# Patient Record
Sex: Male | Born: 1945 | Race: White | Hispanic: No | Marital: Married | Smoking: Former smoker
Health system: Southern US, Community
[De-identification: ages and names within clinical notes are randomized; demographics above are authoritative.]

## PROBLEM LIST (undated history)

## (undated) DIAGNOSIS — J309 Allergic rhinitis, unspecified: Secondary | ICD-10-CM

## (undated) DIAGNOSIS — G20 Parkinson's disease: Secondary | ICD-10-CM

## (undated) DIAGNOSIS — K439 Ventral hernia without obstruction or gangrene: Secondary | ICD-10-CM

## (undated) DIAGNOSIS — F32A Depression, unspecified: Secondary | ICD-10-CM

## (undated) DIAGNOSIS — I639 Cerebral infarction, unspecified: Secondary | ICD-10-CM

## (undated) DIAGNOSIS — I2510 Atherosclerotic heart disease of native coronary artery without angina pectoris: Secondary | ICD-10-CM

## (undated) DIAGNOSIS — E7800 Pure hypercholesterolemia, unspecified: Secondary | ICD-10-CM

## (undated) DIAGNOSIS — G20C Parkinsonism, unspecified: Secondary | ICD-10-CM

## (undated) DIAGNOSIS — R413 Other amnesia: Secondary | ICD-10-CM

## (undated) DIAGNOSIS — K219 Gastro-esophageal reflux disease without esophagitis: Secondary | ICD-10-CM

## (undated) DIAGNOSIS — E291 Testicular hypofunction: Secondary | ICD-10-CM

## (undated) DIAGNOSIS — G5790 Unspecified mononeuropathy of unspecified lower limb: Secondary | ICD-10-CM

## (undated) DIAGNOSIS — F329 Major depressive disorder, single episode, unspecified: Secondary | ICD-10-CM

## (undated) DIAGNOSIS — M1990 Unspecified osteoarthritis, unspecified site: Secondary | ICD-10-CM

## (undated) DIAGNOSIS — H47011 Ischemic optic neuropathy, right eye: Secondary | ICD-10-CM

## (undated) DIAGNOSIS — N400 Enlarged prostate without lower urinary tract symptoms: Secondary | ICD-10-CM

## (undated) DIAGNOSIS — I10 Essential (primary) hypertension: Secondary | ICD-10-CM

## (undated) DIAGNOSIS — G20A1 Parkinson's disease without dyskinesia, without mention of fluctuations: Secondary | ICD-10-CM

## (undated) HISTORY — DX: Parkinson's disease: G20

## (undated) HISTORY — DX: Allergic rhinitis, unspecified: J30.9

## (undated) HISTORY — DX: Ischemic optic neuropathy, right eye: H47.011

## (undated) HISTORY — DX: Pure hypercholesterolemia, unspecified: E78.00

## (undated) HISTORY — DX: Parkinson's disease without dyskinesia, without mention of fluctuations: G20.A1

## (undated) HISTORY — DX: Testicular hypofunction: E29.1

## (undated) HISTORY — DX: Essential (primary) hypertension: I10

## (undated) HISTORY — DX: Atherosclerotic heart disease of native coronary artery without angina pectoris: I25.10

## (undated) HISTORY — DX: Gastro-esophageal reflux disease without esophagitis: K21.9

## (undated) HISTORY — DX: Major depressive disorder, single episode, unspecified: F32.9

## (undated) HISTORY — DX: Benign prostatic hyperplasia without lower urinary tract symptoms: N40.0

## (undated) HISTORY — DX: Unspecified mononeuropathy of unspecified lower limb: G57.90

## (undated) HISTORY — DX: Unspecified osteoarthritis, unspecified site: M19.90

## (undated) HISTORY — DX: Depression, unspecified: F32.A

## (undated) HISTORY — DX: Ventral hernia without obstruction or gangrene: K43.9

## (undated) HISTORY — DX: Parkinsonism, unspecified: G20.C

## (undated) HISTORY — DX: Other amnesia: R41.3

---

## 2001-02-26 HISTORY — PX: CORONARY STENT PLACEMENT: SHX1402

## 2008-12-28 ENCOUNTER — Encounter: Admission: RE | Admit: 2008-12-28 | Discharge: 2009-02-23 | Payer: Self-pay | Admitting: Family Medicine

## 2009-02-26 HISTORY — PX: SHOULDER SURGERY: SHX246

## 2009-03-07 ENCOUNTER — Ambulatory Visit (HOSPITAL_COMMUNITY): Admission: RE | Admit: 2009-03-07 | Discharge: 2009-03-07 | Payer: Self-pay | Admitting: Orthopedic Surgery

## 2009-04-11 ENCOUNTER — Encounter: Payer: Self-pay | Admitting: Cardiovascular Disease

## 2010-01-11 ENCOUNTER — Emergency Department (HOSPITAL_COMMUNITY): Admission: EM | Admit: 2010-01-11 | Discharge: 2010-01-11 | Payer: Self-pay | Admitting: Emergency Medicine

## 2010-03-27 ENCOUNTER — Telehealth (INDEPENDENT_AMBULATORY_CARE_PROVIDER_SITE_OTHER): Payer: Self-pay | Admitting: *Deleted

## 2010-04-03 ENCOUNTER — Encounter: Payer: Self-pay | Admitting: Cardiovascular Disease

## 2010-04-03 DIAGNOSIS — E7800 Pure hypercholesterolemia, unspecified: Secondary | ICD-10-CM | POA: Insufficient documentation

## 2010-04-03 DIAGNOSIS — K449 Diaphragmatic hernia without obstruction or gangrene: Secondary | ICD-10-CM | POA: Insufficient documentation

## 2010-04-03 DIAGNOSIS — I2510 Atherosclerotic heart disease of native coronary artery without angina pectoris: Secondary | ICD-10-CM | POA: Insufficient documentation

## 2010-04-03 DIAGNOSIS — I10 Essential (primary) hypertension: Secondary | ICD-10-CM | POA: Insufficient documentation

## 2010-04-03 DIAGNOSIS — K439 Ventral hernia without obstruction or gangrene: Secondary | ICD-10-CM | POA: Insufficient documentation

## 2010-04-03 DIAGNOSIS — E1149 Type 2 diabetes mellitus with other diabetic neurological complication: Secondary | ICD-10-CM | POA: Insufficient documentation

## 2010-04-03 DIAGNOSIS — K219 Gastro-esophageal reflux disease without esophagitis: Secondary | ICD-10-CM | POA: Insufficient documentation

## 2010-04-03 DIAGNOSIS — J45909 Unspecified asthma, uncomplicated: Secondary | ICD-10-CM | POA: Insufficient documentation

## 2010-04-03 DIAGNOSIS — R413 Other amnesia: Secondary | ICD-10-CM | POA: Insufficient documentation

## 2010-04-03 DIAGNOSIS — N400 Enlarged prostate without lower urinary tract symptoms: Secondary | ICD-10-CM | POA: Insufficient documentation

## 2010-04-03 DIAGNOSIS — M1990 Unspecified osteoarthritis, unspecified site: Secondary | ICD-10-CM | POA: Insufficient documentation

## 2010-04-04 ENCOUNTER — Encounter: Payer: Self-pay | Admitting: Cardiovascular Disease

## 2010-04-04 ENCOUNTER — Ambulatory Visit (INDEPENDENT_AMBULATORY_CARE_PROVIDER_SITE_OTHER): Payer: Medicare Other | Admitting: Cardiovascular Disease

## 2010-04-04 DIAGNOSIS — I10 Essential (primary) hypertension: Secondary | ICD-10-CM

## 2010-04-04 DIAGNOSIS — E785 Hyperlipidemia, unspecified: Secondary | ICD-10-CM

## 2010-04-04 DIAGNOSIS — I2510 Atherosclerotic heart disease of native coronary artery without angina pectoris: Secondary | ICD-10-CM

## 2010-04-05 NOTE — Progress Notes (Signed)
  ROI faxed to [facility] @ [phone] attn Suella Broad Mesiemore  March 27, 2010 10:42 AM     Appended Document:  Records received back form [facility] gave to [place]

## 2010-04-10 ENCOUNTER — Telehealth (INDEPENDENT_AMBULATORY_CARE_PROVIDER_SITE_OTHER): Payer: Self-pay | Admitting: *Deleted

## 2010-04-11 ENCOUNTER — Encounter: Payer: Self-pay | Admitting: Cardiology

## 2010-04-11 ENCOUNTER — Ambulatory Visit (HOSPITAL_COMMUNITY): Payer: Medicare Other | Attending: Cardiovascular Disease

## 2010-04-11 DIAGNOSIS — I2510 Atherosclerotic heart disease of native coronary artery without angina pectoris: Secondary | ICD-10-CM

## 2010-04-13 ENCOUNTER — Telehealth (INDEPENDENT_AMBULATORY_CARE_PROVIDER_SITE_OTHER): Payer: Self-pay | Admitting: *Deleted

## 2010-04-13 NOTE — Assessment & Plan Note (Signed)
Summary: CARDIAC EVAL/ PT WIFE IS A PT OF Joseandres Mazer/ CT   History of Present Illness: Referred from Dr Clelia Croft at [facility].  Reviewed extensive records from there.  Admitted 2005 for angina.  Had cypher DES to LAD.  No ETT since.  CRF;s include borderline DM,HTN.  previous smoker.  Has had some exertional dyspnea and tightness going up hill.  Somewhat worse over last 6 months.  Prediabetic and needs to decrease carbs and loose weight.  No recent A1c.  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  Will ask research nurses to see for Reveal trial as HDL has been as low as 20.  Denies palpitations, edema, syncope.  Compliant with meds.  Has had memory issues the last ferw years and MRI showed atrophy only.  Memory difficulty obvious during exam.  LV function normal in past not recently looked at    He is on simvastatin 80 mg and we will change this to Pravastatin given FDA warning.  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 Take One Tablet By Mouth Daily 2)  Plavix 75 Mg Tabs (Clopidogrel Bisulfate) .... Take One Tablet By Mouth Daily 3)  Metoprolol Succinate 50 Mg Xr24h-Tab (Metoprolol Succinate) .... Take One Tablet By Mouth Daily 4)  Nitrostat 0.4 Mg Subl (Nitroglycerin) .Marland Kitchen.. 1 Tablet Under Tongue At Onset of Chest Pain; You May Repeat Every 5 Minutes For Up To 3 Doses. 5)  Lipitor 40 Mg Tabs (Atorvastatin Calcium) .... Take One Tablet By Mouth Daily. 6)  Ramipril 10 Mg Caps (Ramipril) .... 2 Tabs By Mouth  Once Daily 7)  Acid Reducer 75 Mg Tabs (Ranitidine Hcl) .... As Needed 8)  Fexofenadine Hcl 180 Mg Tabs (Fexofenadine Hcl) .Marland Kitchen.. 1  Tab By Mouth Once Daily 9)  Hydralazine Hcl 50 Mg Tabs (Hydralazine Hcl) .... 2 Tabs By Mouth Every Evening At Bedtime  Allergies (verified): 1)  ! Lipitor 2)  ! Hydrocodone 3)  ! Morphine  Past History:  Past Medical History: Last updated: April 14, 2010 CAD HYPERCHOLESTEROLEMIA  HYPERTENSION  DIABETES MELLITUS, BORDERLINE BENIGN PROSTATIC HYPERTROPHY, WITH OBSTRUCTION  DEGENERATIVE JOINT DISEASE  ASTHMA, CHILDHOOD  MEMORY LOSS  VENTRAL HERNIA  HIATAL HERNIA GERD   Past Surgical History: Last updated: 04/14/10 s/p stenting  Family History: Last updated: 14-Apr-2010 Father: deceased respiratory arrest, valvular heart arrest  Social History: Last updated: 04-14-10 Tobacco Use - Former.  Retired  Married  Regular Exercise - yes  Review of Systems       Denies fever, malais, weight loss, blurry vision, decreased visual acuity, cough, sputum,  hemoptysis, pleuritic pain, palpitaitons, heartburn, abdominal pain, melena, lower extremity edema, claudication, or rash.   Vital Signs:  Patient profile:   65 year old male Height:      69 inches Weight:      219 pounds BMI:     32.46 Pulse rate:   81 / minute Resp:     14 per minute BP sitting:   135 / 86  (left arm)  Vitals Entered By: Kem Parkinson (April 04, 2010 3:20 PM)  Physical Exam  General:  Affect appropriate Healthy:  appears stated age HEENT: normal Neck supple with no adenopathy JVP normal no bruits no thyromegaly Lungs clear with no wheezing and good diaphragmatic motion Heart:  S1/S2 no murmur,rub, gallop or click PMI normal Abdomen: benighn, BS positve, no tenderness, no AAA no bruit.  No HSM or HJR Distal pulses intact with no bruits No edema Neuro non-focal Skin warm and dry Ventral Hernia   Impression & Recommendations:  Problem # 1:  CAD  (ICD-414.00) Exertional dyspnea in diabetic with stenting LAD 7 years ago.  F/U stress myovue His updated medication list for this problem includes:    Aspirin 81 Mg Tbec (Aspirin) .Marland Kitchen... Take one tablet by mouth daily    Plavix 75 Mg Tabs (Clopidogrel bisulfate) .Marland Kitchen... Take one tablet by mouth daily    Metoprolol Succinate 50 Mg Xr24h-tab (Metoprolol succinate) .Marland Kitchen... Take one tablet by mouth daily    Nitrostat 0.4 Mg Subl (Nitroglycerin) .Marland Kitchen... 1 tablet under tongue at onset of chest pain; you may repeat every 5 minutes for up to 3 doses.    Ramipril 10 Mg Caps (Ramipril) .Marland Kitchen... 2 tabs by mouth once daily  Orders: Nuclear Stress Test (Nuc Stress Test)  Problem # 2:  HYPERCHOLESTEROLEMIA (ICD-272.0) Start generic lipitor 40 given FDA warnings on high dose simvastatin. Especially if thinking of Reveal trial and dual Rx His updated medication list for this problem includes:    Lipitor 40 Mg Tabs (Atorvastatin calcium) .Marland Kitchen... Take one tablet by mouth daily.  Problem # 3:  DIABETES MELLITUS, BORDERLINE (ICD-790.29) F/U primary  Discussed low carb [place] type diet  Problem # 4:  HYPERTENSION (ICD-401.9)  Well controlled His updated medication list for this problem includes:    Aspirin 81 Mg Tbec (Aspirin) .Marland Kitchen... Take one tablet by mouth daily    Metoprolol Succinate 50 Mg Xr24h-tab (Metoprolol succinate) .Marland Kitchen... Take one tablet by mouth daily    Ramipril 10 Mg Caps (Ramipril) .Marland Kitchen... 2 tabs by mouth once daily    Hydralazine Hcl 50 Mg Tabs (Hydralazine hcl) .Marland Kitchen... 2 tabs by mouth every evening at bedtime  His updated medication list for this problem includes:    Aspirin 81 Mg Tbec (Aspirin) .Marland Kitchen... Take one tablet by mouth daily    Metoprolol Succinate 50 Mg Xr24h-tab (Metoprolol succinate) .Marland Kitchen... Take one tablet by mouth daily    Ramipril 10 Mg Caps (Ramipril) .Marland Kitchen... 2 tabs by mouth once daily    Hydralazine Hcl 50 Mg Tabs (Hydralazine hcl) .Marland Kitchen... 2 tabs by mouth every evening at bedtime  Patient  Instructions: 1)  Your physician wants you to follow-up in:6 MONTHS  You will receive a reminder letter in the mail two months in advance. If you don't receive a letter, please call our office to schedule the follow-up appointment. 2)  Your physician has requested that you have an exercise stress myoview.  For further information please visit [email or web address]  Please follow instruction sheet, as given. 3)  Your physician has recommended you make the following change in your medication: STOP SIMVASTATIN 4)  START LIPITOR 40MG  ONCE DAILY Prescriptions: LIPITOR 40 MG TABS (ATORVASTATIN CALCIUM) Take one tablet by mouth daily.  #30 x 12   Entered by:   Deliah Goody, RN   Authorized by:   Colon Branch, MD, [facility]   Signed by:   Deliah Goody, RN on 04/04/2010   Method used:   Electronically to  [facility] [address] [phone]* (retail)       [address], [place]  [address]       Ph: [phone]       Fax: [phone]   RxID:   [id number]    EKG Report  Procedure date:  04/04/2010  Findings:      NSR  PAC Otherwise normal

## 2010-04-13 NOTE — Letter (Signed)
Summary: Physicians Documentation Sheet  Physicians Documentation Sheet   Imported By: Erle Crocker 04/03/2010 15:33:05  _____________________________________________________________________  External Attachment:    Type:   Image     Comment:   External Document

## 2010-04-13 NOTE — Letter (Signed)
Summary: [facility] [phone]  [facility] 2005-2011   Imported By: Marylou Mccoy 04/04/2010 09:04:15  _____________________________________________________________________  External Attachment:    Type:   Image     Comment:   External Document

## 2010-04-19 NOTE — Progress Notes (Signed)
Summary: nuc pre procedure  Phone Note Outgoing Call Call back at Home Phone [phone]   Call placed by: Cathlyn Parsons RN,  April 10, 2010 11:00 AM Call placed to: Patient Reason for Call: Confirm/change Appt Summary of Call: Left message with information on Myoview Information Sheet (see scanned document for details).      Nuclear Med Background Indications for Stress Test: Evaluation for Ischemia, Stent Patency   History: Angioplasty, Asthma, Heart Catheterization, Stents  History Comments: 05 Cath with PTCA/stent of LAD([facility])  Symptoms: Chest Tightness with Exertion, DOE    Nuclear Pre-Procedure Cardiac Risk Factors: History of Smoking, Hypertension, Lipids Height (in): 69

## 2010-04-19 NOTE — Assessment & Plan Note (Signed)
Summary: Cardiology Nuclear Testing  Nuclear Med Background Indications for Stress Test: Evaluation for Ischemia, Stent Patency   History: Angioplasty, Asthma, GXT, Heart Catheterization, Stents  History Comments: '05 PTCA/Stent-LAD   Symptoms Comments: No cardiac complaints   Nuclear Pre-Procedure Cardiac Risk Factors: History of Smoking, Hypertension, Lipids, Obesity Caffeine/Decaff Intake: None NPO After: 10:00 PM Lungs: Clear IV 0.9% NS with Angio Cath: 18g     IV Site: R Antecubital IV Started by: Stanton Kidney, EMT-P Chest Size (in) 42     Height (in): 69 Weight (lb): 216 BMI: 32.01 Tech Comments: Metoprolol not held per MD.  Nuclear Med Study 1 or 2 day study:  1 day     Stress Test Type:  Stress Reading MD:  Olga Millers, MD     Referring MD:  Charlton Haws, MD Resting Radionuclide:  Technetium 22m Tetrofosmin     Resting Radionuclide Dose:  11.0 mCi  Stress Radionuclide:  Technetium 51m Tetrofosmin     Stress Radionuclide Dose:  33.0 mCi   Stress Protocol Exercise Time (min):  8:15 min     Max HR:  137 bpm     Predicted Max HR:  155 bpm  Max Systolic BP: 193 mm Hg     Percent Max HR:  88.39 %     METS: 10.1 Rate Pressure Product:  04540    Stress Test Technologist:  [employer]a [employer], CMA-N     Nuclear Technologist:  Domenic Polite, CNMT  Rest Procedure  Myocardial perfusion imaging was performed at rest 45 minutes following the intravenous administration of Technetium 5m Tetrofosmin.  Stress Procedure  The patient exercised for 8:15.  The patient stopped due to fatigue and denied any chest pain.  There were no significant ST-T wave changes, only occasional PVC's with rare couplets and PAC's.  Technetium 65m Tetrofosmin was injected at peak exercise and myocardial perfusion imaging was performed after a brief delay.  QPS Raw Data Images:  Acquisition technically good; normal left ventricular size. Stress Images:  Normal homogeneous uptake in all areas of the  myocardium. Rest Images:  Normal homogeneous uptake in all areas of the myocardium. Subtraction (SDS):  No evidence of ischemia. Transient Ischemic Dilatation:  0.94  (Normal <1.22)  Lung/Heart Ratio:  0.37  (Normal <0.45)  Quantitative Gated Spect Images QGS EDV:  82 ml QGS ESV:  29 ml QGS EF:  65 % QGS cine images:  Normal wall motion.   Overall Impression  Exercise Capacity: Fair exercise capacity. BP Response: Normal blood pressure response. Clinical Symptoms: No chest pain ECG Impression: No significant ST segment change suggestive of ischemia. Overall Impression: Normal stress nuclear study with no ischemia or infarction.  Appended Document: Cardiology Nuclear Testing normal nuclear study  Appended Document: Cardiology Nuclear Testing pt aware of results

## 2010-04-25 NOTE — Progress Notes (Signed)
  Phone Note Outgoing Call   Call placed by: Deliah Goody, RN,  April 13, 2010 4:51 PM Summary of Call: called to tell pt about myoview results and he reports bp rinning 150's /90's consistantly. meds were confirmed. will foward for dr Eden Emms review Deliah Goody, RN  April 13, 2010 4:52 PM\par  Follow-up for Phone Call        Add HCTZ 25 mg and F/U BMET in 3 weeks Follow-up by: Colon Branch, MD, [facility],  April 17, 2010 11:57 PM  Additional Follow-up for Phone Call Additional follow up Details #1::        Left message to call back Deliah Goody, RN  April 19, 2010 8:25 AM  Pt returning call [phone] Judie Grieve  April 19, 2010 12:15 PM  pt aware of new med and the need to increase the potassium rich foods in diet. he will have labs checked in 3 weeks Deliah Goody, RN  April 19, 2010 2:05 PM     New/Updated Medications: HYDROCHLOROTHIAZIDE 25 MG TABS (HYDROCHLOROTHIAZIDE) Take one tablet by mouth daily. Prescriptions: HYDROCHLOROTHIAZIDE 25 MG TABS (HYDROCHLOROTHIAZIDE) Take one tablet by mouth daily.  #30 x 12   Entered by:   Deliah Goody, RN   Authorized by:   Colon Branch, MD, [facility]   Signed by:   Deliah Goody, RN on 04/19/2010   Method used:   Electronically to        [facility] [phone]* (retail)       [address] [place]  [address]       Ph: [phone]       Fax: [phone]   RxID:   [id number]

## 2010-05-08 ENCOUNTER — Other Ambulatory Visit (INDEPENDENT_AMBULATORY_CARE_PROVIDER_SITE_OTHER): Payer: Medicare Other

## 2010-05-08 ENCOUNTER — Other Ambulatory Visit: Payer: Self-pay | Admitting: Cardiology

## 2010-05-08 ENCOUNTER — Encounter: Payer: Self-pay | Admitting: Cardiology

## 2010-05-08 DIAGNOSIS — I10 Essential (primary) hypertension: Secondary | ICD-10-CM | POA: Insufficient documentation

## 2010-05-08 DIAGNOSIS — Z79899 Other long term (current) drug therapy: Secondary | ICD-10-CM

## 2010-05-08 LAB — BASIC METABOLIC PANEL
BUN: 17 mg/dL (ref 6–23)
CO2: 27 mEq/L (ref 19–32)
Calcium: 9.2 mg/dL (ref 8.4–10.5)
GFR: 69.86 mL/min (ref >60.00)
Glucose, Bld: 170 mg/dL — ABNORMAL HIGH (ref 70–99)

## 2010-05-14 LAB — PROTIME-INR
INR: 0.96 (ref 0.00–1.49)
Prothrombin Time: 12.7 seconds (ref 11.6–15.2)

## 2010-05-14 LAB — CBC
HCT: 44.3 % (ref 39.0–52.0)
MCV: 93.2 fL (ref 78.0–100.0)
Platelets: 212 10*3/uL (ref 150–400)
RDW: 12.5 % (ref 11.5–15.5)
WBC: 6.2 10*3/uL (ref 4.0–10.5)

## 2010-05-14 LAB — BASIC METABOLIC PANEL
BUN: 14 mg/dL (ref 6–23)
Creatinine, Ser: 0.91 mg/dL (ref 0.4–1.5)
GFR calc non Af Amer: >60 mL/min (ref >60)
Glucose, Bld: 118 mg/dL — ABNORMAL HIGH (ref 70–99)

## 2010-05-14 LAB — DIFFERENTIAL
Basophils Absolute: 0 10*3/uL (ref 0.0–0.1)
Eosinophils Absolute: 0.2 10*3/uL (ref 0.0–0.7)
Eosinophils Relative: 3 % (ref 0–5)
Lymphs Abs: 1.4 10*3/uL (ref 0.7–4.0)
Neutrophils Relative %: 64 % (ref 43–77)

## 2010-05-14 LAB — URINALYSIS, ROUTINE W REFLEX MICROSCOPIC
Bilirubin Urine: NEGATIVE
Glucose, UA: >1000 mg/dL — AB
Hgb urine dipstick: NEGATIVE
Ketones, ur: NEGATIVE mg/dL
Protein, ur: NEGATIVE mg/dL
Urobilinogen, UA: 0.2 mg/dL (ref 0.0–1.0)

## 2010-05-14 LAB — URINE MICROSCOPIC-ADD ON

## 2010-06-20 ENCOUNTER — Encounter: Payer: Self-pay | Admitting: Cardiovascular Disease

## 2010-06-21 ENCOUNTER — Ambulatory Visit (INDEPENDENT_AMBULATORY_CARE_PROVIDER_SITE_OTHER): Payer: Medicare Other | Admitting: Cardiovascular Disease

## 2010-06-21 ENCOUNTER — Encounter: Payer: Self-pay | Admitting: Cardiovascular Disease

## 2010-06-21 VITALS — BP 113/79 | HR 78 | Resp 17 | Ht 70.0 in | Wt 220.0 lb

## 2010-06-21 DIAGNOSIS — E7800 Pure hypercholesterolemia, unspecified: Secondary | ICD-10-CM

## 2010-06-21 MED ORDER — ATORVASTATIN CALCIUM 40 MG PO TABS: 40.0000 mg | ORAL_TABLET | Freq: Every day | ORAL | Status: DC

## 2010-06-21 NOTE — Assessment & Plan Note (Signed)
Stable no angina Myovue normal 2/12

## 2010-06-21 NOTE — Assessment & Plan Note (Signed)
Well controlled.  Continue current medications and low sodium [place] type diet.    

## 2010-06-21 NOTE — Assessment & Plan Note (Signed)
Cholesterol is at goal.  Continue current dose of statin and diet Rx.  No myalgias or side effects.  F/U  LFT's in 6 months. No results found for this basename: LDLCALC             

## 2010-06-21 NOTE — Patient Instructions (Signed)
Your physician wants you to follow-up in: 6 MONTHS.  You will receive a reminder letter in the mail two months in advance. If you don't receive a letter, please call our office to schedule the follow-up appointment.  Your physician recommends that you continue on your current medications as directed. Please refer to the Current Medication list given to you today.  

## 2010-06-21 NOTE — Progress Notes (Signed)
Referred from Dr Clelia Croft at [facility].  Reviewed extensive records from there.  Admitted 2005 for angina.  Had cypher DES to LAD.  No ETT since.  CRF;s include borderline DM,HTN.  previous smoker.  Has had some exertional dyspnea and tightness going up hill.  Somewhat worse over last 6 months.  Prediabetic and needs to decrease carbs and loose weight.  No recent A1c.  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  Will ask research nurses to see for Reveal trial as HDL has been as low as 20.  Denies palpitations, edema, syncope.  Compliant with meds.  Has had memory issues the last ferw years and MRI showed atrophy only.  Memory difficulty obvious during exam.   Nuclear study done 2/12 normal with good EF Tolerating lipitor.  W/U for parkinsons and memory loss ongoing.  Had MRI results pending  ROS: Denies fever, malais, weight loss, blurry vision, decreased visual acuity, cough, sputum, SOB, hemoptysis, pleuritic pain, palpitaitons, heartburn, abdominal pain, melena, lower extremity edema, claudication, or rash.   General: Affect appropriate Healthy:  appears stated age HEENT: normal Neck supple with no adenopathy JVP normal no bruits no thyromegaly Lungs clear with no wheezing and good diaphragmatic motion Heart:  S1/S2 no murmur,rub, gallop or click PMI normal Abdomen: benighn, BS positve, no tenderness, no AAA no bruit.  No HSM or HJR Distal pulses intact with no bruits No edema Neuro non-focal Skin warm and dry No muscular weakness   Current Outpatient Prescriptions  Medication Sig Dispense Refill  . aspirin 81 MG tablet Take 81 mg by mouth daily.        Marland Kitchen atorvastatin (LIPITOR) 40 MG tablet Take 40 mg by mouth daily.        . clopidogrel (PLAVIX) 75 MG tablet Take 75 mg by mouth daily.        . DULoxetine (CYMBALTA) 60 MG capsule Take 60 mg by mouth daily.        . fexofenadine (ALLEGRA) 180 MG tablet Take 180 mg by mouth daily.        . hydrALAZINE (APRESOLINE) 50 MG tablet Take 100  mg by mouth at bedtime.        . hydrochlorothiazide 25 MG tablet Take 25 mg by mouth daily.        . metoprolol (TOPROL-XL) 50 MG 24 hr tablet Take 50 mg by mouth daily.        . nitroGLYCERIN (NITROSTAT) 0.4 MG SL tablet Place 0.4 mg under the tongue every 5 (five) minutes as needed.        . ramipril (ALTACE) 10 MG capsule Take 10 mg by mouth daily.        . ranitidine (ZANTAC) 75 MG tablet Take 75 mg by mouth as needed.          Allergies  Atorvastatin; Hydrocodone; and Morphine  Electrocardiogram:  Assessment and Plan

## 2010-06-21 NOTE — Assessment & Plan Note (Signed)
F/U with neurology to review MRI.  Suspect this will be progressive

## 2010-10-25 ENCOUNTER — Other Ambulatory Visit: Payer: Self-pay | Admitting: *Deleted

## 2010-10-25 MED ORDER — RAMIPRIL 10 MG PO CAPS: 10.0000 mg | ORAL_CAPSULE | Freq: Every day | ORAL | Status: DC

## 2010-10-25 MED ORDER — RAMIPRIL 10 MG PO CAPS: 20.0000 mg | ORAL_CAPSULE | Freq: Every day | ORAL | Status: DC

## 2010-10-27 ENCOUNTER — Other Ambulatory Visit: Payer: Self-pay | Admitting: *Deleted

## 2010-10-27 MED ORDER — RAMIPRIL 10 MG PO CAPS: 20.0000 mg | ORAL_CAPSULE | Freq: Every day | ORAL | Status: DC

## 2010-11-25 ENCOUNTER — Emergency Department (HOSPITAL_COMMUNITY)
Admission: EM | Admit: 2010-11-25 | Discharge: 2010-11-26 | Disposition: A | Discharge status: Home / Self Care | Payer: Medicare Other | Attending: Emergency Medicine | Admitting: Emergency Medicine

## 2010-11-25 ENCOUNTER — Emergency Department (HOSPITAL_COMMUNITY): Payer: Medicare Other

## 2010-11-25 DIAGNOSIS — E7800 Pure hypercholesterolemia, unspecified: Secondary | ICD-10-CM | POA: Insufficient documentation

## 2010-11-25 DIAGNOSIS — S2249XA Multiple fractures of ribs, unspecified side, initial encounter for closed fracture: Secondary | ICD-10-CM | POA: Insufficient documentation

## 2010-11-25 DIAGNOSIS — IMO0002 Reserved for concepts with insufficient information to code with codable children: Secondary | ICD-10-CM | POA: Insufficient documentation

## 2010-11-25 DIAGNOSIS — R10814 Left lower quadrant abdominal tenderness: Secondary | ICD-10-CM | POA: Insufficient documentation

## 2010-11-25 DIAGNOSIS — I10 Essential (primary) hypertension: Secondary | ICD-10-CM | POA: Insufficient documentation

## 2010-11-25 DIAGNOSIS — Z79899 Other long term (current) drug therapy: Secondary | ICD-10-CM | POA: Insufficient documentation

## 2010-11-25 DIAGNOSIS — Y92410 Unspecified street and highway as the place of occurrence of the external cause: Secondary | ICD-10-CM | POA: Insufficient documentation

## 2010-11-25 DIAGNOSIS — R1032 Left lower quadrant pain: Secondary | ICD-10-CM | POA: Insufficient documentation

## 2010-11-25 LAB — DIFFERENTIAL
Basophils Absolute: 0 10*3/uL (ref 0.0–0.1)
Eosinophils Relative: 0 % (ref 0–5)
Lymphocytes Relative: 7 % — ABNORMAL LOW (ref 12–46)
Lymphs Abs: 1 10*3/uL (ref 0.7–4.0)
Neutro Abs: 11.9 10*3/uL — ABNORMAL HIGH (ref 1.7–7.7)
Neutrophils Relative %: 84 % — ABNORMAL HIGH (ref 43–77)

## 2010-11-25 LAB — CBC
HCT: 41.5 % (ref 39.0–52.0)
Hemoglobin: 14.9 g/dL (ref 13.0–17.0)
MCV: 90.4 fL (ref 78.0–100.0)
RBC: 4.59 MIL/uL (ref 4.22–5.81)
RDW: 12.7 % (ref 11.5–15.5)
WBC: 14.2 10*3/uL — ABNORMAL HIGH (ref 4.0–10.5)

## 2010-11-25 LAB — TYPE AND SCREEN

## 2010-11-25 LAB — BASIC METABOLIC PANEL
CO2: 26 mEq/L (ref 19–32)
Calcium: 9.2 mg/dL (ref 8.4–10.5)
Potassium: 3.4 mEq/L — ABNORMAL LOW (ref 3.5–5.1)
Sodium: 130 mEq/L — ABNORMAL LOW (ref 135–145)

## 2010-11-25 LAB — PROTIME-INR: INR: 1.03 (ref 0.00–1.49)

## 2010-11-25 MED ORDER — IOHEXOL 300 MG/ML  SOLN: 100.0000 mL | Freq: Once | INTRAMUSCULAR | Status: AC | PRN

## 2010-11-26 LAB — ABO/RH: ABO/RH(D): A POS

## 2010-12-19 ENCOUNTER — Encounter: Payer: Self-pay | Admitting: Cardiovascular Disease

## 2010-12-19 ENCOUNTER — Ambulatory Visit (INDEPENDENT_AMBULATORY_CARE_PROVIDER_SITE_OTHER): Payer: Medicare Other | Admitting: Cardiovascular Disease

## 2010-12-19 DIAGNOSIS — I2510 Atherosclerotic heart disease of native coronary artery without angina pectoris: Secondary | ICD-10-CM

## 2010-12-19 DIAGNOSIS — R7309 Other abnormal glucose: Secondary | ICD-10-CM

## 2010-12-19 DIAGNOSIS — E7800 Pure hypercholesterolemia, unspecified: Secondary | ICD-10-CM

## 2010-12-19 DIAGNOSIS — I10 Essential (primary) hypertension: Secondary | ICD-10-CM

## 2010-12-19 DIAGNOSIS — R413 Other amnesia: Secondary | ICD-10-CM
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
## 2010-12-19 NOTE — Assessment & Plan Note (Signed)
Well controlled.  Continue current medications and low sodium [place] type diet.    

## 2010-12-19 NOTE — Assessment & Plan Note (Signed)
Labs today consider referral for low HDL.  Continue statin

## 2010-12-19 NOTE — Assessment & Plan Note (Signed)
Continue F/U with [facility].  R[place]ole.  Memory good today with limited signs of parkinsons

## 2010-12-19 NOTE — Assessment & Plan Note (Signed)
Continue bid glucophage.  Has room to improve diet Low carb [employer] discussed

## 2010-12-19 NOTE — Assessment & Plan Note (Signed)
Stable with no angina and good activity level.  Continue medical Rx  

## 2010-12-19 NOTE — Patient Instructions (Addendum)
Your physician wants you to follow-up in:  6 MONTHS WITH DR NISHAN  You will receive a reminder letter in the mail two months in advance. If you don't receive a letter, please call our office to schedule the follow-up appointment. Your physician recommends that you continue on your current medications as directed. Please refer to the Current Medication list given to you today. 

## 2010-12-26 ENCOUNTER — Encounter: Payer: Self-pay | Admitting: Cardiovascular Disease

## 2011-04-04 ENCOUNTER — Telehealth: Payer: Self-pay | Admitting: Cardiovascular Disease

## 2011-04-04 NOTE — Telephone Encounter (Signed)
New problem Pt is having tooth extracted and he wants to know if he can come off plavix. Please call

## 2011-04-04 NOTE — Telephone Encounter (Signed)
Ok per Dr Eden Emms for Jeremy Terry to come off his Plavix for 5 days prior to his tooth extraction.

## 2011-04-04 NOTE — Telephone Encounter (Signed)
Pt was notified.  

## 2011-05-06 ENCOUNTER — Other Ambulatory Visit: Payer: Self-pay | Admitting: Cardiovascular Disease

## 2011-05-07 ENCOUNTER — Other Ambulatory Visit: Payer: Self-pay

## 2011-05-07 MED ORDER — HYDROCHLOROTHIAZIDE 25 MG PO TABS: 25.0000 mg | ORAL_TABLET | Freq: Every day | ORAL | Status: DC

## 2011-06-03 ENCOUNTER — Other Ambulatory Visit: Payer: Self-pay | Admitting: Cardiovascular Disease

## 2011-06-28 ENCOUNTER — Telehealth: Payer: Self-pay | Admitting: Cardiovascular Disease

## 2011-06-28 NOTE — Telephone Encounter (Signed)
New Problem:     I called the patient and was unable to reach them. I left a message on their voicemail with my name, the reason I called, the name of his physician, and a number to call back to schedule their appointment. 

## 2011-07-04 ENCOUNTER — Other Ambulatory Visit: Payer: Self-pay | Admitting: Cardiovascular Disease

## 2011-08-07 ENCOUNTER — Other Ambulatory Visit: Payer: Self-pay | Admitting: Cardiovascular Disease

## 2011-08-07 ENCOUNTER — Ambulatory Visit (INDEPENDENT_AMBULATORY_CARE_PROVIDER_SITE_OTHER): Payer: Medicare Other | Admitting: Cardiovascular Disease

## 2011-08-07 ENCOUNTER — Encounter: Payer: Self-pay | Admitting: Cardiovascular Disease

## 2011-08-07 VITALS — BP 130/80 | HR 81 | Ht 69.0 in | Wt 212.0 lb

## 2011-08-07 DIAGNOSIS — E7800 Pure hypercholesterolemia, unspecified: Secondary | ICD-10-CM

## 2011-08-07 DIAGNOSIS — I10 Essential (primary) hypertension: Secondary | ICD-10-CM

## 2011-08-07 DIAGNOSIS — I2510 Atherosclerotic heart disease of native coronary artery without angina pectoris: Secondary | ICD-10-CM

## 2011-08-07 DIAGNOSIS — R413 Other amnesia: Secondary | ICD-10-CM

## 2011-08-07 MED ORDER — NITROGLYCERIN 0.4 MG SL SUBL: 0.4000 mg | SUBLINGUAL_TABLET | SUBLINGUAL | Status: AC | PRN

## 2011-08-07 NOTE — Patient Instructions (Signed)
Your physician recommends that you continue on your current medications as directed. Please refer to the Current Medication list given to you today.  Your physician recommends that you schedule a follow-up appointment in: 3 months with Dr.Nishan  

## 2011-08-07 NOTE — Assessment & Plan Note (Signed)
Well controlled.  Continue current medications and low sodium [place] type diet.    

## 2011-08-07 NOTE — Assessment & Plan Note (Signed)
Stable with no angina and good activity level.  Continue medical Rx Nitro called in One episode pain with stomach ache.  No exertional symptoms

## 2011-08-07 NOTE — Progress Notes (Signed)
Patient ID: Jeremy Terry, male   DOB: November 13, 1945, 66 y.o.   MRN: [id number] Referred from Dr Jeremy Terry at [facility]. Reviewed extensive records from there. Admitted 2005 for angina. Had cypher DES to LAD. No ETT since. CRF;s include borderline DM,HTN. previous smoker. Has had some exertional dyspnea and tightness going up hill. Somewhat worse over last 6 months. Prediabetic and needs to decrease carbs and loose weight. No recent A1c. 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 Will ask research nurses to see for Reveal trial as HDL has been as low as 20. Denies palpitations, edema, syncope. Compliant with meds. Has had memory issues the last ferw years and MRI showed atrophy only. Memory difficulty obvious during exam. Nuclear study done 2/12 normal with good EF Tolerating lipitor. W/U for parkinsons and memory loss ongoing. Had MRI results pending  Primary is Jeremy Terry.  Reviewed chol.  HDL low at 26 and LDL 46  Would be a good candidate for Accelerate  ROS: Denies fever, malais, weight loss, blurry vision, decreased visual acuity, cough, sputum, SOB, hemoptysis, pleuritic pain, palpitaitons, heartburn, abdominal pain, melena, lower extremity edema, claudication, or rash.  All other systems reviewed and negative  General: Affect appropriate Healthy:  appears stated age HEENT: normal Neck supple with no adenopathy JVP normal no bruits no thyromegaly Lungs clear with no wheezing and good diaphragmatic motion Heart:  S1/S2 no murmur, no rub, gallop or click PMI normal Abdomen: benighn, BS positve, no tenderness, no AAA no bruit.  No HSM or HJR Distal pulses intact with no bruits No edema Neuro non-focal Skin warm and dry No muscular weakness   Current Outpatient Prescriptions  Medication Sig Dispense Refill  . aspirin 81 MG tablet Take 81 mg by mouth daily.        Marland Kitchen atorvastatin (LIPITOR) 40 MG tablet TAKE ONE TABLET BY MOUTH DAILY  30 tablet  10  . clopidogrel (PLAVIX) 75 MG tablet Take 75 mg by  mouth daily.        Marland Kitchen donepezil (ARICEPT) 5 MG tablet Take 5 mg by mouth at bedtime as needed.      . DULoxetine (CYMBALTA) 60 MG capsule Take 60 mg by mouth daily.        . fexofenadine (ALLEGRA) 180 MG tablet Take 180 mg by mouth daily.        . hydrochlorothiazide (HYDRODIURIL) 25 MG tablet TAKE 1 TABLET BY MOUTH DAILY  30 tablet  0  . hydrOXYzine (ATARAX/VISTARIL) 25 MG tablet Take 25 mg by mouth daily.        Marland Kitchen losartan (COZAAR) 100 MG tablet Take 100 mg by mouth daily.      . metFORMIN (GLUCOPHAGE) 500 MG tablet Take 1,000 mg by mouth 2 (two) times daily with a meal.       . metoprolol (TOPROL-XL) 50 MG 24 hr tablet Take 50 mg by mouth daily.        . Multiple Vitamin (MULTIVITAMIN) capsule Take 1 capsule by mouth daily.        . nitroGLYCERIN (NITROSTAT) 0.4 MG SL tablet Place 0.4 mg under the tongue every 5 (five) minutes as needed.        . ranitidine (ZANTAC) 75 MG tablet Take 75 mg by mouth as needed.        Marland Kitchen rOPINIRole (REQUIP) 1 MG tablet Take 1 mg by mouth 3 (three) times daily.          Allergies  Atorvastatin; Hydrocodone; and Morphine  Electrocardiogram:  NSR  rate 81  [id number]  Assessment and Plan

## 2011-08-07 NOTE — Assessment & Plan Note (Signed)
LDL is fine.  Research to talk to about Accelerate since HDL so low

## 2011-08-07 NOTE — Assessment & Plan Note (Signed)
F/U neuro.  Stable but noticeable.

## 2011-08-24 ENCOUNTER — Telehealth: Payer: Self-pay | Admitting: Cardiovascular Disease

## 2011-08-24 MED ORDER — LOSARTAN POTASSIUM 100 MG PO TABS: 100.0000 mg | ORAL_TABLET | Freq: Every day | ORAL | Status: DC

## 2011-08-24 NOTE — Telephone Encounter (Signed)
Spoke to patient's wife she stated patient needs samples losartan.Wife was told will dont get samples of generic medicine.Lorsartan refill sent to [facility] in [place].Spoke to pharmacist at [facility] she stated patient picked up losartan on 08/19/11 and says he lost it.Pharmacist will refill but patient will have to pay cash insurance want pay too early.Patient's wife called and was told losartan prescription sent in can pick up this afternoon.

## 2011-08-24 NOTE — Telephone Encounter (Signed)
Please return call to patient at [phone]  Regarding Losartan sample meds.

## 2011-09-04 ENCOUNTER — Other Ambulatory Visit: Payer: Self-pay | Admitting: Cardiovascular Disease

## 2011-11-02 ENCOUNTER — Ambulatory Visit: Payer: Medicare Other | Attending: Diagnostic Neuroimaging | Admitting: Physical Therapy

## 2011-11-02 DIAGNOSIS — G20A1 Parkinson's disease without dyskinesia, without mention of fluctuations: Secondary | ICD-10-CM | POA: Insufficient documentation

## 2011-11-02 DIAGNOSIS — R269 Unspecified abnormalities of gait and mobility: Secondary | ICD-10-CM | POA: Insufficient documentation

## 2011-11-02 DIAGNOSIS — IMO0001 Reserved for inherently not codable concepts without codable children: Secondary | ICD-10-CM | POA: Insufficient documentation

## 2011-11-02 DIAGNOSIS — G20 Parkinson's disease: Secondary | ICD-10-CM | POA: Insufficient documentation

## 2011-11-06 ENCOUNTER — Ambulatory Visit: Payer: Medicare Other | Admitting: Physical Therapy

## 2011-11-13 ENCOUNTER — Encounter: Payer: Self-pay | Admitting: Cardiovascular Disease

## 2011-11-13 ENCOUNTER — Ambulatory Visit (INDEPENDENT_AMBULATORY_CARE_PROVIDER_SITE_OTHER): Payer: Medicare Other | Admitting: Cardiovascular Disease

## 2011-11-13 VITALS — BP 108/68 | HR 72 | Ht 69.0 in | Wt 215.5 lb

## 2011-11-13 DIAGNOSIS — G20 Parkinson's disease: Secondary | ICD-10-CM

## 2011-11-13 DIAGNOSIS — I10 Essential (primary) hypertension: Secondary | ICD-10-CM

## 2011-11-13 DIAGNOSIS — E7800 Pure hypercholesterolemia, unspecified: Secondary | ICD-10-CM

## 2011-11-13 DIAGNOSIS — I2510 Atherosclerotic heart disease of native coronary artery without angina pectoris: Secondary | ICD-10-CM

## 2011-11-13 MED ORDER — CARBIDOPA-LEVODOPA CR 25-100 MG PO TBCR: 1.0000 | EXTENDED_RELEASE_TABLET | Freq: Three times a day (TID) | ORAL | Status: DC

## 2011-11-13 NOTE — Assessment & Plan Note (Signed)
Well controlled.  Continue current medications and low sodium [place] type diet.    

## 2011-11-13 NOTE — Patient Instructions (Signed)
Your physician wants you to follow-up in:  6 MONTHS WITH DR NISHAN  You will receive a reminder letter in the mail two months in advance. If you don't receive a letter, please call our office to schedule the follow-up appointment. Your physician recommends that you continue on your current medications as directed. Please refer to the Current Medication list given to you today. 

## 2011-11-13 NOTE — Assessment & Plan Note (Signed)
Stable with no angina and good activity level.  Continue medical Rx  

## 2011-11-13 NOTE — Progress Notes (Signed)
Patient ID: Jeremy Terry, male   DOB: Aug 12, 1945, 66 y.o.   MRN: [id number] Referred from Dr Clelia Croft at [facility]. Reviewed extensive records from there. Admitted 2005 for angina. Had cypher DES to LAD. No ETT since. CRF;s include borderline DM,HTN. previous smoker. Has had some exertional dyspnea and tightness going up hill. Somewhat worse over last 6 months. Prediabetic and needs to decrease carbs and loose weight. No recent A1c. 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 Will ask research nurses to see for Reveal trial as HDL has been as low as 20. Denies palpitations, edema, syncope. Compliant with meds. Has had memory issues the last ferw years and MRI showed atrophy only. Memory difficulty obvious during exam. Nuclear study done 2/12 normal with good EF Tolerating lipitor. W/U for parkinsons and memory loss ongoing.  Primary is [name].  Reviewed chol. HDL low at 26 and LDL 46  In Accelerate trial   ROS: Denies fever, malais, weight loss, blurry vision, decreased visual acuity, cough, sputum, SOB, hemoptysis, pleuritic pain, palpitaitons, heartburn, abdominal pain, melena, lower extremity edema, claudication, or rash.  All other systems reviewed and negative  General: Affect appropriate Healthy:  appears stated age HEENT: normal Neck supple with no adenopathy JVP normal no bruits no thyromegaly Lungs clear with no wheezing and good diaphragmatic motion Heart:  S1/S2 no murmur, no rub, gallop or click PMI normal Abdomen: benighn, BS positve, no tenderness, no AAA no bruit.  No HSM or HJR Distal pulses intact with no bruits No edema Neuro non-focal parkinsons tremor in hands Skin warm and dry No muscular weakness   Current Outpatient Prescriptions  Medication Sig Dispense Refill  . aspirin 81 MG tablet Take 81 mg by mouth daily.        Marland Kitchen atorvastatin (LIPITOR) 40 MG tablet TAKE ONE TABLET BY MOUTH DAILY  30 tablet  10  . clopidogrel (PLAVIX) 75 MG tablet Take 75 mg by mouth daily.         Marland Kitchen donepezil (ARICEPT) 5 MG tablet Take 5 mg by mouth at bedtime as needed.      . DULoxetine (CYMBALTA) 60 MG capsule Take 60 mg by mouth daily.        . fexofenadine (ALLEGRA) 180 MG tablet Take 180 mg by mouth daily.        . hydrochlorothiazide (HYDRODIURIL) 25 MG tablet TAKE 1 TABLET BY MOUTH DAILY  30 tablet  0  . hydrOXYzine (ATARAX/VISTARIL) 25 MG tablet Take 25 mg by mouth daily.        Marland Kitchen losartan (COZAAR) 100 MG tablet Take 1 tablet (100 mg total) by mouth daily.  30 tablet  6  . metFORMIN (GLUCOPHAGE) 500 MG tablet Take 1,000 mg by mouth 2 (two) times daily with a meal.       . metoprolol (TOPROL-XL) 50 MG 24 hr tablet Take 50 mg by mouth daily.        . Multiple Vitamin (MULTIVITAMIN) capsule Take 1 capsule by mouth daily.        . nitroGLYCERIN (NITROSTAT) 0.4 MG SL tablet Place 1 tablet (0.4 mg total) under the tongue every 5 (five) minutes as needed.  25 tablet  11  . ranitidine (ZANTAC) 75 MG tablet Take 75 mg by mouth as needed.        Marland Kitchen rOPINIRole (REQUIP) 1 MG tablet Take 1 mg by mouth 3 (three) times daily.        . carbidopa-levodopa (SINEMET CR) 25-100 MG per tablet Take 1  tablet by mouth 3 (three) times daily.        Allergies  Atorvastatin; Hydrocodone; and Morphine  Electrocardiogram:  08/07/11  SR rate 81 ICRBBB  Assessment and Plan

## 2011-11-13 NOTE — Assessment & Plan Note (Signed)
On sinemet  Going to PT/OT  offerred him referral to Dr Tat in future if needed

## 2011-11-13 NOTE — Assessment & Plan Note (Signed)
Cholesterol is at goal.  Continue current dose of statin and diet Rx.  No myalgias or side effects.  F/U  LFT's in 6 months. No results found for this basename: LDLCALC   In Accerlerate Trial

## 2011-11-15 ENCOUNTER — Ambulatory Visit: Payer: Medicare Other | Admitting: Physical Therapy

## 2011-11-20 ENCOUNTER — Ambulatory Visit: Payer: Medicare Other | Admitting: Physical Therapy

## 2011-11-26 ENCOUNTER — Ambulatory Visit: Payer: Medicare Other | Admitting: Physical Therapy

## 2011-11-29 ENCOUNTER — Ambulatory Visit: Payer: Medicare Other | Attending: Diagnostic Neuroimaging | Admitting: Physical Therapy

## 2011-11-29 DIAGNOSIS — G20 Parkinson's disease: Secondary | ICD-10-CM | POA: Insufficient documentation

## 2011-11-29 DIAGNOSIS — R269 Unspecified abnormalities of gait and mobility: Secondary | ICD-10-CM | POA: Insufficient documentation

## 2011-11-29 DIAGNOSIS — IMO0001 Reserved for inherently not codable concepts without codable children: Secondary | ICD-10-CM | POA: Insufficient documentation

## 2011-11-29 DIAGNOSIS — G20A1 Parkinson's disease without dyskinesia, without mention of fluctuations: Secondary | ICD-10-CM | POA: Insufficient documentation

## 2011-12-03 ENCOUNTER — Ambulatory Visit: Payer: Medicare Other | Admitting: Physical Therapy

## 2011-12-05 ENCOUNTER — Ambulatory Visit: Payer: Medicare Other | Admitting: Physical Therapy

## 2011-12-10 ENCOUNTER — Ambulatory Visit: Payer: Medicare Other | Admitting: Physical Therapy

## 2011-12-12 ENCOUNTER — Ambulatory Visit: Payer: Medicare Other | Admitting: Physical Therapy

## 2011-12-17 ENCOUNTER — Ambulatory Visit: Payer: Medicare Other | Admitting: Physical Therapy

## 2011-12-19 ENCOUNTER — Telehealth: Payer: Self-pay | Admitting: Cardiovascular Disease

## 2011-12-19 ENCOUNTER — Ambulatory Visit: Payer: Medicare Other | Admitting: *Deleted

## 2011-12-19 MED ORDER — CLOPIDOGREL BISULFATE 75 MG PO TABS: 75.0000 mg | ORAL_TABLET | Freq: Every day | ORAL | Status: DC

## 2011-12-19 NOTE — Telephone Encounter (Signed)
New Problem: ° ° ° °Patient called in needing a refill of his clopidogrel (PLAVIX) 75 MG tablet. °

## 2012-01-14 ENCOUNTER — Ambulatory Visit: Payer: Medicare Other | Attending: Diagnostic Neuroimaging | Admitting: Occupational Therapy

## 2012-01-14 DIAGNOSIS — R269 Unspecified abnormalities of gait and mobility: Secondary | ICD-10-CM | POA: Insufficient documentation

## 2012-01-14 DIAGNOSIS — G20 Parkinson's disease: Secondary | ICD-10-CM | POA: Insufficient documentation

## 2012-01-14 DIAGNOSIS — G20A1 Parkinson's disease without dyskinesia, without mention of fluctuations: Secondary | ICD-10-CM | POA: Insufficient documentation

## 2012-01-14 DIAGNOSIS — IMO0001 Reserved for inherently not codable concepts without codable children: Secondary | ICD-10-CM | POA: Insufficient documentation

## 2012-01-22 ENCOUNTER — Ambulatory Visit: Payer: Medicare Other | Admitting: Occupational Therapy

## 2012-01-22 ENCOUNTER — Ambulatory Visit: Payer: Medicare Other | Admitting: Physical Therapy

## 2012-01-23 ENCOUNTER — Other Ambulatory Visit: Payer: Self-pay

## 2012-01-23 ENCOUNTER — Ambulatory Visit: Payer: Medicare Other | Admitting: Physical Therapy

## 2012-01-23 MED ORDER — METOPROLOL SUCCINATE ER 50 MG PO TB24: 50.0000 mg | ORAL_TABLET | Freq: Every day | ORAL | Status: DC

## 2012-01-28 ENCOUNTER — Ambulatory Visit: Payer: Medicare Other | Attending: Diagnostic Neuroimaging | Admitting: Occupational Therapy

## 2012-01-28 DIAGNOSIS — IMO0001 Reserved for inherently not codable concepts without codable children: Secondary | ICD-10-CM | POA: Insufficient documentation

## 2012-01-28 DIAGNOSIS — G20A1 Parkinson's disease without dyskinesia, without mention of fluctuations: Secondary | ICD-10-CM | POA: Insufficient documentation

## 2012-01-28 DIAGNOSIS — G20 Parkinson's disease: Secondary | ICD-10-CM | POA: Insufficient documentation

## 2012-01-28 DIAGNOSIS — R269 Unspecified abnormalities of gait and mobility: Secondary | ICD-10-CM | POA: Insufficient documentation

## 2012-02-04 ENCOUNTER — Ambulatory Visit: Payer: Medicare Other | Admitting: Occupational Therapy

## 2012-02-06 ENCOUNTER — Ambulatory Visit: Payer: Medicare Other | Admitting: Occupational Therapy

## 2012-02-11 ENCOUNTER — Ambulatory Visit: Payer: Medicare Other | Admitting: Occupational Therapy

## 2012-02-13 ENCOUNTER — Ambulatory Visit: Payer: Medicare Other | Admitting: Occupational Therapy

## 2012-02-14 ENCOUNTER — Other Ambulatory Visit: Payer: Self-pay | Admitting: Cardiovascular Disease

## 2012-02-14 MED ORDER — HYDROCHLOROTHIAZIDE 25 MG PO TABS: 25.0000 mg | ORAL_TABLET | Freq: Every day | ORAL | Status: DC

## 2012-04-10 ENCOUNTER — Encounter: Payer: Self-pay | Admitting: *Deleted

## 2012-05-22 ENCOUNTER — Other Ambulatory Visit: Payer: Self-pay | Admitting: *Deleted

## 2012-05-22 MED ORDER — ATORVASTATIN CALCIUM 40 MG PO TABS: ORAL_TABLET | ORAL | Status: DC

## 2012-06-10 ENCOUNTER — Ambulatory Visit: Payer: Medicare Other | Admitting: Physical Therapy

## 2012-06-23 ENCOUNTER — Ambulatory Visit: Payer: Medicare Other | Admitting: Occupational Therapy

## 2012-06-23 ENCOUNTER — Ambulatory Visit: Payer: Medicare Other | Attending: Diagnostic Neuroimaging | Admitting: Physical Therapy

## 2012-06-23 DIAGNOSIS — G20 Parkinson's disease: Secondary | ICD-10-CM | POA: Insufficient documentation

## 2012-06-23 DIAGNOSIS — R269 Unspecified abnormalities of gait and mobility: Secondary | ICD-10-CM | POA: Insufficient documentation

## 2012-06-23 DIAGNOSIS — G20A1 Parkinson's disease without dyskinesia, without mention of fluctuations: Secondary | ICD-10-CM | POA: Insufficient documentation

## 2012-06-23 DIAGNOSIS — IMO0001 Reserved for inherently not codable concepts without codable children: Secondary | ICD-10-CM | POA: Insufficient documentation

## 2012-06-26 ENCOUNTER — Ambulatory Visit: Payer: Medicare Other | Attending: Diagnostic Neuroimaging | Admitting: Physical Therapy

## 2012-06-26 DIAGNOSIS — G20 Parkinson's disease: Secondary | ICD-10-CM | POA: Insufficient documentation

## 2012-06-26 DIAGNOSIS — G20A1 Parkinson's disease without dyskinesia, without mention of fluctuations: Secondary | ICD-10-CM | POA: Insufficient documentation

## 2012-06-26 DIAGNOSIS — IMO0001 Reserved for inherently not codable concepts without codable children: Secondary | ICD-10-CM | POA: Insufficient documentation

## 2012-06-26 DIAGNOSIS — R269 Unspecified abnormalities of gait and mobility: Secondary | ICD-10-CM | POA: Insufficient documentation

## 2012-06-30 ENCOUNTER — Ambulatory Visit: Payer: Medicare Other | Admitting: Physical Therapy

## 2012-07-02 ENCOUNTER — Ambulatory Visit: Payer: Medicare Other | Admitting: Physical Therapy

## 2012-07-09 ENCOUNTER — Ambulatory Visit: Payer: Medicare Other | Admitting: Physical Therapy

## 2012-07-11 ENCOUNTER — Ambulatory Visit: Payer: Medicare Other | Admitting: Physical Therapy

## 2012-07-14 ENCOUNTER — Telehealth: Payer: Self-pay | Admitting: Diagnostic Neuroimaging

## 2012-07-14 ENCOUNTER — Ambulatory Visit: Payer: Medicare Other | Admitting: Physical Therapy

## 2012-07-14 NOTE — Telephone Encounter (Signed)
Spouse says pt is buying unusual things impulsively. Spoke w/ Dr. Marjory Lies, advised to schedule this week. Agreed to 5/21 @ 930, but called back to reschedule to NP and MD overbooked. Unable to reach spouse. Will try later.

## 2012-07-15 ENCOUNTER — Telehealth: Payer: Self-pay

## 2012-07-15 NOTE — Telephone Encounter (Signed)
Spoke to spouse. Was able to sched appt for 10:30 instead of 9:30 on 07/16/12.

## 2012-07-16 ENCOUNTER — Ambulatory Visit (INDEPENDENT_AMBULATORY_CARE_PROVIDER_SITE_OTHER): Payer: Medicare Other | Admitting: Diagnostic Neuroimaging

## 2012-07-16 ENCOUNTER — Encounter: Payer: Self-pay | Admitting: Diagnostic Neuroimaging

## 2012-07-16 ENCOUNTER — Other Ambulatory Visit: Payer: Self-pay | Admitting: *Deleted

## 2012-07-16 ENCOUNTER — Ambulatory Visit: Payer: Medicare Other | Admitting: Physical Therapy

## 2012-07-16 VITALS — BP 115/74 | HR 73 | Ht 70.0 in | Wt 226.0 lb

## 2012-07-16 DIAGNOSIS — G20A1 Parkinson's disease without dyskinesia, without mention of fluctuations: Secondary | ICD-10-CM

## 2012-07-16 DIAGNOSIS — G20 Parkinson's disease: Secondary | ICD-10-CM

## 2012-07-16 NOTE — Progress Notes (Addendum)
[facility]  PATIENT: Jeremy Terry DOB: 1945-10-28  REFERRING CLINICIAN:  HISTORY FROM: patient and wife REASON FOR VISIT: urgent visit   HISTORICAL  CHIEF COMPLAINT:  Chief Complaint  Patient presents with  . Follow-up    med adjustment #6    HISTORY OF PRESENT ILLNESS:   UPDATE 07/16/12: Since last visit, at which time I increased carbidopa/levodopa as dosing, patient has started to have increasing spending habits. Patient's wife is very concerned. In retrospect this may have been going on for the past one year. However the last 3 months he has had significant increase focus on purchases, most of which are "unnecessary" according to the wife. This is starting to involve larger purchases such as considering buying a new car. Patient acknowledges that he is having some difficulty with these behaviors. I discussed possibility of ropinirole as a contributing factor to this. In terms of Parkinson's symptoms, overall his tremor, balance and gait are stable.  UPDATE 04/28/12: Since last visit, more falling, wearing off, fatigue, tremor. Reports weakness in legs. Falling happens when climbing up and down from truck bed and tractor. No falls when walking opn the ground.  UPDATE 01/22/12: Did better with carb/levo initially, but now noticing some waering off. No peak dyskinesias. No constipation or sleep problems. Some drooling and anxiety. Gait is better with PT.  UPDATE 10/16/11: Still on ropinirole 1mg  TID; did well for several months after last visit; in last 2 months now having more tremor, balance and gait diff.   UPDATE 04/01/10:  Increase in bilateral hand tremors LUE > RUE currently taking ropinirole 1mg  TID, took ropinirole 2mg  TID x 1 day with symptoms of paleness, dizziness, and unable to stand. Remained off for 1 week then increased to 1.5mg  with symptoms of upset stomach so went back down to 1mg  TID.  Patient has been ill with Bronchitis x 3 weeks and patient and  wife have an increase in his tremors.  Gait is more slow and gets up slowly per wife.   Does have to hold cup sometimes with both hands. Also having more issues with forgetfulness.  Denies problems with ADL's.  Wife reports symptoms are not as bad as they have been.  They have not noticed an increase in tremors before or after medications.     UPDATE 11/22/10: No improvement on ropinirole 1mg  TID.  Motor sxs stable.  Some incoordination, esp with driving (weaving on road).  Also with increased sweating (drenched) and fatigue.  Poor sleep due to freq urination.  UPDATE 10/11/10: Doing better on ropinirole; tremor reduced, coordination and gait improved.  Tolerating meds. Now c/o memory diff, short term, anger mgmt issues.  UPDATE 07/17/10: Still with intermittent tremor, balance diff.  Testing reviewed.  PD treatment options reviewed.  PRIOR HPI (06/02/10): 67 year old right-handed male with history hypertension, hypercholesterolemia, coronary artery disease, depression, here for evaluation tremor since June 2011.  here with his wife.  Patient describes tremor in his right greater than left hand, and since June 2011. This is mainly when he is doing action such as using utensils, drinking from a glass, working on his tractor and on the farm.  Sometimes he has tremor at rest. He denies any change in smell or taste. Denies any vivid dreams or acting out dreams.  He denies any balance or walking difficulties. His wife has noticed that he tends to shuffle his feet.  His father had tremor, of unknown cause.  REVIEW OF SYSTEMS: Full 14 system review  of systems performed and notable only for weight gain swelling and legs blurred vision double vision snoring diarrhea moles easy bruising easy bleeding allergy runny nose memory loss confusion tremor depression and on asleep decreased energy insomnia sleepiness snoring.  ALLERGIES: Allergies  Allergen Reactions  . Atorvastatin   . Hydrocodone   . Morphine      HOME MEDICATIONS: Outpatient Prescriptions Prior to Visit  Medication Sig Dispense Refill  . rOPINIRole (REQUIP) 1 MG tablet Take 1 mg by mouth 3 (three) times daily.       Marland Kitchen aspirin 81 MG tablet Take 81 mg by mouth daily.        Marland Kitchen atorvastatin (LIPITOR) 40 MG tablet TAKE ONE TABLET BY MOUTH DAILY  30 tablet  10  . carbidopa-levodopa (SINEMET CR) 25-100 MG per tablet Take 1 tablet by mouth 3 (three) times daily.      . clopidogrel (PLAVIX) 75 MG tablet Take 1 tablet (75 mg total) by mouth daily.  30 tablet  12  . donepezil (ARICEPT) 5 MG tablet Take 5 mg by mouth at bedtime as needed.      . DULoxetine (CYMBALTA) 60 MG capsule Take 60 mg by mouth daily.        . fexofenadine (ALLEGRA) 180 MG tablet Take 180 mg by mouth daily.        . hydrochlorothiazide (HYDRODIURIL) 25 MG tablet Take 1 tablet (25 mg total) by mouth daily.  30 tablet  5  . hydrOXYzine (ATARAX/VISTARIL) 25 MG tablet Take 25 mg by mouth daily.        Marland Kitchen losartan (COZAAR) 100 MG tablet Take 1 tablet (100 mg total) by mouth daily.  30 tablet  6  . metFORMIN (GLUCOPHAGE) 500 MG tablet Take 1,000 mg by mouth 2 (two) times daily with a meal.       . metoprolol succinate (TOPROL-XL) 50 MG 24 hr tablet Take 1 tablet (50 mg total) by mouth daily.  30 tablet  6  . Multiple Vitamin (MULTIVITAMIN) capsule Take 1 capsule by mouth daily.        . nitroGLYCERIN (NITROSTAT) 0.4 MG SL tablet Place 1 tablet (0.4 mg total) under the tongue every 5 (five) minutes as needed.  25 tablet  11  . ranitidine (ZANTAC) 75 MG tablet Take 75 mg by mouth as needed.        . Testosterone (ANDROGEL PUMP) 20.25 MG/ACT (1.62%) GEL Place 1.62 % onto the skin daily.       No facility-administered medications prior to visit.    PAST MEDICAL HISTORY: Past Medical History  Diagnosis Date  . Coronary artery disease   . Hypertension   . Diabetes mellitus   . Hypercholesteremia   . BPH (benign prostatic hypertrophy)   . DJD (degenerative joint disease)    . Asthma   . Memory loss   . Ventral hernia   . GERD (gastroesophageal reflux disease)   . Allergic rhinitis   . Parkinsonian syndrome   . Hypogonadism male   . Neuropathy of leg     Bilateral  . Depression   . Parkinson disease     PAST SURGICAL HISTORY: Past Surgical History  Procedure Laterality Date  . Coronary stent placement  2003  . Shoulder surgery  2011    FAMILY HISTORY: Family History  Problem Relation Age of Onset  . Other Father     respiratory problems    SOCIAL HISTORY:  History   Social History  . Marital Status: Married  Spouse Name: N/A    Number of Children: 2  . Years of Education: 14   Occupational History  . retired    Social History Main Topics  . Smoking status: Former Smoker    Quit date: 02/26/1998  . Smokeless tobacco: Not on file  . Alcohol Use: No  . Drug Use: No  . Sexually Active: Not on file   Other Topics Concern  . Not on file   Social History Narrative   Pt lives at home with his spouse.   Caffeine Use: Does not consume     PHYSICAL EXAM  Filed Vitals:   07/16/12 1111  BP: 115/74  Pulse: 73  Height: 5\' 10"  (1.778 m)  Weight: 226 lb (102.513 kg)    Not recorded    Body mass index is 32.43 kg/(m^2).  EXAM: General: Patient is awake, alert and in no acute distress.  Well developed and groomed. MASKED FACIES. Neck: Neck is supple. Cardiovascular: Heart is regular rate and rhythm with no murmurs.  Neurologic Exam  Mental Status: Awake, alert. Language is fluent and comprehension intact. MMSE 26/30. Cranial Nerves: Pupils are equal and reactive to light.  Visual fields are full to confrontation.  Conjugate eye movements are full and symmetric.  Facial sensation and strength are symmetric.  Hearing is intact.  Palate elevated symmetrically and uvula is midline.  Shoulder shrug is symmetric.  Tongue is midline.  POSITIVE MYERSON'S. HOARSE VOICE. Motor: Normal bulk. REST TREMOR OF LUE > RUE.  MILD POSTURAL  AND ACTION TREMOR OF BILATERAL UPPER EXTREMITIES.  BUE COGWHEELING WITH CONTRALATERAL REINFORCEMENT.  MOD BRADY IN BUE AND BLE. Full strength in the upper and lower extremities.  No pronator drift. Sensory: Intact and symmetric to light touch. Coordination: No ataxia or dysmetria on finger-nose or rapid alternating movement testing. Gait and Station: STOOPED POSTURE, LUE TREMOR, MILD SHUFFLE WITH LEFT FOOT SCUFFING THE GROUND OCC.  Reflexes: Deep tendon reflexes in the upper and lower extremity are present and symmetric.   DIAGNOSTIC DATA (LABS, IMAGING, TESTING) - I reviewed patient records, labs, notes, testing and imaging myself where available.  Lab Results  Component Value Date   WBC 14.2* 11/25/2010   HGB 14.9 11/25/2010   HCT 41.5 11/25/2010   MCV 90.4 11/25/2010   PLT 226 11/25/2010      Component Value Date/Time   NA 130* 11/25/2010 2153   K 3.4* 11/25/2010 2153   CL 94* 11/25/2010 2153   CO2 26 11/25/2010 2153   GLUCOSE 307* 11/25/2010 2153   BUN 22 11/25/2010 2153   CREATININE 0.92 11/25/2010 2153   CALCIUM 9.2 11/25/2010 2153   GFRNONAA >60 11/25/2010 2153   GFRAA >60 11/25/2010 2153   No results found for this basename: CHOL, HDL, LDLCALC, LDLDIRECT, TRIG, CHOLHDL   No results found for this basename: HGBA1C   No results found for this basename: VITAMINB12   No results found for this basename: TSH     ASSESSMENT AND PLAN  67 y.o. year-old male with idiopathic parkinson's disease. Symptoms stable, but suboptimally controlled on carb/levo + ropinirole + azilect. Now with compulsive behaviors (shopping, spending money).   PLAN: 1. Taper ropinerole 1mg  tabs to off: 1/2 tablet TID x 1 week, then 1/2 tablet BID x 1 week, then 1/2 tablet daily x 1 week, then stop 2. referral to [facility] to evaluate for DBS eligibility 3. follow up in office in 2 months.   Suanne Marker, MD (with Heide Guile, NP)  07/16/2012, 4:43 PM Certified in Neurology,  Neurophysiology and Neuroimaging  [facility] [address] [place], [place] [address] [phone]

## 2012-07-16 NOTE — Patient Instructions (Addendum)
Taper Ropinerol (Requip) to off.  Take 1/2 tablet 3 times a day for 1 week, then 1/2 tablet 2 times a day for 1 week, then 1/2 tablet once a day for 1 week, then stop.  Continue taking Carbodopa/Levodopa Sinemet CR) as directed.

## 2012-07-18 ENCOUNTER — Ambulatory Visit: Payer: Medicare Other | Admitting: Physical Therapy

## 2012-07-22 ENCOUNTER — Telehealth: Payer: Self-pay | Admitting: Family Medicine

## 2012-07-22 ENCOUNTER — Ambulatory Visit: Payer: Medicare Other | Admitting: Physical Therapy

## 2012-07-23 MED ORDER — GLUCOSE BLOOD VI STRP: ORAL_STRIP | Status: DC

## 2012-07-23 NOTE — Telephone Encounter (Signed)
Rx Refilled  

## 2012-07-24 ENCOUNTER — Ambulatory Visit: Payer: Medicare Other | Admitting: Physical Therapy

## 2012-07-25 ENCOUNTER — Telehealth: Payer: Self-pay | Admitting: Family Medicine

## 2012-07-25 DIAGNOSIS — G20 Parkinson's disease: Secondary | ICD-10-CM

## 2012-07-25 NOTE — Telephone Encounter (Signed)
Ok referral made 

## 2012-07-28 ENCOUNTER — Ambulatory Visit: Payer: Medicare Other | Attending: Diagnostic Neuroimaging | Admitting: Physical Therapy

## 2012-07-28 DIAGNOSIS — G20A1 Parkinson's disease without dyskinesia, without mention of fluctuations: Secondary | ICD-10-CM | POA: Insufficient documentation

## 2012-07-28 DIAGNOSIS — IMO0001 Reserved for inherently not codable concepts without codable children: Secondary | ICD-10-CM | POA: Insufficient documentation

## 2012-07-28 DIAGNOSIS — R269 Unspecified abnormalities of gait and mobility: Secondary | ICD-10-CM | POA: Insufficient documentation

## 2012-07-28 DIAGNOSIS — G20 Parkinson's disease: Secondary | ICD-10-CM | POA: Insufficient documentation

## 2012-07-30 ENCOUNTER — Ambulatory Visit: Payer: Medicare Other | Admitting: Physical Therapy

## 2012-08-04 ENCOUNTER — Telehealth: Payer: Self-pay | Admitting: Family Medicine

## 2012-08-04 ENCOUNTER — Ambulatory Visit: Payer: Medicare Other | Admitting: Physical Therapy

## 2012-08-04 MED ORDER — DONEPEZIL HCL 5 MG PO TABS: 5.0000 mg | ORAL_TABLET | Freq: Every evening | ORAL | Status: DC | PRN

## 2012-08-04 NOTE — Telephone Encounter (Signed)
Rx Refilled  

## 2012-08-06 ENCOUNTER — Ambulatory Visit: Payer: Medicare Other | Admitting: Physical Therapy

## 2012-08-08 ENCOUNTER — Encounter: Payer: Self-pay | Admitting: Neurology

## 2012-08-08 ENCOUNTER — Ambulatory Visit (INDEPENDENT_AMBULATORY_CARE_PROVIDER_SITE_OTHER): Payer: Medicare Other | Admitting: Neurology

## 2012-08-08 VITALS — BP 114/64 | HR 80 | Temp 98.0°F | Resp 16 | Wt 226.0 lb

## 2012-08-08 DIAGNOSIS — E1142 Type 2 diabetes mellitus with diabetic polyneuropathy: Secondary | ICD-10-CM

## 2012-08-08 DIAGNOSIS — G20 Parkinson's disease: Secondary | ICD-10-CM

## 2012-08-08 DIAGNOSIS — R413 Other amnesia: Secondary | ICD-10-CM

## 2012-08-08 DIAGNOSIS — H532 Diplopia: Secondary | ICD-10-CM

## 2012-08-08 DIAGNOSIS — E1149 Type 2 diabetes mellitus with other diabetic neurological complication: Secondary | ICD-10-CM

## 2012-08-08 DIAGNOSIS — R292 Abnormal reflex: Secondary | ICD-10-CM

## 2012-08-08 DIAGNOSIS — E1140 Type 2 diabetes mellitus with diabetic neuropathy, unspecified: Secondary | ICD-10-CM

## 2012-08-08 LAB — BASIC METABOLIC PANEL
BUN: 17 mg/dL (ref 6–23)
Calcium: 9.7 mg/dL (ref 8.4–10.5)
Chloride: 98 mEq/L (ref 96–112)
Creatinine, Ser: 0.9 mg/dL (ref 0.4–1.5)
GFR: 95.39 mL/min (ref >60.00)
Potassium: 3.9 mEq/L (ref 3.5–5.1)
Sodium: 138 mEq/L (ref 135–145)

## 2012-08-08 NOTE — Patient Instructions (Addendum)
1.  Start entacapone - 200 mg - 1 tablet with every dose of carbidopa/levodopa that you take 2  We will schedule your MRI brain  Your MRI is scheduled for Tuesday, June 17th at 3:00pm.   Please arrive to [facility], first floor admitting by 2:45pm.  [phone].    We will see you back in 8 weeks.

## 2012-08-08 NOTE — Progress Notes (Signed)
Jeremy Terry was seen today in the movement disorders clinic for neurologic consultation at the request of [facility] [name], MD.  He was previously seen by Dr. Marjory Lies.  They're requesting a second opinion regarding possible DBS surgery.  He has an appointment with [facility] in this regard as well.  The pt began with L hand tremor that began in 07/2009.  He was started on requip in 06/2010, but after 2 years on the medication, it was tapered off just 2 days ago because of compulsive spending.  When he was initally placed on the medication he had some nausea but his wife did think that it was effective. He has been on levodopa since 09/2011.  This was definitely effective.  He takes 1 1/2 tablets three to four times per day.  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  His wife notices that the medication may wear off about 30 minutes to one hour before the next dosage, as she will notice a resurgence of tremor.  He takes medication at 5 AM, 12 PM, often misses the 3 PM dose and then was taken another at 6 PM.  Specific Symptoms:  Tremor: yes (just recently started on the right) Voice: hypophonic more Sleep: trouble getting to sleep  Vivid Dreams:  no  Acting out dreams:  no (some jerking in the sleep and snores.  Never had a sleep study) Wet Pillows: no (not from drool, but from sweat) Postural symptoms:  yes  Falls?  no (rare) Bradykinesia symptoms: shuffling gait, slow and small handwriting, difficulty getting out of a chair and difficulty regaining balance Loss of smell:  no Loss of taste:  no Urinary Incontinence:  no (during day has urinary urgency, and very rarely has incontinence) Difficulty Swallowing:  no Handwriting, micrographia: yes Trouble with ADL's:  yes (able to do them, but is very slow)  Trouble buttoning clothing: yes (trouble with the ones at cuff) Depression:  no Memory changes:  yes  (litte  change, losing keys/telephone/wallet; has no difficulty driving in terms of memory, but is told that he will occasionally veer to the right) Personality changes:  Quick tempered and curses and never used to Hallucinations:  no  visual distortions: no Diplopia:  Yes, started after dx of PD, monocular, horizontal N/V:  no Lightheaded:  no  Syncope: no Diplopia:  yes Dyskinesia:  no  Neuroimaging has not previously been performed.   PREVIOUS MEDICATIONS: Requip (compulsive spending)  ALLERGIES:   Allergies  Allergen Reactions  . Hydrocodone   . Morphine     CURRENT MEDICATIONS:  Current Outpatient Prescriptions on File Prior to Visit  Medication Sig Dispense Refill  . aspirin 81 MG tablet Take 81 mg by mouth daily.        Marland Kitchen atorvastatin (LIPITOR) 40 MG tablet TAKE ONE TABLET BY MOUTH DAILY  30 tablet  10  . clopidogrel (PLAVIX) 75 MG tablet Take 1 tablet (75 mg total) by mouth daily.  30 tablet  12  . diphenoxylate-atropine (LOMOTIL) 2.5-0.025 MG per tablet       . donepezil (ARICEPT) 5 MG tablet Take 1 tablet (5 mg total) by mouth at bedtime as needed.  30 tablet  3  . DULoxetine (CYMBALTA) 60 MG capsule Take 60 mg by mouth daily.        . fexofenadine (ALLEGRA) 180 MG tablet Take 180 mg by mouth daily.        Marland Kitchen  glucose blood (FREESTYLE LITE) test strip Check blood sugar daily and prn - Dx:250.00  100 each  3  . hydrochlorothiazide (HYDRODIURIL) 25 MG tablet Take 1 tablet (25 mg total) by mouth daily.  30 tablet  5  . hydrOXYzine (ATARAX/VISTARIL) 25 MG tablet Take 25 mg by mouth daily.        Marland Kitchen losartan (COZAAR) 100 MG tablet Take 1 tablet (100 mg total) by mouth daily.  30 tablet  6  . metFORMIN (GLUCOPHAGE) 500 MG tablet Take 1,000 mg by mouth 2 (two) times daily with a meal.       . metoprolol succinate (TOPROL-XL) 50 MG 24 hr tablet Take 1 tablet (50 mg total) by mouth daily.  30 tablet  6  . Multiple Vitamin (MULTIVITAMIN) capsule Take 1 capsule by mouth daily.        .  nitroGLYCERIN (NITROSTAT) 0.4 MG SL tablet Place 1 tablet (0.4 mg total) under the tongue every 5 (five) minutes as needed.  25 tablet  11  . promethazine (PHENERGAN) 25 MG tablet       . [DISCONTINUED] rOPINIRole (REQUIP) 1 MG tablet Take 1 mg by mouth 3 (three) times daily.        No current facility-administered medications on file prior to visit.    PAST MEDICAL HISTORY:   Past Medical History  Diagnosis Date  . Coronary artery disease   . Hypertension   . Diabetes mellitus   . Hypercholesteremia   . BPH (benign prostatic hypertrophy)   . DJD (degenerative joint disease)   . Asthma   . Memory loss   . Ventral hernia   . GERD (gastroesophageal reflux disease)   . Allergic rhinitis   . Parkinsonian syndrome   . Hypogonadism male   . Neuropathy of leg     Bilateral  . Depression   . Parkinson disease     PAST SURGICAL HISTORY:   Past Surgical History  Procedure Laterality Date  . Coronary stent placement  2003  . Shoulder surgery  2011    rotator cuff    SOCIAL HISTORY:   History   Social History  . Marital Status: Married    Spouse Name: N/A    Number of Children: 2  . Years of Education: 14   Occupational History  . retired     Visual merchandiser   Social History Main Topics  . Smoking status: Former Smoker    Quit date: 02/26/1998  . Smokeless tobacco: Never Used  . Alcohol Use: No  . Drug Use: No  . Sexually Active: Not on file   Other Topics Concern  . Not on file   Social History Narrative   Pt lives at home with his spouse.   Caffeine Use: Does not consume    FAMILY HISTORY:   Family Status  Relation Status Death Age  . Father Deceased 25    CHF  . Mother Deceased     pneumonia  . Child Alive     2, healthy    ROS:  A complete 10 system review of systems was obtained and was unremarkable apart from what is mentioned above.  PHYSICAL EXAMINATION:    VITALS:   Filed Vitals:   08/08/12 1321  BP: 114/64  Pulse: 80  Temp: 98 F (36.7 C)   Resp: 16  Weight: 226 lb (102.513 kg)    GEN:  The patient appears stated age and is in NAD. HEENT:  Normocephalic, atraumatic.  The mucous membranes are  moist. The superficial temporal arteries are without ropiness or tenderness. CV:  RRR Lungs:  CTAB Neck/HEME:  There are no carotid bruits bilaterally.  Neurological examination:  Orientation: The patient is alert and oriented x3. Fund of knowledge is appropriate.  Recent and remote memory are intact.  Attention and concentration are normal.    Able to name objects and repeat phrases. Cranial nerves: There is good facial symmetry.  There is facial hypomimia.  Pupils are equal round and reactive to light bilaterally. Fundoscopic exam reveals clear margins bilaterally. Extraocular muscles are intact.  There are definite square wave jerks.  The visual fields are full to confrontational testing. The speech is fluent and clear.  At times, it becomes quite hypophonic and he has significant difficulty with the guttural sounds.  Soft palate rises symmetrically and there is no tongue deviation. Hearing is intact to conversational tone. Sensation: Sensation is intact to light and pinprick throughout (facial, trunk, extremities). Vibration is decreased in a distal fashion. There is no extinction with double simultaneous stimulation. There is no sensory dermatomal level identified. Motor: Strength is 5/5 in the bilateral upper and lower extremities.   Shoulder shrug is equal and symmetric.  There is no pronator drift. Deep tendon reflexes: Deep tendon reflexes are 3/4 at the bilateral biceps, triceps, brachioradialis, patella and achilles. Plantar response is upgoing on the left and downgoing on the right.  Movement examination: Tone: There is mildly increased tone in the bilateral upper extremities.  The tone in the lower extremities is normal.  Abnormal movements: There is a near constant moderate left upper extremity resting tremor and an intermittent  moderate right upper extremity resting tremor.  There is no tremor in the legs her head. Coordination:  There is some decremation with hand opening and closing on the left, but finger taps were good bilaterally.  He has difficulty with heel taps and toe taps bilaterally. Gait and Station: The patient has no difficulty arising out of a deep-seated chair without the use of the hands. The patient's stride length is normal, but the left upper extremity resting tremor does increase with ambulation.  The patient has a negative pull test.      ASSESSMENT/PLAN:  1.  Parkinsonism.  -He does have some atypical features that are worrisome for one of the atypical parkinsonian states.  He is hyperreflexic, has an upgoing toe on the right, has diplopia and has had personality changes.  I think that we need to do an MRI of the brain.  -I agree with the reduction/now-discontinuation of Requip due to compulsive spending.  -He is noticing wearing off of medication about 30 minutes to one hour prior to the next dosage.  I am going to add entacapone to each levodopa dosing.  He is currently taking carbidopa/levodopa 25/100, 1-1/2 tablets 3 times per day.  We may need to increase the overall dosage, but I would not recommend doing two changes at once.  -He asked me multiple questions about DBS and whether or not he is a candidate.  The most important factor on whether or not he is a candidate would be to solidify the diagnosis and make sure that he has idiopathic Parkinson's disease, as opposed to one of the atypical Parkinsonian syndromes.  This will take time in order to watch how the disease evolves.  In addition, he is not having motor fluctuations and has no side effects from the medication, so there is no reason that we cannot further optimize the levodopa.  Finally, he is on Aricept and although I did not find dementia today, he would need thorough neuropsychometric testing.   -He will continue to follow with physical  and occupational therapy [facility] the [facility]. 2.  peripheral neuropathy, likely secondary to diabetes.  -I. would like to get a copy of lab work from his primary care physician to make sure that he has had other lab work completed to rule out other reversible causes.  -We talked about safety. 3.  I gave the patient the option of returning to Dr. Marjory Lies or following up here.  He has opted to followup here.  As above, he also has an appointment at the movement disorder center at [facility], and I told him he should feel free to get an opinion narrative he would like.  I will plan on seeing him back in the next 8 weeks, sooner should new neurologic issues arise.  80 min face to face visit.

## 2012-08-11 ENCOUNTER — Ambulatory Visit: Payer: Medicare Other | Admitting: Physical Therapy

## 2012-08-12 ENCOUNTER — Ambulatory Visit (HOSPITAL_COMMUNITY)
Admission: RE | Admit: 2012-08-12 | Discharge: 2012-08-12 | Disposition: A | Discharge status: Home / Self Care | Payer: Medicare Other | Source: Ambulatory Visit | Attending: Neurology | Admitting: Neurology

## 2012-08-12 DIAGNOSIS — H547 Unspecified visual loss: Secondary | ICD-10-CM | POA: Insufficient documentation

## 2012-08-12 DIAGNOSIS — R259 Unspecified abnormal involuntary movements: Secondary | ICD-10-CM | POA: Insufficient documentation

## 2012-08-12 DIAGNOSIS — Z9181 History of falling: Secondary | ICD-10-CM | POA: Insufficient documentation

## 2012-08-12 DIAGNOSIS — G20 Parkinson's disease: Secondary | ICD-10-CM

## 2012-08-12 DIAGNOSIS — R413 Other amnesia: Secondary | ICD-10-CM

## 2012-08-12 DIAGNOSIS — H532 Diplopia: Secondary | ICD-10-CM

## 2012-08-12 MED ORDER — GADOBENATE DIMEGLUMINE 529 MG/ML IV SOLN
20.0000 mL | Freq: Once | INTRAVENOUS | Status: AC | PRN
  Administered 2012-08-12: 20 mL via INTRAVENOUS

## 2012-08-13 ENCOUNTER — Ambulatory Visit: Payer: Medicare Other | Admitting: Physical Therapy

## 2012-08-21 ENCOUNTER — Encounter: Payer: Self-pay | Admitting: Physician Assistant

## 2012-08-21 ENCOUNTER — Ambulatory Visit: Payer: Medicare Other | Admitting: Physical Therapy

## 2012-08-21 ENCOUNTER — Ambulatory Visit (INDEPENDENT_AMBULATORY_CARE_PROVIDER_SITE_OTHER): Payer: Medicare Other | Admitting: Physician Assistant

## 2012-08-21 VITALS — BP 118/70 | HR 84 | Temp 97.5°F | Resp 18 | Ht 67.0 in | Wt 224.0 lb

## 2012-08-21 DIAGNOSIS — D485 Neoplasm of uncertain behavior of skin: Secondary | ICD-10-CM

## 2012-08-21 DIAGNOSIS — T798XXA Other early complications of trauma, initial encounter: Secondary | ICD-10-CM

## 2012-08-21 DIAGNOSIS — L989 Disorder of the skin and subcutaneous tissue, unspecified: Secondary | ICD-10-CM

## 2012-08-21 DIAGNOSIS — L089 Local infection of the skin and subcutaneous tissue, unspecified: Secondary | ICD-10-CM

## 2012-08-21 MED ORDER — CEPHALEXIN 500 MG PO CAPS: 500.0000 mg | ORAL_CAPSULE | Freq: Four times a day (QID) | ORAL | Status: DC

## 2012-08-22 NOTE — Progress Notes (Signed)
Patient ID: Jeremy Terry MRN: [id number], DOB: Feb 25, 1946, 67 y.o. Date of Encounter: 08/22/2012, 10:47 AM    Chief Complaint:  Chief Complaint  Patient presents with  . infected spot on rt lower arm     HPI: 67 y.o. year old white male here with his wife. Says there has been a small papule on right forearm for many months but it had remained the same until about 3 weeks ago -it started to get larger and dev whitish area on top.   Got a scratch on left lower leg several days ago-a littile red around it now-wants that checked also.      Home Meds: See attached medication section for any medications that were entered at today's visit. The computer does not put those onto this list.The following list is a list of meds entered prior to today's visit.   Current Outpatient Prescriptions on File Prior to Visit  Medication Sig Dispense Refill  . aspirin 81 MG tablet Take 81 mg by mouth daily.        Marland Kitchen atorvastatin (LIPITOR) 40 MG tablet TAKE ONE TABLET BY MOUTH DAILY  30 tablet  10  . carbidopa-levodopa (SINEMET CR) 25-100 MG per tablet Take 1 tablet by mouth. One and half 4 times a day.      . clopidogrel (PLAVIX) 75 MG tablet Take 1 tablet (75 mg total) by mouth daily.  30 tablet  12  . diphenoxylate-atropine (LOMOTIL) 2.5-0.025 MG per tablet       . donepezil (ARICEPT) 5 MG tablet Take 1 tablet (5 mg total) by mouth at bedtime as needed.  30 tablet  3  . DULoxetine (CYMBALTA) 60 MG capsule Take 60 mg by mouth daily.        . fexofenadine (ALLEGRA) 180 MG tablet Take 180 mg by mouth daily.        Marland Kitchen glucose blood (FREESTYLE LITE) test strip Check blood sugar daily and prn - Dx:250.00  100 each  3  . hydrochlorothiazide (HYDRODIURIL) 25 MG tablet Take 1 tablet (25 mg total) by mouth daily.  30 tablet  5  . hydrOXYzine (ATARAX/VISTARIL) 25 MG tablet Take 25 mg by mouth daily.        Marland Kitchen losartan (COZAAR) 100 MG tablet Take 1 tablet (100 mg total) by mouth daily.  30 tablet  6  . metFORMIN  (GLUCOPHAGE) 500 MG tablet Take 1,000 mg by mouth 2 (two) times daily with a meal.       . metoprolol succinate (TOPROL-XL) 50 MG 24 hr tablet Take 1 tablet (50 mg total) by mouth daily.  30 tablet  6  . Multiple Vitamin (MULTIVITAMIN) capsule Take 1 capsule by mouth daily.        . nitroGLYCERIN (NITROSTAT) 0.4 MG SL tablet Place 1 tablet (0.4 mg total) under the tongue every 5 (five) minutes as needed.  25 tablet  11  . promethazine (PHENERGAN) 25 MG tablet       . ranitidine (ZANTAC) 75 MG tablet Take 75 mg by mouth 2 (two) times daily.      . [DISCONTINUED] rOPINIRole (REQUIP) 1 MG tablet Take 1 mg by mouth 3 (three) times daily.        No current facility-administered medications on file prior to visit.    Allergies:  Allergies  Allergen Reactions  . Hydrocodone   . Morphine       Review of Systems: See HPI for pertinent ROS. All other ROS negative.  Physical Exam: Blood pressure 118/70, pulse 84, temperature 97.5 F (36.4 C), temperature source Oral, resp. rate 18, height 5\' 7"  (1.702 m), weight 224 lb (101.606 kg)., Body mass index is 35.08 kg/(m^2). General: WNWD WM.  Appears in no acute distress. Lungs: Clear bilaterally to auscultation without wheezes, rales, or rhonchi. Breathing is unlabored. Heart: Regular rhythm. No murmurs, rubs, or gallops. Msk:  Strength and tone normal for age. Extremities/Skin:  Left Anterior Shin: 1 inch x 2mm scab. Surrounding pink erythema 1/2 cm diameter around all edges of scab. No drainage.  Right Fore Arm-dorsal surface : 3/4 cm diameter papule. 1/4 cm raised, firm papule. White hyperkeratosis on top. No tenderness with palpation. No erythema, no sign of abscess. No drainage.  Neuro: Alert and oriented X 3. Moves all extremities spontaneously. Gait is normal. CNII-XII grossly in tact. Psych:  Responds to questions appropriately with a normal affect.     ASSESSMENT AND PLAN:  67 y.o. year old male with  1. 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  Recommend excision/biopsy. Pt agrees.  Site cleansed with Betadine and Alcohol. Anaesthesized with Epi/Lidocaine. Shave biopsy performed. Specimen sent to path. Bleeding ceaased with Drysol. F/U with pt once pathology report available. - Pathology  2. Infected wound, initial encounter--Left  Shin - cephALEXin (KEFLEX) 500 MG capsule; Take 1 capsule (500 mg total) by mouth 4 (four) times daily.  Dispense: 28 capsule; Refill: 0 F/U if this worsens or does not resolve.  [address] [place], [place], [facility] 08/22/2012 10:47 AM

## 2012-08-25 LAB — PATHOLOGY

## 2012-08-28 ENCOUNTER — Encounter: Payer: Medicare Other | Admitting: Physician Assistant

## 2012-08-28 ENCOUNTER — Encounter: Payer: Self-pay | Admitting: Physician Assistant

## 2012-08-28 ENCOUNTER — Encounter: Payer: Self-pay | Admitting: Family Medicine

## 2012-08-28 NOTE — Progress Notes (Signed)
Patient ID: Jeremy Terry, male   DOB: May 31, 1945, 67 y.o.   MRN: [id number] Pt has dermatology appointment on September 11 2012 at 900am with [facility]. Pt is aware of appointment. Left message on machine location of doctor appt per pt request.

## 2012-08-31 NOTE — Progress Notes (Signed)
This encounter was created in error - please disregard.

## 2012-09-01 ENCOUNTER — Encounter (INDEPENDENT_AMBULATORY_CARE_PROVIDER_SITE_OTHER): Payer: Self-pay

## 2012-09-01 ENCOUNTER — Telehealth: Payer: Self-pay | Admitting: Family Medicine

## 2012-09-01 ENCOUNTER — Telehealth: Payer: Self-pay

## 2012-09-01 DIAGNOSIS — R0989 Other specified symptoms and signs involving the circulatory and respiratory systems: Secondary | ICD-10-CM

## 2012-09-01 MED ORDER — LOSARTAN POTASSIUM 100 MG PO TABS: 100.0000 mg | ORAL_TABLET | Freq: Every day | ORAL | Status: DC

## 2012-09-01 NOTE — Telephone Encounter (Signed)
Pt's wife calling for results of recent MRI.

## 2012-09-01 NOTE — Telephone Encounter (Signed)
Rx Refilled  

## 2012-09-01 NOTE — Telephone Encounter (Signed)
Pt's wife notified.

## 2012-09-01 NOTE — Telephone Encounter (Signed)
Showed that the brain has shrunk some (can happen with age, but this may be slightly more than one would expect for his age).  Will talk about details next visit.

## 2012-09-10 ENCOUNTER — Ambulatory Visit
Admission: RE | Admit: 2012-09-10 | Discharge: 2012-09-10 | Disposition: A | Discharge status: Home / Self Care | Payer: Medicare Other | Source: Ambulatory Visit | Attending: Physician Assistant | Admitting: Physician Assistant

## 2012-09-10 ENCOUNTER — Telehealth: Payer: Self-pay | Admitting: Family Medicine

## 2012-09-10 ENCOUNTER — Ambulatory Visit (INDEPENDENT_AMBULATORY_CARE_PROVIDER_SITE_OTHER): Payer: Medicare Other | Admitting: Physician Assistant

## 2012-09-10 VITALS — BP 112/70 | HR 88 | Temp 97.9°F | Resp 18 | Wt 226.0 lb

## 2012-09-10 DIAGNOSIS — M79609 Pain in unspecified limb: Secondary | ICD-10-CM

## 2012-09-10 DIAGNOSIS — M79641 Pain in right hand: Secondary | ICD-10-CM

## 2012-09-10 DIAGNOSIS — S6291XA Unspecified fracture of right wrist and hand, initial encounter for closed fracture: Secondary | ICD-10-CM

## 2012-09-10 NOTE — Telephone Encounter (Signed)
Message copied by Donne Anon on Wed Sep 10, 2012  4:49 PM ------      Message from: Allayne Butcher      Created: Wed Sep 10, 2012  4:46 PM       Tell pt that XRay does show small fracture. This will require casting. Call pt and see if he has preference of which ortho he sees. Please arrange f/u with ortho for this evening or for tomorrow. ------

## 2012-09-10 NOTE — Progress Notes (Signed)
Patient ID: CRISTEN MURCIA MRN: [id number], DOB: April 29, 1945, 67 y.o. Date of Encounter: 09/10/2012, 3:01 PM    Chief Complaint:  Chief Complaint  Patient presents with  . fell 2 days ago,  pain swelling to right hand     HPI: 67 y.o. year old white male here with his wife. Reports that he tripped on a bucket and fell 09/08/12. Fell onto outstretched right hand. Has seeked no eval of this until now.  Morning of 7/15 had some swelling in right hand. This morning right hand was more swollen so came her for eval. Has mild pain in hand, mostly near base of 4th-5ht fingers.     Home Meds: See attached medication section for any medications that were entered at today's visit. The computer does not put those onto this list.The following list is a list of meds entered prior to today's visit.  Current Outpatient Prescriptions on File Prior to Visit  Medication Sig Dispense Refill  . aspirin 81 MG tablet Take 81 mg by mouth daily.        Marland Kitchen atorvastatin (LIPITOR) 40 MG tablet TAKE ONE TABLET BY MOUTH DAILY  30 tablet  10  . carbidopa-levodopa (SINEMET CR) 25-100 MG per tablet Take 1 tablet by mouth. One and half 4 times a day.      . cephALEXin (KEFLEX) 500 MG capsule Take 1 capsule (500 mg total) by mouth 4 (four) times daily.  28 capsule  0  . clopidogrel (PLAVIX) 75 MG tablet Take 1 tablet (75 mg total) by mouth daily.  30 tablet  12  . diphenoxylate-atropine (LOMOTIL) 2.5-0.025 MG per tablet       . donepezil (ARICEPT) 5 MG tablet Take 1 tablet (5 mg total) by mouth at bedtime as needed.  30 tablet  3  . DULoxetine (CYMBALTA) 60 MG capsule Take 60 mg by mouth daily.        . fexofenadine (ALLEGRA) 180 MG tablet Take 180 mg by mouth daily.        Marland Kitchen glucose blood (FREESTYLE LITE) test strip Check blood sugar daily and prn - Dx:250.00  100 each  3  . hydrochlorothiazide (HYDRODIURIL) 25 MG tablet Take 1 tablet (25 mg total) by mouth daily.  30 tablet  5  . hydrOXYzine (ATARAX/VISTARIL) 25 MG  tablet Take 25 mg by mouth daily.        Marland Kitchen losartan (COZAAR) 100 MG tablet Take 1 tablet (100 mg total) by mouth daily.  30 tablet  6  . metFORMIN (GLUCOPHAGE) 500 MG tablet Take 1,000 mg by mouth 2 (two) times daily with a meal.       . metoprolol succinate (TOPROL-XL) 50 MG 24 hr tablet Take 1 tablet (50 mg total) by mouth daily.  30 tablet  6  . Multiple Vitamin (MULTIVITAMIN) capsule Take 1 capsule by mouth daily.        . nitroGLYCERIN (NITROSTAT) 0.4 MG SL tablet Place 1 tablet (0.4 mg total) under the tongue every 5 (five) minutes as needed.  25 tablet  11  . promethazine (PHENERGAN) 25 MG tablet       . ranitidine (ZANTAC) 75 MG tablet Take 75 mg by mouth 2 (two) times daily.      . [DISCONTINUED] rOPINIRole (REQUIP) 1 MG tablet Take 1 mg by mouth 3 (three) times daily.        No current facility-administered medications on file prior to visit.    Allergies:  Allergies  Allergen Reactions  .  Hydrocodone   . Morphine       Review of Systems: See HPI for pertinent ROS. All other ROS negative.    Physical Exam: Blood pressure 112/70, pulse 88, temperature 97.9 F (36.6 C), temperature source Oral, resp. rate 18, weight 226 lb (102.513 kg)., Body mass index is 35.39 kg/(m^2). General: WNWD WM. Appears in no acute distress. Lungs: Clear bilaterally to auscultation without wheezes, rales, or rhonchi. Breathing is unlabored. Heart: Regular rhythm. No murmurs, rubs, or gallops. Msk:  Strength and tone normal for age. Right hand: entire dorsum or right hand is mildly swollen. There is no ecchymosis. There is mild tenderness with palpation at dorsum of hand, just proximal to 4th-5th fingers.  There is no pain at anatomic snuff box and no tenderness with palpation of this area.  Neuro: Alert and oriented X 3. Moves all extremities spontaneously. Gait is normal. CNII-XII grossly in tact. Psych:  Responds to questions appropriately with a normal affect.     ASSESSMENT AND PLAN:  67  y.o. year old male with  1. Right hand pain - DG Hand Complete Right; Future Will obtain xray to r/o fracture then will f/u with pt once i get these results.   [address] [place], [place], [facility] 09/10/2012 3:01 PM

## 2012-09-11 NOTE — Telephone Encounter (Signed)
Spoke to wife. Understands there is fracture in right hand.  Appt today to see Ortho.  Pt is aware

## 2012-09-15 ENCOUNTER — Ambulatory Visit: Payer: Medicare Other | Admitting: Nurse Practitioner

## 2012-09-22 ENCOUNTER — Ambulatory Visit: Payer: Medicare Other | Admitting: Nurse Practitioner

## 2012-09-30 ENCOUNTER — Other Ambulatory Visit: Payer: Self-pay | Admitting: *Deleted

## 2012-09-30 MED ORDER — METOPROLOL SUCCINATE ER 50 MG PO TB24: 50.0000 mg | ORAL_TABLET | Freq: Every day | ORAL | Status: DC

## 2012-10-03 ENCOUNTER — Ambulatory Visit (INDEPENDENT_AMBULATORY_CARE_PROVIDER_SITE_OTHER): Payer: Medicare Other | Admitting: Neurology

## 2012-10-03 ENCOUNTER — Encounter: Payer: Self-pay | Admitting: Neurology

## 2012-10-03 VITALS — BP 138/84 | HR 72 | Temp 98.0°F | Resp 18 | Wt 218.0 lb

## 2012-10-03 DIAGNOSIS — F0280 Dementia in other diseases classified elsewhere without behavioral disturbance: Secondary | ICD-10-CM

## 2012-10-03 DIAGNOSIS — H532 Diplopia: Secondary | ICD-10-CM

## 2012-10-03 MED ORDER — DULOXETINE HCL 30 MG PO CPEP: 30.0000 mg | ORAL_CAPSULE | Freq: Every day | ORAL | Status: DC

## 2012-10-03 MED ORDER — DONEPEZIL HCL 10 MG PO TABS: 10.0000 mg | ORAL_TABLET | Freq: Every day | ORAL | Status: DC

## 2012-10-03 NOTE — Progress Notes (Signed)
Jeremy Terry was seen today in the movement disorders clinic for neurologic consultation at the request of [facility] [name], MD.  He was previously seen by Dr. Marjory Terry.  They're requesting a second opinion regarding possible DBS surgery.  He has an appointment with [facility] in this regard as well.  The pt began with L hand tremor that began in 07/2009.  He was started on requip in 06/2010, but after 2 years on the medication, it was tapered off just 2 days ago because of compulsive spending.  When he was initally placed on the medication he had some nausea but his wife did think that it was effective. He has been on levodopa since 09/2011.  This was definitely effective.  He takes 1 1/2 tablets three to four times per day.  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  His wife notices that the medication may wear off about 30 minutes to one hour before the next dosage, as she will notice a resurgence of tremor.  He takes medication at 5 AM, 12 PM, often misses the 3 PM dose and then was taken another at 6 PM.  10/03/12:   The patient presents today with his wife who supplements the history.  They report that he did not get started on Comtan.  His pharmacy told them that it was backordered.  However, when we called the pharmacy and they stated that they had not received the prescription.  The patient did have a fall on July 16 resulting in a wrist fracture.  He was outside and fell over a bucket when he tripped. 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  He is currently taking his carbidopa/levodopa 25/100, 1-1/2 tablets at 8 AM/12 PM/3 M/6 PM.  He cannot tell when the medication wears off.  His wife states that it definitely does help.  The patient did go to therapy.  It was recommended that he use a cane.  He has not been doing that.  His wife recalls that he has been very impulsive.  He  is having major personality change.  He curses a lot and he never did this previously.  He has no hallucinations.  He is having intermittent diplopia.  I did review his MRI of the brain that was recently done.  There was ventricular prominence, but I thought that the frontotemporal atrophy was greater than the atrophy posteriorly.  PREVIOUS MEDICATIONS: Requip (compulsive spending)  ALLERGIES:   Allergies  Allergen Reactions  . Hydrocodone   . Morphine     CURRENT MEDICATIONS:  Current Outpatient Prescriptions on File Prior to Visit  Medication Sig Dispense Refill  . aspirin 81 MG tablet Take 81 mg by mouth daily.        Marland Kitchen atorvastatin (LIPITOR) 40 MG tablet TAKE ONE TABLET BY MOUTH DAILY  30 tablet  10  . carbidopa-levodopa (SINEMET CR) 25-100 MG per tablet Take 1 tablet by mouth. One and half 4 times a day.      . clopidogrel (PLAVIX) 75 MG tablet Take 1 tablet (75 mg total) by mouth daily.  30 tablet  12  . donepezil (ARICEPT) 5 MG tablet Take 1 tablet (5 mg total) by mouth at bedtime as needed.  30 tablet  3  . DULoxetine (CYMBALTA) 60 MG capsule Take 60 mg by mouth daily.        Marland Kitchen  fexofenadine (ALLEGRA) 180 MG tablet Take 180 mg by mouth daily.        Marland Kitchen glucose blood (FREESTYLE LITE) test strip Check blood sugar daily and prn - Dx:250.00  100 each  3  . hydrochlorothiazide (HYDRODIURIL) 25 MG tablet Take 1 tablet (25 mg total) by mouth daily.  30 tablet  5  . hydrOXYzine (ATARAX/VISTARIL) 25 MG tablet Take 25 mg by mouth daily.        Marland Kitchen losartan (COZAAR) 100 MG tablet Take 1 tablet (100 mg total) by mouth daily.  30 tablet  6  . metFORMIN (GLUCOPHAGE) 500 MG tablet Take 1,000 mg by mouth 2 (two) times daily with a meal.       . metoprolol succinate (TOPROL-XL) 50 MG 24 hr tablet Take 1 tablet (50 mg total) by mouth daily.  30 tablet  6  . Multiple Vitamin (MULTIVITAMIN) capsule Take 1 capsule by mouth daily.        . nitroGLYCERIN (NITROSTAT) 0.4 MG SL tablet Place 1 tablet (0.4 mg  total) under the tongue every 5 (five) minutes as needed.  25 tablet  11  . ranitidine (ZANTAC) 75 MG tablet Take 75 mg by mouth 2 (two) times daily.      . [DISCONTINUED] rOPINIRole (REQUIP) 1 MG tablet Take 1 mg by mouth 3 (three) times daily.        No current facility-administered medications on file prior to visit.    PAST MEDICAL HISTORY:   Past Medical History  Diagnosis Date  . Coronary artery disease   . Hypertension   . Diabetes mellitus   . Hypercholesteremia   . BPH (benign prostatic hypertrophy)   . DJD (degenerative joint disease)   . Asthma   . Memory loss   . Ventral hernia   . GERD (gastroesophageal reflux disease)   . Allergic rhinitis   . Parkinsonian syndrome   . Hypogonadism male   . Neuropathy of leg     Bilateral  . Depression   . Parkinson disease     PAST SURGICAL HISTORY:   Past Surgical History  Procedure Laterality Date  . Coronary stent placement  2003  . Shoulder surgery  2011    rotator cuff    SOCIAL HISTORY:   History   Social History  . Marital Status: Married    Spouse Name: N/A    Number of Children: 2  . Years of Education: 14   Occupational History  . retired     Visual merchandiser   Social History Main Topics  . Smoking status: Former Smoker    Quit date: 02/26/1998  . Smokeless tobacco: Never Used  . Alcohol Use: No  . Drug Use: No  . Sexually Active: Not on file   Other Topics Concern  . Not on file   Social History Narrative   Pt lives at home with his spouse.   Caffeine Use: Does not consume    FAMILY HISTORY:   Family Status  Relation Status Death Age  . Father Deceased 41    CHF  . Mother Deceased     pneumonia  . Child Alive     2, healthy    ROS:  A complete 10 system review of systems was obtained and was unremarkable apart from what is mentioned above.  PHYSICAL EXAMINATION:    VITALS:   Filed Vitals:   10/03/12 1436  BP: 138/84  Pulse: 72  Temp: 98 F (36.7 C)  Resp: 18  Weight: 218  lb  (98.884 kg)    GEN:  The patient appears stated age and is in NAD. HEENT:  Normocephalic, atraumatic.  The mucous membranes are moist. The superficial temporal arteries are without ropiness or tenderness. CV:  RRR Lungs:  CTAB Neck/HEME:  There are no carotid bruits bilaterally.  Neurological examination:  Orientation: The patient is alert and oriented x3.  However, he is more confused about his medical history today.  He cannot remember he is a diabetic.  He could not remember how he tripped. Cranial nerves: There is good facial symmetry.  There is facial hypomimia.  Pupils are equal round and reactive to light bilaterally. Fundoscopic exam reveals clear margins bilaterally. Extraocular muscles are intact.  There are definite square wave jerks.  The visual fields are full to confrontational testing. The speech is fluent and clear.  At times, it becomes quite hypophonic and he has significant difficulty with the guttural sounds.  Soft palate rises symmetrically and there is no tongue deviation. Hearing is intact to conversational tone. Sensation: Sensation is intact to light and pinprick throughout (facial, trunk, extremities). Vibration is decreased in a distal fashion. There is no extinction with double simultaneous stimulation. There is no sensory dermatomal level identified. Motor: Strength is 5/5 in the bilateral upper and lower extremities.   Shoulder shrug is equal and symmetric.  There is no pronator drift. Deep tendon reflexes: Deep tendon reflexes are 3/4 at the bilateral biceps, triceps, brachioradialis, patella and achilles. Plantar response is upgoing on the left and downgoing on the right.  Movement examination: Tone: There is mildly increased tone in the bilateral upper extremities.  The tone in the lower extremities is normal.  Abnormal movements: There is a near constant moderate left upper extremity resting tremor and a very rare moderate right upper extremity resting tremor.   There is no tremor in the legs her head. Coordination:  There is some decremation with hand opening and closing on the left, but finger taps were good bilaterally.  He has difficulty with heel taps and toe taps bilaterally. Gait and Station: The patient has no difficulty arising out of a deep-seated chair without the use of the hands. The patient's stride length is normal, but the left upper extremity resting tremor does increase with ambulation.  The patient has a negative pull test.      Labs: I got lab work from his primary care physician dated September 08, 2012; his sodium was 138, potassium 4.4, chloride 96, CO2 26, BUN 18 creatinine 1.00.  Hemoglobin A1c was 6.3.  White blood cells were 9.4, hemoglobin 16.3, hematocrit 46.7 and platelets 300.  ASSESSMENT/PLAN:  1.  Parkinsonism.  -He does have some atypical features that are worrisome for one of the atypical parkinsonian states.  He is hyperreflexic, has an upgoing toe on the right, has square wave jerks, has diplopia and has had personality changes.  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  I am going to increase his Aricept to 10 mg daily.  -I agree with the reduction/now-discontinuation of Requip due to compulsive spending.  -For today, I am not going to change his carbidopa/levodopa 25/100, 1-1/2 tablets 4 times per day.  He is going to hold on adding Comtan right now because I am changing other things.  -He asked me multiple questions about DBS  and whether or not he is a candidate.  I expressed to him that I do not think he is a candidate, as I do not think he has idiopathic Parkinson's disease. 2.  Peripheral neuropathy, likely secondary to diabetes.  -I am going to try to decrease the Cymbalta, and ultimately discontinue it.  He no longer has any paresthesias.  I am  worried about potential side effects of this medication.  If he needs it, however, he can go back on it. 3.  I will followup with patient in 4 weeks, sooner should new neurologic issues arise.

## 2012-10-03 NOTE — Patient Instructions (Signed)
1.  Decrease cymbalta to 30 mg daily 2.  Increase aricept to 10 mg daily (may take 2 of your 5mg  tablets to use them up) 3.  Stay on carbidopa/levodopa as previous 4.  Use your cane 5.  Exercise on your treadmill 4-5 days per week, while holding onto the rails

## 2012-10-06 ENCOUNTER — Other Ambulatory Visit: Payer: Self-pay | Admitting: *Deleted

## 2012-10-06 MED ORDER — HYDROCHLOROTHIAZIDE 25 MG PO TABS: 25.0000 mg | ORAL_TABLET | Freq: Every day | ORAL | Status: DC

## 2012-10-16 ENCOUNTER — Ambulatory Visit (INDEPENDENT_AMBULATORY_CARE_PROVIDER_SITE_OTHER): Payer: Medicare Other | Admitting: Family Medicine

## 2012-10-16 ENCOUNTER — Encounter: Payer: Self-pay | Admitting: Family Medicine

## 2012-10-16 VITALS — BP 120/68 | HR 78 | Temp 98.2°F | Resp 18 | Wt 231.0 lb

## 2012-10-16 DIAGNOSIS — N419 Inflammatory disease of prostate, unspecified: Secondary | ICD-10-CM

## 2012-10-16 DIAGNOSIS — R350 Frequency of micturition: Secondary | ICD-10-CM

## 2012-10-16 LAB — URINALYSIS, ROUTINE W REFLEX MICROSCOPIC
Bilirubin Urine: NEGATIVE
Ketones, ur: NEGATIVE mg/dL
Nitrite: NEGATIVE
Specific Gravity, Urine: 1.03 (ref 1.005–1.030)
pH: 5.5 (ref 5.0–8.0)

## 2012-10-16 LAB — URINALYSIS, MICROSCOPIC ONLY

## 2012-10-16 MED ORDER — CIPROFLOXACIN HCL 500 MG PO TABS: 500.0000 mg | ORAL_TABLET | Freq: Two times a day (BID) | ORAL | Status: DC

## 2012-10-16 MED ORDER — TAMSULOSIN HCL 0.4 MG PO CAPS: 0.4000 mg | ORAL_CAPSULE | Freq: Every day | ORAL | Status: DC

## 2012-10-16 NOTE — Progress Notes (Signed)
Subjective:    Patient ID: Jeremy Terry, male    DOB: 1945-05-22, 67 y.o.   MRN: [id number]  HPI Patient reports 3-4 weeks of increasing polyuria. He also reports to 5 episodes of nocturia per night. He reports a weak urinary stream. He reports hesitancy and urgency. He denies dysuria or hematuria. He denies any fevers chills or low back pain. He denies nausea or vomiting. Past Medical History  Diagnosis Date  . Coronary artery disease   . Hypertension   . Diabetes mellitus   . Hypercholesteremia   . BPH (benign prostatic hypertrophy)   . DJD (degenerative joint disease)   . Asthma   . Memory loss   . Ventral hernia   . GERD (gastroesophageal reflux disease)   . Allergic rhinitis   . Parkinsonian syndrome   . Hypogonadism male   . Neuropathy of leg     Bilateral  . Depression   . Parkinson disease    Current Outpatient Prescriptions on File Prior to Visit  Medication Sig Dispense Refill  . aspirin 81 MG tablet Take 81 mg by mouth daily.        Marland Kitchen atorvastatin (LIPITOR) 40 MG tablet TAKE ONE TABLET BY MOUTH DAILY  30 tablet  10  . carbidopa-levodopa (SINEMET CR) 25-100 MG per tablet Take 1 tablet by mouth. One and half 4 times a day.      . clopidogrel (PLAVIX) 75 MG tablet Take 1 tablet (75 mg total) by mouth daily.  30 tablet  12  . DULoxetine (CYMBALTA) 30 MG capsule Take 1 capsule (30 mg total) by mouth daily.  30 capsule  3  . glucose blood (FREESTYLE LITE) test strip Check blood sugar daily and prn - Dx:250.00  100 each  3  . hydrochlorothiazide (HYDRODIURIL) 25 MG tablet Take 1 tablet (25 mg total) by mouth daily.  30 tablet  5  . hydrOXYzine (ATARAX/VISTARIL) 25 MG tablet Take 25 mg by mouth daily.        Marland Kitchen losartan (COZAAR) 100 MG tablet Take 1 tablet (100 mg total) by mouth daily.  30 tablet  6  . metFORMIN (GLUCOPHAGE) 500 MG tablet Take 1,000 mg by mouth 2 (two) times daily with a meal.       . metoprolol succinate (TOPROL-XL) 50 MG 24 hr tablet Take 1 tablet (50 mg  total) by mouth daily.  30 tablet  6  . Multiple Vitamin (MULTIVITAMIN) capsule Take 1 capsule by mouth daily.        . nitroGLYCERIN (NITROSTAT) 0.4 MG SL tablet Place 1 tablet (0.4 mg total) under the tongue every 5 (five) minutes as needed.  25 tablet  11  . ranitidine (ZANTAC) 75 MG tablet Take 75 mg by mouth 2 (two) times daily.      . [DISCONTINUED] rOPINIRole (REQUIP) 1 MG tablet Take 1 mg by mouth 3 (three) times daily.        No current facility-administered medications on file prior to visit.   Allergies  Allergen Reactions  . Hydrocodone   . Morphine    History   Social History  . Marital Status: Married    Spouse Name: N/A    Number of Children: 2  . Years of Education: 14   Occupational History  . retired     Visual merchandiser   Social History Main Topics  . Smoking status: Former Smoker    Quit date: 02/26/1998  . Smokeless tobacco: Never Used  . Alcohol Use: No  .  Drug Use: No  . Sexual Activity: Not on file   Other Topics Concern  . Not on file   Social History Narrative   Pt lives at home with his spouse.   Caffeine Use: Does not consume   Family History  Problem Relation Age of Onset  . Other Father     respiratory problems      Review of Systems  All other systems reviewed and are negative.       Objective:   Physical Exam  Constitutional: He appears well-developed and well-nourished.  Cardiovascular: Normal rate and regular rhythm.   Pulmonary/Chest: Effort normal and breath sounds normal.  Abdominal: Soft. Bowel sounds are normal.   patient has a swollen +2 prostate without nodularity.  It is nontender.        Assessment & Plan:  1. Frequent urination - Urinalysis, Routine w reflex microscopic - Urine culture  2. Prostatitis I believe the patient either has prostatitis or BPH. Treat as prostatitis with Cipro 500 mg by mouth twice a day for 14 days and Flomax 0.4 mg by mouth daily. Recheck in 2 weeks. I will also need to recheck a  urinalysis at that time to make sure the hematuria resolves. - ciprofloxacin (CIPRO) 500 MG tablet; Take 1 tablet (500 mg total) by mouth 2 (two) times daily.  Dispense: 28 tablet; Refill: 0 - tamsulosin (FLOMAX) 0.4 MG CAPS capsule; Take 1 capsule (0.4 mg total) by mouth daily.  Dispense: 30 capsule; Refill: 3

## 2012-10-17 LAB — URINE CULTURE: Organism ID, Bacteria: NO GROWTH

## 2012-10-30 ENCOUNTER — Encounter: Payer: Self-pay | Admitting: Family Medicine

## 2012-10-30 ENCOUNTER — Ambulatory Visit (INDEPENDENT_AMBULATORY_CARE_PROVIDER_SITE_OTHER): Payer: Medicare Other | Admitting: Family Medicine

## 2012-10-30 VITALS — BP 126/74 | HR 80 | Temp 98.1°F | Resp 18 | Wt 228.0 lb

## 2012-10-30 DIAGNOSIS — L0292 Furuncle, unspecified: Secondary | ICD-10-CM

## 2012-10-30 DIAGNOSIS — N419 Inflammatory disease of prostate, unspecified: Secondary | ICD-10-CM

## 2012-10-30 MED ORDER — SULFAMETHOXAZOLE-TMP DS 800-160 MG PO TABS: 1.0000 | ORAL_TABLET | Freq: Two times a day (BID) | ORAL | Status: DC

## 2012-10-30 NOTE — Progress Notes (Signed)
Subjective:    Patient ID: Jeremy Terry, male    DOB: 06/10/1945, 67 y.o.   MRN: [id number]  HPI 10/16/12 Patient reports 3-4 weeks of increasing polyuria. He also reports to 5 episodes of nocturia per night. He reports a weak urinary stream. He reports hesitancy and urgency. He denies dysuria or hematuria. He denies any fevers chills or low back pain. He denies nausea or vomiting.  At that time, my plan was: 1. Frequent urination - Urinalysis, Routine w reflex microscopic - Urine culture  2. Prostatitis I believe the patient either has prostatitis or BPH. Treat as prostatitis with Cipro 500 mg by mouth twice a day for 14 days and Flomax 0.4 mg by mouth daily. Recheck in 2 weeks. I will also need to recheck a urinalysis at that time to make sure the hematuria resolves. - ciprofloxacin (CIPRO) 500 MG tablet; Take 1 tablet (500 mg total) by mouth 2 (two) times daily.  Dispense: 28 tablet; Refill: 0 - tamsulosin (FLOMAX) 0.4 MG CAPS capsule; Take 1 capsule (0.4 mg total) by mouth daily.  Dispense: 30 capsule; Refill: 3  Today the patient is doing much better. He is having 1 episode of nocturia per night. His pyuria has improved greatly. His urgency hesitancy and frequency is much better. He denies any dysuria. He is completing the Cipro. He is still taking flomax.  However he is concerned by a red tender fluctuant nodule on his left upper chest. It has the characteristics of a barrel. It is 2 cm in size, painful and oozing purulent material..  Past Medical History  Diagnosis Date  . Coronary artery disease   . Hypertension   . Diabetes mellitus   . Hypercholesteremia   . BPH (benign prostatic hypertrophy)   . DJD (degenerative joint disease)   . Asthma   . Memory loss   . Ventral hernia   . GERD (gastroesophageal reflux disease)   . Allergic rhinitis   . Parkinsonian syndrome   . Hypogonadism male   . Neuropathy of leg     Bilateral  . Depression   . Parkinson disease    Current  Outpatient Prescriptions on File Prior to Visit  Medication Sig Dispense Refill  . aspirin 81 MG tablet Take 81 mg by mouth daily.        Marland Kitchen atorvastatin (LIPITOR) 40 MG tablet TAKE ONE TABLET BY MOUTH DAILY  30 tablet  10  . carbidopa-levodopa (SINEMET CR) 25-100 MG per tablet Take 1 tablet by mouth. One and half 4 times a day.      . clopidogrel (PLAVIX) 75 MG tablet Take 1 tablet (75 mg total) by mouth daily.  30 tablet  12  . donepezil (ARICEPT) 10 MG tablet Take 20 mg by mouth at bedtime.      . DULoxetine (CYMBALTA) 30 MG capsule Take 1 capsule (30 mg total) by mouth daily.  30 capsule  3  . fexofenadine (ALLEGRA) 60 MG tablet Take 60 mg by mouth daily.      Marland Kitchen glucose blood (FREESTYLE LITE) test strip Check blood sugar daily and prn - Dx:250.00  100 each  3  . hydrochlorothiazide (HYDRODIURIL) 25 MG tablet Take 1 tablet (25 mg total) by mouth daily.  30 tablet  5  . hydrOXYzine (ATARAX/VISTARIL) 25 MG tablet Take 25 mg by mouth daily.        Marland Kitchen losartan (COZAAR) 100 MG tablet Take 1 tablet (100 mg total) by mouth daily.  30 tablet  6  .  metFORMIN (GLUCOPHAGE) 500 MG tablet Take 1,000 mg by mouth 2 (two) times daily with a meal.       . metoprolol succinate (TOPROL-XL) 50 MG 24 hr tablet Take 1 tablet (50 mg total) by mouth daily.  30 tablet  6  . Multiple Vitamin (MULTIVITAMIN) capsule Take 1 capsule by mouth daily.        . nitroGLYCERIN (NITROSTAT) 0.4 MG SL tablet Place 1 tablet (0.4 mg total) under the tongue every 5 (five) minutes as needed.  25 tablet  11  . ranitidine (ZANTAC) 75 MG tablet Take 75 mg by mouth 2 (two) times daily.      . tamsulosin (FLOMAX) 0.4 MG CAPS capsule Take 1 capsule (0.4 mg total) by mouth daily.  30 capsule  3  . [DISCONTINUED] rOPINIRole (REQUIP) 1 MG tablet Take 1 mg by mouth 3 (three) times daily.        No current facility-administered medications on file prior to visit.   Allergies  Allergen Reactions  . Hydrocodone   . Morphine    History    Social History  . Marital Status: Married    Spouse Name: N/A    Number of Children: 2  . Years of Education: 14   Occupational History  . retired     Visual merchandiser   Social History Main Topics  . Smoking status: Former Smoker    Quit date: 02/26/1998  . Smokeless tobacco: Never Used  . Alcohol Use: No  . Drug Use: No  . Sexual Activity: Not on file   Other Topics Concern  . Not on file   Social History Narrative   Pt lives at home with his spouse.   Caffeine Use: Does not consume   Family History  Problem Relation Age of Onset  . Other Father     respiratory problems      Review of Systems  All other systems reviewed and are negative.       Objective:   Physical Exam  Constitutional: He appears well-developed and well-nourished.  Cardiovascular: Normal rate and regular rhythm.   Pulmonary/Chest: Effort normal and breath sounds normal.  Abdominal: Soft. Bowel sounds are normal.   patient has a swollen +2 prostate without nodularity.  It is nontender. 2 cm abscess on the left upper chest.        Assessment & Plan:  1. Prostatitis Prostatitis is resolved. No further antibiotics. Continue Flomax 0.4 mg by mouth daily for BPH. Recheck in 2 weeks if symptoms worsen we may need to extend antibiotics longer  2. Boil The abscess was anesthetized with 0.1% lidocaine. A 1 cm incision was made and purulent material was expressed from the boil. The wound was then probed with hydrogen peroxide and Q-tips. It was then packed with 5 cm of iodoform gauze. Wound care was discussed. A wound culture was sent. Begin Bactrim double strength tablets 1 by mouth twice a day for 7 days. - Culture, routine-abscess

## 2012-10-31 ENCOUNTER — Ambulatory Visit (INDEPENDENT_AMBULATORY_CARE_PROVIDER_SITE_OTHER): Payer: Medicare Other | Admitting: Neurology

## 2012-10-31 ENCOUNTER — Telehealth: Payer: Self-pay | Admitting: Family Medicine

## 2012-10-31 VITALS — BP 126/68 | HR 80 | Temp 98.7°F | Resp 20 | Wt 229.4 lb

## 2012-10-31 DIAGNOSIS — E1140 Type 2 diabetes mellitus with diabetic neuropathy, unspecified: Secondary | ICD-10-CM

## 2012-10-31 DIAGNOSIS — E1149 Type 2 diabetes mellitus with other diabetic neurological complication: Secondary | ICD-10-CM

## 2012-10-31 DIAGNOSIS — E1142 Type 2 diabetes mellitus with diabetic polyneuropathy: Secondary | ICD-10-CM

## 2012-10-31 DIAGNOSIS — F0280 Dementia in other diseases classified elsewhere without behavioral disturbance: Secondary | ICD-10-CM

## 2012-10-31 MED ORDER — DONEPEZIL HCL 10 MG PO TABS: 10.0000 mg | ORAL_TABLET | Freq: Every day | ORAL | Status: DC

## 2012-10-31 MED ORDER — TRIHEXYPHENIDYL HCL 2 MG PO TABS: 2.0000 mg | ORAL_TABLET | Freq: Two times a day (BID) | ORAL | Status: DC

## 2012-10-31 NOTE — Telephone Encounter (Signed)
Pt called and provider recommendations relayed to him.  No more antibiotic and call back if area does not continue to improve

## 2012-10-31 NOTE — Telephone Encounter (Signed)
The new antibiotic is bactrim.  Stop bactrim.  The I+D is probably sufficient on its own to treat the boil.  Call me if the area worsens (gets red, more painful, spreads) and we could try doxycycline.

## 2012-10-31 NOTE — Progress Notes (Signed)
Jeremy Terry was seen today in the movement disorders clinic for neurologic consultation at the request of [facility] [name], MD.  He was previously seen by Dr. Marjory Lies.  They're requesting a second opinion regarding possible DBS surgery.  He has an appointment with [facility] in this regard as well.  The pt began with L hand tremor that began in 07/2009.  He was started on requip in 06/2010, but after 2 years on the medication, it was tapered off just 2 days ago because of compulsive spending.  When he was initally placed on the medication he had some nausea but his wife did think that it was effective. He has been on levodopa since 09/2011.  This was definitely effective.  He takes 1 1/2 tablets three to four times per day.  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  His wife notices that the medication may wear off about 30 minutes to one hour before the next dosage, as she will notice a resurgence of tremor.  He takes medication at 5 AM, 12 PM, often misses the 3 PM dose and then was taken another at 6 PM.  10/03/12:   The patient presents today with his wife who supplements the history.  They report that he did not get started on Comtan.  His pharmacy told them that it was backordered.  However, when we called the pharmacy and they stated that they had not received the prescription.  The patient did have a fall on July 16 resulting in a wrist fracture.  He was outside and fell over a bucket when he tripped. 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  He is currently taking his carbidopa/levodopa 25/100, 1-1/2 tablets at 8 AM/12 PM/3 M/6 PM.  He cannot tell when the medication wears off.  His wife states that it definitely does help.  The patient did go to therapy.  It was recommended that he use a cane.  He has not been doing that.  His wife recalls that he has been very impulsive.  He  is having major personality change.  He curses a lot and he never did this previously.  He has no hallucinations.  He is having intermittent diplopia.  10/31/12 update:  The pt presents with his wife who supplements the hx.  His Aricept was increased last time to 10 mg daily.  His Requip was d/c.  Is not sure of the levodopa is helping but he continues to take carbidopa/levodopa 25/100, 1-1/2 tablets 4 times per day.  I did discontinue his Cymbalta last visit as he was using it for peripheral neuropathy, but had no symptoms of paresthesias.  He noticed no increase in paresthesias with the discontinuation of Cymbalta.  His mood remains good.  His wife states that his personality has been much better and he is not cursing like he was.  He continues to have some intermittent double vision.  He is exercising some, although not necessarily faithfully.  I did review his MRI of the brain that was recently done.  There was ventricular prominence, but I thought that the frontotemporal atrophy was greater than the atrophy posteriorly.  PREVIOUS MEDICATIONS: Requip (compulsive spending), Cymbalta  ALLERGIES:   Allergies  Allergen Reactions  . Hydrocodone   . Morphine   . Sulfa Antibiotics     CURRENT MEDICATIONS:  Current Outpatient Prescriptions  on File Prior to Visit  Medication Sig Dispense Refill  . aspirin 81 MG tablet Take 81 mg by mouth daily.        Marland Kitchen atorvastatin (LIPITOR) 40 MG tablet TAKE ONE TABLET BY MOUTH DAILY  30 tablet  10  . carbidopa-levodopa (SINEMET CR) 25-100 MG per tablet Take 1 tablet by mouth. One and half 4 times a day.      . clopidogrel (PLAVIX) 75 MG tablet Take 1 tablet (75 mg total) by mouth daily.  30 tablet  12  . DULoxetine (CYMBALTA) 30 MG capsule Take 1 capsule (30 mg total) by mouth daily.  30 capsule  3  . fexofenadine (ALLEGRA) 60 MG tablet Take 60 mg by mouth daily.      Marland Kitchen glucose blood (FREESTYLE LITE) test strip Check blood sugar daily and prn - Dx:250.00  100  each  3  . hydrochlorothiazide (HYDRODIURIL) 25 MG tablet Take 1 tablet (25 mg total) by mouth daily.  30 tablet  5  . hydrOXYzine (ATARAX/VISTARIL) 25 MG tablet Take 25 mg by mouth daily.        Marland Kitchen losartan (COZAAR) 100 MG tablet Take 1 tablet (100 mg total) by mouth daily.  30 tablet  6  . metFORMIN (GLUCOPHAGE) 500 MG tablet Take 1,000 mg by mouth 2 (two) times daily with a meal.       . metoprolol succinate (TOPROL-XL) 50 MG 24 hr tablet Take 1 tablet (50 mg total) by mouth daily.  30 tablet  6  . Multiple Vitamin (MULTIVITAMIN) capsule Take 1 capsule by mouth daily.        . nitroGLYCERIN (NITROSTAT) 0.4 MG SL tablet Place 1 tablet (0.4 mg total) under the tongue every 5 (five) minutes as needed.  25 tablet  11  . ranitidine (ZANTAC) 75 MG tablet Take 75 mg by mouth 2 (two) times daily.      . tamsulosin (FLOMAX) 0.4 MG CAPS capsule Take 1 capsule (0.4 mg total) by mouth daily.  30 capsule  3  . sulfamethoxazole-trimethoprim (BACTRIM DS) 800-160 MG per tablet Take 1 tablet by mouth 2 (two) times daily.  14 tablet  0  . [DISCONTINUED] rOPINIRole (REQUIP) 1 MG tablet Take 1 mg by mouth 3 (three) times daily.        No current facility-administered medications on file prior to visit.    PAST MEDICAL HISTORY:   Past Medical History  Diagnosis Date  . Coronary artery disease   . Hypertension   . Diabetes mellitus   . Hypercholesteremia   . BPH (benign prostatic hypertrophy)   . DJD (degenerative joint disease)   . Asthma   . Memory loss   . Ventral hernia   . GERD (gastroesophageal reflux disease)   . Allergic rhinitis   . Parkinsonian syndrome   . Hypogonadism male   . Neuropathy of leg     Bilateral  . Depression   . Parkinson disease     PAST SURGICAL HISTORY:   Past Surgical History  Procedure Laterality Date  . Coronary stent placement  2003  . Shoulder surgery  2011    rotator cuff    SOCIAL HISTORY:   History   Social History  . Marital Status: Married     Spouse Name: N/A    Number of Children: 2  . Years of Education: 14   Occupational History  . retired     Visual merchandiser   Social History Main Topics  . Smoking status: Former  Smoker    Quit date: 02/26/1998  . Smokeless tobacco: Never Used  . Alcohol Use: No  . Drug Use: No  . Sexual Activity: Not on file   Other Topics Concern  . Not on file   Social History Narrative   Pt lives at home with his spouse.   Caffeine Use: Does not consume    FAMILY HISTORY:   Family Status  Relation Status Death Age  . Father Deceased 37    CHF  . Mother Deceased     pneumonia  . Child Alive     2, healthy    ROS:  A complete 10 system review of systems was obtained and was unremarkable apart from what is mentioned above.  PHYSICAL EXAMINATION:    VITALS:   Filed Vitals:   10/31/12 1426  BP: 126/68  Pulse: 80  Temp: 98.7 F (37.1 C)  TempSrc: Oral  Resp: 20  Weight: 229 lb 6.4 oz (104.055 kg)    GEN:  The patient appears stated age and is in NAD. HEENT:  Normocephalic, atraumatic.  The mucous membranes are moist. The superficial temporal arteries are without ropiness or tenderness. CV:  RRR Lungs:  CTAB Neck/HEME:  There are no carotid bruits bilaterally.  Neurological examination:  Orientation: The patient is alert and oriented x3.  However, he is more confused about his medical history today.  He cannot remember he is a diabetic.  He could not remember how he tripped. Cranial nerves: There is good facial symmetry.  There is facial hypomimia.  Pupils are equal round and reactive to light bilaterally. Fundoscopic exam reveals clear margins bilaterally. Extraocular muscles are intact.  There are definite square wave jerks.  The visual fields are full to confrontational testing. The speech is fluent and clear.  At times, it becomes quite hypophonic and he has significant difficulty with the guttural sounds.  Soft palate rises symmetrically and there is no tongue deviation. Hearing is  intact to conversational tone. Sensation: Sensation is intact to light and pinprick throughout (facial, trunk, extremities). Vibration is decreased in a distal fashion. There is no extinction with double simultaneous stimulation. There is no sensory dermatomal level identified. Motor: Strength is 5/5 in the bilateral upper and lower extremities.   Shoulder shrug is equal and symmetric.  There is no pronator drift. Deep tendon reflexes: Deep tendon reflexes are 3/4 at the bilateral biceps, triceps, brachioradialis, patella and achilles. Plantar response is upgoing on the left and downgoing on the right.  Movement examination: Tone: There is normal tone in the bilateral upper extremities.  The tone in the lower extremities is normal.  Abnormal movements: There is a near constant moderate left upper extremity resting tremor. There is no tremor in the legs her head. Coordination:  There is some decremation with hand opening and closing on the left, but finger taps were good bilaterally.  He has difficulty with heel taps and toe taps bilaterally. Gait and Station: The patient has no difficulty arising out of a deep-seated chair without the use of the hands. The patient's stride length is normal, but the left upper extremity resting tremor does increase with ambulation.  The patient has a negative pull test.      Labs: I got lab work from his primary care physician dated September 08, 2012; his sodium was 138, potassium 4.4, chloride 96, CO2 26, BUN 18 creatinine 1.00.  Hemoglobin A1c was 6.3.  White blood cells were 9.4, hemoglobin 16.3, hematocrit 46.7 and platelets  300.  ASSESSMENT/PLAN:  1.  Parkinsonism.  -He does have some atypical features that are worrisome for one of the atypical parkinsonian states.  He is hyperreflexic, has an upgoing toe on the right, has square wave jerks, has diplopia and has had personality changes.  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    -He will continue on the aricept 10 mg daily  -I am going to very cautiously add artane.  I talked with them extensively about its potential side effects.  They will let me know if he has any.  If this does not work, we could try amantadine.  -For today, I am not going to change his carbidopa/levodopa 25/100, 1-1/2 tablets 4 times per day.  He is going to hold on adding Comtan right now because I am changing other things.  -He asked me multiple questions in the past about DBS and whether or not he is a candidate.  I expressed to him that I do not think he is a candidate, as I do not think he has idiopathic Parkinson's disease. 2.  Peripheral neuropathy, likely secondary to diabetes.  -He is off of Cymbalta and doing well. 3.  I will followup with patient in 4 weeks, sooner should new neurologic issues arise.

## 2012-10-31 NOTE — Telephone Encounter (Signed)
Believes he is having allergic reaction to Cipro.  Has taken two doses and after each has felt extremely anxious.  Lasting about 45 minutes.  Feels he is allergic.  Please advise.

## 2012-10-31 NOTE — Patient Instructions (Signed)
1.  Follow up Oct 2 at 1:00 pm 2.  Your artane (trihexyphenidyl) has been sent to the pharmacy 3.  Hold your levodopa before you come in next visit (that morning)

## 2012-11-03 LAB — CULTURE, ROUTINE-ABSCESS
Gram Stain: NONE SEEN
Gram Stain: NONE SEEN

## 2012-11-06 ENCOUNTER — Telehealth: Payer: Self-pay | Admitting: Family Medicine

## 2012-11-06 MED ORDER — GLUCOSE BLOOD VI STRP: ORAL_STRIP | Status: DC

## 2012-11-06 NOTE — Telephone Encounter (Signed)
.  Rx Refilled for new pharm.

## 2012-11-06 NOTE — Telephone Encounter (Signed)
Pt has transferred to a different pharmacy. They need a new Rx for Freestyle Lite Test Strips. They need the quantity, directions, and diagnosis code as well.

## 2012-11-18 ENCOUNTER — Telehealth: Payer: Self-pay

## 2012-11-18 ENCOUNTER — Telehealth: Payer: Self-pay | Admitting: Family Medicine

## 2012-11-18 DIAGNOSIS — N419 Inflammatory disease of prostate, unspecified: Secondary | ICD-10-CM

## 2012-11-18 NOTE — Telephone Encounter (Signed)
Per my last note, I refilled a years worth of aricept a few weeks ago and he told me he was off of the cymbalta (sarah, please confirm with pt/family and also make sure that you check med hx to make sure that it wasn't just refilled, etc when you get requests.  Thx!)

## 2012-11-18 NOTE — Telephone Encounter (Signed)
Called patient and he confirmed that he is not taking Cymbalta anymore and therefore I faxed pharmacy back to inform them of that. Also faxed pharmacy to inform them that the Aricept was refilled on 10-31-12.

## 2012-11-18 NOTE — Telephone Encounter (Addendum)
Tamsulosin HCL 0.4 mg cap 1 QD #90 Metformin HCL 500 mg tab 2 BID 90 day supply

## 2012-11-18 NOTE — Telephone Encounter (Signed)
Donepezil and Duloxetine refill requested

## 2012-11-19 MED ORDER — TAMSULOSIN HCL 0.4 MG PO CAPS: 0.4000 mg | ORAL_CAPSULE | Freq: Every day | ORAL | Status: DC

## 2012-11-19 MED ORDER — METFORMIN HCL 500 MG PO TABS: 1000.0000 mg | ORAL_TABLET | Freq: Two times a day (BID) | ORAL | Status: DC

## 2012-11-19 NOTE — Telephone Encounter (Signed)
Rx Refilled  

## 2012-11-27 ENCOUNTER — Encounter: Payer: Self-pay | Admitting: Neurology

## 2012-11-27 ENCOUNTER — Ambulatory Visit (INDEPENDENT_AMBULATORY_CARE_PROVIDER_SITE_OTHER): Payer: Medicare Other | Admitting: Neurology

## 2012-11-27 VITALS — BP 94/58 | HR 80 | Temp 97.9°F | Resp 16 | Wt 225.5 lb

## 2012-11-27 DIAGNOSIS — M25561 Pain in right knee: Secondary | ICD-10-CM

## 2012-11-27 DIAGNOSIS — I959 Hypotension, unspecified: Secondary | ICD-10-CM

## 2012-11-27 DIAGNOSIS — M25569 Pain in unspecified knee: Secondary | ICD-10-CM

## 2012-11-27 DIAGNOSIS — G20 Parkinson's disease: Secondary | ICD-10-CM

## 2012-11-27 NOTE — Progress Notes (Signed)
Jeremy Terry was seen today in the movement disorders clinic for neurologic consultation at the request of [name] [name], MD.  He was previously seen by Dr. [name].  They're requesting a second opinion regarding possible DBS surgery.  He has an appointment with [facility] in this regard as well.  The pt began with L hand tremor that began in 07/2009.  He was started on requip in 06/2010, but after 2 years on the medication, it was tapered off just 2 days ago because of compulsive spending.  When he was initally placed on the medication he had some nausea but his wife did think that it was effective. He has been on levodopa since 09/2011.  This was definitely effective.  He takes 1 1/2 tablets three to four times per day.  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  His wife notices that the medication may wear off about 30 minutes to one hour before the next dosage, as she will notice a resurgence of tremor.  He takes medication at 5 AM, 12 PM, often misses the 3 PM dose and then was taken another at 6 PM.  10/03/12:   The patient presents today with his wife who supplements the history.  They report that he did not get started on Comtan.  His pharmacy told them that it was backordered.  However, when we called the pharmacy and they stated that they had not received the prescription.  The patient did have a fall on July 16 resulting in a wrist fracture.  He was outside and fell over a bucket when he tripped. 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  He is currently taking his carbidopa/levodopa 25/100, 1-1/2 tablets at 8 AM/12 PM/3 M/6 PM.  He cannot tell when the medication wears off.  His wife states that it definitely does help.  The patient did go to therapy.  It was recommended that he use a cane.  He has not been doing that.  His wife recalls that he has been very impulsive.  He  is having major personality change.  He curses a lot and he never did this previously.  He has no hallucinations.  He is having intermittent diplopia.  10/31/12 update:  The pt presents with his wife who supplements the hx.  His Aricept was increased last time to 10 mg daily.  His Requip was d/c.  Is not sure of the levodopa is helping but he continues to take carbidopa/levodopa 25/100, 1-1/2 tablets 4 times per day.  I did discontinue his Cymbalta last visit as he was using it for peripheral neuropathy, but had no symptoms of paresthesias.  He noticed no increase in paresthesias with the discontinuation of Cymbalta.  His mood remains good.  His wife states that his personality has been much better and he is not cursing like he was.  He continues to have some intermittent double vision.  He is exercising some, although not necessarily faithfully.  11/27/12 update:  This patient is accompanied in the office by his spouse who supplements the history.  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  He is currently on Aricept 10 mg daily.  He is on carbidopa/levodopa 25/100, 1-1/2 tablets 4 times per day.  Last visit, I cautiously added Artane, because of his biggest complaint of tremor.  He reports that tremor is markedly better.  He did hold his carbidopa/levodopa at my request prior to coming into the office today.  He really feels no different off of the medication.  His biggest complaint is significant knee pain.  It is preventing him from exercising.  I did review his MRI of the brain that was recently done.  There was ventricular prominence, but I thought that the frontotemporal atrophy was greater than the atrophy posteriorly.  PREVIOUS MEDICATIONS: Requip (compulsive spending), Cymbalta  ALLERGIES:   Allergies  Allergen Reactions  . Hydrocodone   . Morphine   . Sulfa Antibiotics     CURRENT MEDICATIONS:  Current Outpatient Prescriptions on File Prior to  Visit  Medication Sig Dispense Refill  . aspirin 81 MG tablet Take 81 mg by mouth daily.        Marland Kitchen atorvastatin (LIPITOR) 40 MG tablet TAKE ONE TABLET BY MOUTH DAILY  30 tablet  10  . carbidopa-levodopa (SINEMET CR) 25-100 MG per tablet Take 1 tablet by mouth. One and half 4 times a day.      . clopidogrel (PLAVIX) 75 MG tablet Take 1 tablet (75 mg total) by mouth daily.  30 tablet  12  . donepezil (ARICEPT) 10 MG tablet Take 1 tablet (10 mg total) by mouth at bedtime.  90 tablet  3  . DULoxetine (CYMBALTA) 30 MG capsule Take 1 capsule (30 mg total) by mouth daily.  30 capsule  3  . fexofenadine (ALLEGRA) 60 MG tablet Take 60 mg by mouth daily.      Marland Kitchen glucose blood (FREESTYLE LITE) test strip Check blood sugar daily and prn - Dx:250.00  100 each  3  . hydrochlorothiazide (HYDRODIURIL) 25 MG tablet Take 1 tablet (25 mg total) by mouth daily.  30 tablet  5  . hydrOXYzine (ATARAX/VISTARIL) 25 MG tablet Take 25 mg by mouth daily.        Marland Kitchen losartan (COZAAR) 100 MG tablet Take 1 tablet (100 mg total) by mouth daily.  30 tablet  6  . metFORMIN (GLUCOPHAGE) 500 MG tablet Take 2 tablets (1,000 mg total) by mouth 2 (two) times daily with a meal.  360 tablet  1  . metoprolol succinate (TOPROL-XL) 50 MG 24 hr tablet Take 1 tablet (50 mg total) by mouth daily.  30 tablet  6  . Multiple Vitamin (MULTIVITAMIN) capsule Take 1 capsule by mouth daily.        . nitroGLYCERIN (NITROSTAT) 0.4 MG SL tablet Place 1 tablet (0.4 mg total) under the tongue every 5 (five) minutes as needed.  25 tablet  11  . sulfamethoxazole-trimethoprim (BACTRIM DS) 800-160 MG per tablet Take 1 tablet by mouth 2 (two) times daily.  14 tablet  0  . tamsulosin (FLOMAX) 0.4 MG CAPS capsule Take 1 capsule (0.4 mg total) by mouth daily.  90 capsule  1  . trihexyphenidyl (ARTANE) 2 MG tablet Take 1 tablet (2 mg total) by mouth 2 (two) times daily with a meal.  60 tablet  3  . [DISCONTINUED] rOPINIRole (REQUIP) 1 MG tablet Take 1 mg by mouth 3  (three) times daily.        No current facility-administered medications on file prior to visit.    PAST MEDICAL HISTORY:   Past Medical History  Diagnosis Date  . Coronary artery disease   . Hypertension   . Diabetes mellitus   . Hypercholesteremia   . BPH (benign prostatic hypertrophy)   . DJD (degenerative joint disease)   .  Asthma   . Memory loss   . Ventral hernia   . GERD (gastroesophageal reflux disease)   . Allergic rhinitis   . Parkinsonian syndrome   . Hypogonadism male   . Neuropathy of leg     Bilateral  . Depression   . Parkinson disease     PAST SURGICAL HISTORY:   Past Surgical History  Procedure Laterality Date  . Coronary stent placement  2003  . Shoulder surgery  2011    rotator cuff    SOCIAL HISTORY:   History   Social History  . Marital Status: Married    Spouse Name: N/A    Number of Children: 2  . Years of Education: 14   Occupational History  . retired     Visual merchandiser   Social History Main Topics  . Smoking status: Former Smoker    Quit date: 02/26/1998  . Smokeless tobacco: Never Used  . Alcohol Use: No  . Drug Use: No  . Sexual Activity: Not on file   Other Topics Concern  . Not on file   Social History Narrative   Pt lives at home with his spouse.   Caffeine Use: Does not consume    FAMILY HISTORY:   Family Status  Relation Status Death Age  . Father Deceased 51    CHF  . Mother Deceased     pneumonia  . Child Alive     2, healthy    ROS:  A complete 10 system review of systems was obtained and was unremarkable apart from what is mentioned above.  PHYSICAL EXAMINATION:    VITALS:   Filed Vitals:   11/27/12 1235  BP: 94/58  Pulse: 80  Temp: 97.9 F (36.6 C)  Resp: 16  Weight: 225 lb 8 oz (102.286 kg)   BP Readings from Last 3 Encounters:  11/27/12 94/58  10/31/12 126/68  10/30/12 126/74    GEN:  The patient appears stated age and is in NAD. HEENT:  Normocephalic, atraumatic.  The mucous membranes are  moist. The superficial temporal arteries are without ropiness or tenderness. CV:  RRR Lungs:  CTAB Neck/HEME:  There are no carotid bruits bilaterally.  Neurological examination:  Orientation: The patient is alert and oriented x3.  A MoCA was performed and the pt scored 17/30 Cranial nerves: There is good facial symmetry.  There is facial hypomimia.  Pupils are equal round and reactive to light bilaterally. Fundoscopic exam reveals clear margins bilaterally. Extraocular muscles are intact.  There are definite square wave jerks.  The visual fields are full to confrontational testing. The speech is fluent and clear.  At times, it becomes quite hypophonic and he has significant difficulty with the guttural sounds.  Soft palate rises symmetrically and there is no tongue deviation. Hearing is intact to conversational tone. Sensation: Sensation is intact to light and pinprick throughout (facial, trunk, extremities). Vibration is decreased in a distal fashion. There is no extinction with double simultaneous stimulation. There is no sensory dermatomal level identified. Motor: Strength is 5/5 in the bilateral upper and lower extremities.   Shoulder shrug is equal and symmetric.  There is no pronator drift. Deep tendon reflexes: Deep tendon reflexes are 3/4 at the bilateral biceps, triceps, brachioradialis, patella and achilles. Plantar response is upgoing on the left and downgoing on the right.  Movement examination: Tone: There is normal tone in the bilateral upper extremities.  The tone in the lower extremities is normal.  Abnormal movements: There is a mild  to moderate left upper extremity resting tremor. There is no tremor in the legs her head. Coordination:  There is some decremation with hand opening and closing on the left, but finger taps were good bilaterally.  He has difficulty with heel taps and toe taps bilaterally. Gait and Station: The patient has no difficulty arising out of a deep-seated  chair without the use of the hands. The patient's stride length is normal, but the left upper extremity resting tremor does increase with ambulation.  The patient has a negative pull test.      Labs: I got lab work from his primary care physician dated September 08, 2012; his sodium was 138, potassium 4.4, chloride 96, CO2 26, BUN 18 creatinine 1.00.  Hemoglobin A1c was 6.3.  White blood cells were 9.4, hemoglobin 16.3, hematocrit 46.7 and platelets 300.  ASSESSMENT/PLAN:  1.  Parkinsonism.  -He does have some atypical features that are worrisome for one of the atypical parkinsonian states.  He is hyperreflexic, has an upgoing toe on the right, has square wave jerks, has diplopia and has had personality changes.  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  -He will continue on the aricept 10 mg daily  -I. am actually going to have him completely hold his carbidopa/levodopa as he looked good in the office today and had not taken the medication all day.  -He is doing better on the Artane, and even that this medication can cause significant cognitive side effects, it does not appear that he is having any and we decided to keep him on it for now. 2.  Knee pain  -I. am going to make him a followup with his primary care physician. 3.  Low blood pressure.  -Fortunately, he is asymptomatic, but parkinsonism can cause drops in blood pressure.  I suspect that he may not need as much blood pressure medication any longer.  I asked him to talk about this with his primary care physician after I make the appointment for him. 4.  Peripheral neuropathy, likely secondary to diabetes.  -He is off of Cymbalta and doing well. 35.  I will followup with patient in a few months, sooner should new neurologic issues arise.

## 2012-11-27 NOTE — Progress Notes (Signed)
MOCA Results  Visuospatial/Executive - 1  Naming - 3  Attention - 2       1       1    Language - 0         0  Abstraction - 0  Delayed recall - 3  Orientation - 6  Total = 17

## 2012-11-27 NOTE — Patient Instructions (Addendum)
1.  Hold taking your levodopa for now 2.  Make a follow up appointment for the end of December on your way out today. Thank you!

## 2012-11-28 ENCOUNTER — Encounter: Payer: Self-pay | Admitting: Family Medicine

## 2012-11-28 ENCOUNTER — Ambulatory Visit (INDEPENDENT_AMBULATORY_CARE_PROVIDER_SITE_OTHER): Payer: Medicare Other | Admitting: Family Medicine

## 2012-11-28 VITALS — BP 120/60 | HR 76 | Temp 98.5°F | Resp 18 | Wt 227.0 lb

## 2012-11-28 DIAGNOSIS — Z23 Encounter for immunization: Secondary | ICD-10-CM

## 2012-11-28 DIAGNOSIS — M25561 Pain in right knee: Secondary | ICD-10-CM

## 2012-11-28 DIAGNOSIS — M25569 Pain in unspecified knee: Secondary | ICD-10-CM

## 2012-11-28 MED ORDER — MELOXICAM 15 MG PO TABS: 15.0000 mg | ORAL_TABLET | Freq: Every day | ORAL | Status: DC

## 2012-11-28 NOTE — Progress Notes (Signed)
Subjective:    Patient ID: Jeremy Terry, male    DOB: [birth year], 67 y.o.   MRN: [id number]  HPI  Patient was seen by his neurologist yesterday and found to have a low blood pressure 98/50's.  He was asymptomatic at the time. He denies orthostasis. He denies syncope. He denies presyncope. He denies chest pain, shortness of breath, dyspnea on exertion. He has not been bleeding recently. He denies any melena. Today in clinic his blood pressure is 120/60. He also comes in complaining of several weeks of gradual onset of bilateral knee pain. The pain is worse rising from a seated position. It is worse getting out of cars or standing from a squatted position. He began noticing the pain after you've started exercising more regularly. He denies any erythema or effusion. He denies any crepitus in the knee joint. Past Medical History  Diagnosis Date  . Coronary artery disease   . Hypertension   . Diabetes mellitus   . Hypercholesteremia   . BPH (benign prostatic hypertrophy)   . DJD (degenerative joint disease)   . Asthma   . Memory loss   . Ventral hernia   . GERD (gastroesophageal reflux disease)   . Allergic rhinitis   . Parkinsonian syndrome   . Hypogonadism male   . Neuropathy of leg     Bilateral  . Depression   . Parkinson disease    Past Surgical History  Procedure Laterality Date  . Coronary stent placement  2003  . Shoulder surgery  2011    rotator cuff   Current Outpatient Prescriptions on File Prior to Visit  Medication Sig Dispense Refill  . aspirin 81 MG tablet Take 81 mg by mouth daily.        Marland Kitchen atorvastatin (LIPITOR) 40 MG tablet TAKE ONE TABLET BY MOUTH DAILY  30 tablet  10  . carbidopa-levodopa (SINEMET CR) 25-100 MG per tablet Take 1 tablet by mouth. One and half 4 times a day.      . clopidogrel (PLAVIX) 75 MG tablet Take 1 tablet (75 mg total) by mouth daily.  30 tablet  12  . donepezil (ARICEPT) 10 MG tablet Take 1 tablet (10 mg total) by mouth at bedtime.  90  tablet  3  . DULoxetine (CYMBALTA) 30 MG capsule Take 1 capsule (30 mg total) by mouth daily.  30 capsule  3  . fexofenadine (ALLEGRA) 60 MG tablet Take 60 mg by mouth daily.      Marland Kitchen glucose blood (FREESTYLE LITE) test strip Check blood sugar daily and prn - Dx:250.00  100 each  3  . hydrochlorothiazide (HYDRODIURIL) 25 MG tablet Take 1 tablet (25 mg total) by mouth daily.  30 tablet  5  . hydrOXYzine (ATARAX/VISTARIL) 25 MG tablet Take 25 mg by mouth daily.        Marland Kitchen losartan (COZAAR) 100 MG tablet Take 1 tablet (100 mg total) by mouth daily.  30 tablet  6  . metFORMIN (GLUCOPHAGE) 500 MG tablet Take 2 tablets (1,000 mg total) by mouth 2 (two) times daily with a meal.  360 tablet  1  . metoprolol succinate (TOPROL-XL) 50 MG 24 hr tablet Take 1 tablet (50 mg total) by mouth daily.  30 tablet  6  . Multiple Vitamin (MULTIVITAMIN) capsule Take 1 capsule by mouth daily.        . nitroGLYCERIN (NITROSTAT) 0.4 MG SL tablet Place 1 tablet (0.4 mg total) under the tongue every 5 (five) minutes as needed.  25 tablet  11  . sulfamethoxazole-trimethoprim (BACTRIM DS) 800-160 MG per tablet Take 1 tablet by mouth 2 (two) times daily.  14 tablet  0  . tamsulosin (FLOMAX) 0.4 MG CAPS capsule Take 1 capsule (0.4 mg total) by mouth daily.  90 capsule  1  . trihexyphenidyl (ARTANE) 2 MG tablet Take 1 tablet (2 mg total) by mouth 2 (two) times daily with a meal.  60 tablet  3  . [DISCONTINUED] rOPINIRole (REQUIP) 1 MG tablet Take 1 mg by mouth 3 (three) times daily.        No current facility-administered medications on file prior to visit.   Allergies  Allergen Reactions  . Hydrocodone   . Morphine   . Sulfa Antibiotics    History   Social History  . Marital Status: Married    Spouse Name: N/A    Number of Children: 2  . Years of Education: 14   Occupational History  . retired     Visual merchandiser   Social History Main Topics  . Smoking status: Former Smoker    Quit date: 02/26/1998  . Smokeless tobacco:  Never Used  . Alcohol Use: No  . Drug Use: No  . Sexual Activity: Not on file   Other Topics Concern  . Not on file   Social History Narrative   Pt lives at home with his spouse.   Caffeine Use: Does not consume     Review of Systems  All other systems reviewed and are negative.       Objective:   Physical Exam  Vitals reviewed. Cardiovascular: Normal rate, regular rhythm and normal heart sounds.   Pulmonary/Chest: Effort normal and breath sounds normal. No respiratory distress. He has no wheezes. He has no rales.  Abdominal: Soft. Bowel sounds are normal. He exhibits no distension. There is no tenderness. There is no rebound.  Musculoskeletal:       Right knee: He exhibits normal range of motion, no swelling, no effusion, no LCL laxity, normal patellar mobility, no bony tenderness, normal meniscus and no MCL laxity. No tenderness found. No medial joint line and no lateral joint line tenderness noted.       Left knee: He exhibits normal range of motion, no swelling, no effusion, normal alignment, no LCL laxity, normal patellar mobility, normal meniscus and no MCL laxity. No tenderness found. No medial joint line and no lateral joint line tenderness noted.          Assessment & Plan:  1. Knee pain, bilateral I suspect early onset osteoarthritis.  Begin meloxicam 15 mg by mouth daily as needed for knee pain. I warned the patient about the gastrointestinal side effects of continuous use. I recommended taking breaks in the medication as possible. Consider cortisone injections if no better and x-rays of the knee is no better. Regarding his blood pressure, it is normal today. However given the recent low blood pressure, I recommended temporarily discontinuing hydrochlorothiazide.  Monitor blood pressure closely at home. Greater than 140/90 present medications. - meloxicam (MOBIC) 15 MG tablet; Take 1 tablet (15 mg total) by mouth daily.  Dispense: 30 tablet; Refill: 2

## 2012-11-28 NOTE — Addendum Note (Signed)
Addended by: Legrand Rams B on: 11/28/2012 02:45 PM   Modules accepted: Orders

## 2012-12-19 ENCOUNTER — Telehealth: Payer: Self-pay | Admitting: Family Medicine

## 2012-12-19 MED ORDER — CLOPIDOGREL BISULFATE 75 MG PO TABS: 75.0000 mg | ORAL_TABLET | Freq: Every day | ORAL | Status: DC

## 2012-12-19 NOTE — Telephone Encounter (Signed)
Meds refilled.

## 2012-12-19 NOTE — Telephone Encounter (Signed)
Needs Clopidogrel 75 mg Rx called in to [facility] [place].  They are changing drug stores

## 2012-12-26 ENCOUNTER — Encounter: Payer: Self-pay | Admitting: Family Medicine

## 2012-12-26 ENCOUNTER — Ambulatory Visit (INDEPENDENT_AMBULATORY_CARE_PROVIDER_SITE_OTHER): Payer: Medicare Other | Admitting: Family Medicine

## 2012-12-26 VITALS — BP 130/74 | HR 82 | Temp 98.0°F | Resp 18 | Wt 220.0 lb

## 2012-12-26 DIAGNOSIS — G20 Parkinson's disease: Secondary | ICD-10-CM

## 2012-12-26 DIAGNOSIS — R358 Other polyuria: Secondary | ICD-10-CM

## 2012-12-26 LAB — CBC WITH DIFFERENTIAL/PLATELET
Basophils Absolute: 0 10*3/uL (ref 0.0–0.1)
Eosinophils Relative: 2 % (ref 0–5)
HCT: 42.7 % (ref 39.0–52.0)
Lymphocytes Relative: 19 % (ref 12–46)
MCHC: 35.4 g/dL (ref 30.0–36.0)
MCV: 92.6 fL (ref 78.0–100.0)
Monocytes Absolute: 0.8 10*3/uL (ref 0.1–1.0)
Neutrophils Relative %: 68 % (ref 43–77)
Platelets: 278 10*3/uL (ref 150–400)
RDW: 13.8 % (ref 11.5–15.5)
WBC: 7.2 10*3/uL (ref 4.0–10.5)

## 2012-12-26 LAB — URINALYSIS, ROUTINE W REFLEX MICROSCOPIC
Glucose, UA: NEGATIVE mg/dL
Ketones, ur: NEGATIVE mg/dL
Nitrite: NEGATIVE
Protein, ur: NEGATIVE mg/dL

## 2012-12-26 LAB — URINALYSIS, MICROSCOPIC ONLY

## 2012-12-26 NOTE — Progress Notes (Signed)
Subjective:    Patient ID: Jeremy Terry, male    DOB: Sep 29, 1945, 67 y.o.   MRN: [id number]  HPI Patient's neurologist discontinued Sinemet one month ago.  Patient and his wife report a rapid deterioration in his Parkinson's like syndrome ever since. His resting tremor has worsened dramatically. It is now affecting both arms. He has severe bradykinesia. He has noticed increased memory loss. This is altered over the last 3 weeks. They deny any fevers chills nausea vomiting diarrhea or cough or other signs of systemic illness. He does report polyuria. He denies dysuria or hematuria. He reports frequency. He also reports hesitancy and weak stream. He also reports urging continence. His urinalysis today in clinic is relatively benign except for some mild hematuria. His prostate is +2 on examination but is nontender.  Past Medical History  Diagnosis Date  . Coronary artery disease   . Hypertension   . Diabetes mellitus   . Hypercholesteremia   . BPH (benign prostatic hypertrophy)   . DJD (degenerative joint disease)   . Asthma   . Memory loss   . Ventral hernia   . GERD (gastroesophageal reflux disease)   . Allergic rhinitis   . Parkinsonian syndrome   . Hypogonadism male   . Neuropathy of leg     Bilateral  . Depression   . Parkinson disease    Past Surgical History  Procedure Laterality Date  . Coronary stent placement  2003  . Shoulder surgery  2011    rotator cuff   Current Outpatient Prescriptions on File Prior to Visit  Medication Sig Dispense Refill  . aspirin 81 MG tablet Take 81 mg by mouth daily.        Marland Kitchen atorvastatin (LIPITOR) 40 MG tablet TAKE ONE TABLET BY MOUTH DAILY  30 tablet  10  . carbidopa-levodopa (SINEMET CR) 25-100 MG per tablet Take 1 tablet by mouth. One and half 4 times a day.      . clopidogrel (PLAVIX) 75 MG tablet Take 1 tablet (75 mg total) by mouth daily.  30 tablet  12  . donepezil (ARICEPT) 10 MG tablet Take 1 tablet (10 mg total) by mouth at bedtime.   90 tablet  3  . DULoxetine (CYMBALTA) 30 MG capsule Take 1 capsule (30 mg total) by mouth daily.  30 capsule  3  . fexofenadine (ALLEGRA) 60 MG tablet Take 60 mg by mouth daily.      Marland Kitchen glucose blood (FREESTYLE LITE) test strip Check blood sugar daily and prn - Dx:250.00  100 each  3  . hydrOXYzine (ATARAX/VISTARIL) 25 MG tablet Take 25 mg by mouth daily.        Marland Kitchen losartan (COZAAR) 100 MG tablet Take 1 tablet (100 mg total) by mouth daily.  30 tablet  6  . meloxicam (MOBIC) 15 MG tablet Take 1 tablet (15 mg total) by mouth daily.  30 tablet  2  . metFORMIN (GLUCOPHAGE) 500 MG tablet Take 2 tablets (1,000 mg total) by mouth 2 (two) times daily with a meal.  360 tablet  1  . metoprolol succinate (TOPROL-XL) 50 MG 24 hr tablet Take 1 tablet (50 mg total) by mouth daily.  30 tablet  6  . Multiple Vitamin (MULTIVITAMIN) capsule Take 1 capsule by mouth daily.        . nitroGLYCERIN (NITROSTAT) 0.4 MG SL tablet Place 1 tablet (0.4 mg total) under the tongue every 5 (five) minutes as needed.  25 tablet  11  . tamsulosin (  FLOMAX) 0.4 MG CAPS capsule Take 1 capsule (0.4 mg total) by mouth daily.  90 capsule  1  . trihexyphenidyl (ARTANE) 2 MG tablet Take 1 tablet (2 mg total) by mouth 2 (two) times daily with a meal.  60 tablet  3  . hydrochlorothiazide (HYDRODIURIL) 25 MG tablet Take 1 tablet (25 mg total) by mouth daily.  30 tablet  5  . [DISCONTINUED] rOPINIRole (REQUIP) 1 MG tablet Take 1 mg by mouth 3 (three) times daily.        No current facility-administered medications on file prior to visit.   Allergies  Allergen Reactions  . Hydrocodone   . Morphine   . Sulfa Antibiotics    History   Social History  . Marital Status: Married    Spouse Name: N/A    Number of Children: 2  . Years of Education: 14   Occupational History  . retired     Visual merchandiser   Social History Main Topics  . Smoking status: Former Smoker    Quit date: 02/26/1998  . Smokeless tobacco: Never Used  . Alcohol Use: No   . Drug Use: No  . Sexual Activity: Not on file   Other Topics Concern  . Not on file   Social History Narrative   Pt lives at home with his spouse.   Caffeine Use: Does not consume      Review of Systems  All other systems reviewed and are negative.       Objective:   Physical Exam  Vitals reviewed. Cardiovascular: Normal rate, regular rhythm, normal heart sounds and intact distal pulses.   No murmur heard. Pulmonary/Chest: Breath sounds normal. No respiratory distress. He has no wheezes. He has no rales.  Abdominal: Soft. Bowel sounds are normal. He exhibits no distension. There is no tenderness. There is no rebound and no guarding.  Patient has a +2 nontender prostate. He has severe bradykinesia. He has a bilateral resting tremor in both arms. He has a flat affect.          Assessment & Plan:  1. Polyuria C. No evidence of a urinary tract infection or prostatitis. I believe his symptoms are likely due to BPH that is made worse by numerous anti-cholinergic agents including hydroxyzine and artane. I recommended he discontinue hydroxyzine.  I do not want to discontinue the Artane as I did not prescribe that and I believe that'll make his Parkinson's symptoms worse.   Check a urine culture. If labs are normal I would suggest switching Flomax to rapaflo.   - Urinalysis, Routine w reflex microscopic - CBC with Differential - COMPLETE METABOLIC PANEL WITH GFR  2. Parkinson disease Resume Sinemet 25/100 QID as the patient was taking that previously and recheck next week

## 2012-12-26 NOTE — Addendum Note (Signed)
Addended by: Reginia Forts on: 12/26/2012 04:51 PM   Modules accepted: Orders

## 2012-12-27 LAB — COMPLETE METABOLIC PANEL WITH GFR
AST: 27 U/L (ref 0–37)
BUN: 12 mg/dL (ref 6–23)
CO2: 24 mEq/L (ref 19–32)
Calcium: 9.5 mg/dL (ref 8.4–10.5)
Chloride: 102 mEq/L (ref 96–112)
Creat: 0.87 mg/dL (ref 0.50–1.35)
GFR, Est African American: >89 mL/min
GFR, Est Non African American: 89 mL/min
Potassium: 3.9 mEq/L (ref 3.5–5.3)
Sodium: 138 mEq/L (ref 135–145)
Total Protein: 6.3 g/dL (ref 6.0–8.3)

## 2012-12-29 ENCOUNTER — Encounter: Payer: Self-pay | Admitting: Neurology

## 2012-12-29 ENCOUNTER — Ambulatory Visit (INDEPENDENT_AMBULATORY_CARE_PROVIDER_SITE_OTHER): Payer: Medicare Other | Admitting: Neurology

## 2012-12-29 ENCOUNTER — Telehealth: Payer: Self-pay | Admitting: Family Medicine

## 2012-12-29 VITALS — BP 142/78 | HR 60 | Temp 97.6°F | Resp 12 | Ht 69.0 in | Wt 222.0 lb

## 2012-12-29 DIAGNOSIS — G20 Parkinson's disease: Secondary | ICD-10-CM

## 2012-12-29 DIAGNOSIS — G20A1 Parkinson's disease without dyskinesia, without mention of fluctuations: Secondary | ICD-10-CM

## 2012-12-29 MED ORDER — CARBIDOPA-LEVODOPA 25-100 MG PO TABS: 1.0000 | ORAL_TABLET | Freq: Four times a day (QID) | ORAL | Status: DC

## 2012-12-29 MED ORDER — DONEPEZIL HCL 10 MG PO TABS: 10.0000 mg | ORAL_TABLET | Freq: Every day | ORAL | Status: DC

## 2012-12-29 NOTE — Progress Notes (Signed)
Jeremy Terry was seen today in the movement disorders clinic for neurologic consultation at the request of [name], MD.  He was previously seen by Dr. [name].  They're requesting a second opinion regarding possible DBS surgery.  He has an appointment with [facility] in this regard as well.  The pt began with L hand tremor that began in 07/2009.  He was started on requip in 06/2010, but after 2 years on the medication, it was tapered off just 2 days ago because of compulsive spending.  When he was initally placed on the medication he had some nausea but his wife did think that it was effective. He has been on levodopa since 09/2011.  This was definitely effective.  He takes 1 1/2 tablets three to four times per day.  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  His wife notices that the medication may wear off about 30 minutes to one hour before the next dosage, as she will notice a resurgence of tremor.  He takes medication at 5 AM, 12 PM, often misses the 3 PM dose and then was taken another at 6 PM.  10/03/12:   The patient presents today with his wife who supplements the history.  They report that he did not get started on Comtan.  His pharmacy told them that it was backordered.  However, when we called the pharmacy and they stated that they had not received the prescription.  The patient did have a fall on July 16 resulting in a wrist fracture.  He was outside and fell over a bucket when he tripped. 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  He is currently taking his carbidopa/levodopa 25/100, 1-1/2 tablets at 8 AM/12 PM/3 M/6 PM.  He cannot tell when the medication wears off.  His wife states that it definitely does help.  The patient did go to therapy.  It was recommended that he use a cane.  He has not been doing that.  His wife recalls that he has been very impulsive.  He  is having major personality change.  He curses a lot and he never did this previously.  He has no hallucinations.  He is having intermittent diplopia.  10/31/12 update:  The pt presents with his wife who supplements the hx.  His Aricept was increased last time to 10 mg daily.  His Requip was d/c.  Is not sure of the levodopa is helping but he continues to take carbidopa/levodopa 25/100, 1-1/2 tablets 4 times per day.  I did discontinue his Cymbalta last visit as he was using it for peripheral neuropathy, but had no symptoms of paresthesias.  He noticed no increase in paresthesias with the discontinuation of Cymbalta.  His mood remains good.  His wife states that his personality has been much better and he is not cursing like he was.  He continues to have some intermittent double vision.  He is exercising some, although not necessarily faithfully.  11/27/12 update:  This patient is accompanied in the office by his spouse who supplements the history.  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  He is currently on Aricept 10 mg daily.  He is on carbidopa/levodopa 25/100, 1-1/2 tablets 4 times per day.  Last visit, I cautiously added Artane, because of his biggest complaint of tremor.  He reports that tremor is markedly better.  He did hold his carbidopa/levodopa at my request prior to coming into the office today.  He really feels no different off of the medication.  His biggest complaint is significant knee pain.  It is preventing him from exercising.  I did review his MRI of the brain that was recently done.  There was ventricular prominence, but I thought that the frontotemporal atrophy was greater than the atrophy posteriorly.  12/29/12 update:  This patient is accompanied in the office by his spouse who supplements the history.  He is on artane and doing well.  Last visit, he did not think that the carbidopa/levodopa 25/100 was helpful and so we stopped it.  He got much  worse after this was stopped (falls, trouble getting out of the bed) and so he went to the PCP on Friday and it was restarted.    He is feeling much better.  His mood is better.  There are no hallucinations.  He does c/o hypophonic speech that is worsening.  PREVIOUS MEDICATIONS: Requip (compulsive spending), Cymbalta  ALLERGIES:   Allergies  Allergen Reactions  . Hydrocodone   . Morphine   . Sulfa Antibiotics     CURRENT MEDICATIONS:  Current Outpatient Prescriptions on File Prior to Visit  Medication Sig Dispense Refill  . aspirin 81 MG tablet Take 81 mg by mouth daily.        Marland Kitchen atorvastatin (LIPITOR) 40 MG tablet TAKE ONE TABLET BY MOUTH DAILY  30 tablet  10  . clopidogrel (PLAVIX) 75 MG tablet Take 1 tablet (75 mg total) by mouth daily.  30 tablet  12  . fexofenadine (ALLEGRA) 60 MG tablet Take 60 mg by mouth daily.      Marland Kitchen glucose blood (FREESTYLE LITE) test strip Check blood sugar daily and prn - Dx:250.00  100 each  3  . hydrOXYzine (ATARAX/VISTARIL) 25 MG tablet Take 25 mg by mouth daily.        Marland Kitchen losartan (COZAAR) 100 MG tablet Take 1 tablet (100 mg total) by mouth daily.  30 tablet  6  . meloxicam (MOBIC) 15 MG tablet Take 1 tablet (15 mg total) by mouth daily.  30 tablet  2  . metFORMIN (GLUCOPHAGE) 500 MG tablet Take 2 tablets (1,000 mg total) by mouth 2 (two) times daily with a meal.  360 tablet  1  . metoprolol succinate (TOPROL-XL) 50 MG 24 hr tablet Take 1 tablet (50 mg total) by mouth daily.  30 tablet  6  . Multiple Vitamin (MULTIVITAMIN) capsule Take 1 capsule by mouth daily.        . nitroGLYCERIN (NITROSTAT) 0.4 MG SL tablet Place 1 tablet (0.4 mg total) under the tongue every 5 (five) minutes as needed.  25 tablet  11  . tamsulosin (FLOMAX) 0.4 MG CAPS capsule Take 1 capsule (0.4 mg total) by mouth daily.  90 capsule  1  . trihexyphenidyl (ARTANE) 2 MG tablet Take 1 tablet (2 mg total) by mouth 2 (two) times daily with a meal.  60 tablet  3  . hydrochlorothiazide  (HYDRODIURIL) 25 MG tablet Take 1 tablet (25 mg total) by mouth daily.  30 tablet  5  . [DISCONTINUED] rOPINIRole (REQUIP) 1 MG tablet Take 1 mg by mouth 3 (three) times daily.        No current facility-administered medications on file prior to visit.    PAST MEDICAL HISTORY:   Past Medical History  Diagnosis Date  .  Coronary artery disease   . Hypertension   . Diabetes mellitus   . Hypercholesteremia   . BPH (benign prostatic hypertrophy)   . DJD (degenerative joint disease)   . Asthma   . Memory loss   . Ventral hernia   . GERD (gastroesophageal reflux disease)   . Allergic rhinitis   . Parkinsonian syndrome   . Hypogonadism male   . Neuropathy of leg     Bilateral  . Depression   . Parkinson disease     PAST SURGICAL HISTORY:   Past Surgical History  Procedure Laterality Date  . Coronary stent placement  2003  . Shoulder surgery  2011    rotator cuff    SOCIAL HISTORY:   History   Social History  . Marital Status: Married    Spouse Name: N/A    Number of Children: 2  . Years of Education: 14   Occupational History  . retired     Visual merchandiser   Social History Main Topics  . Smoking status: Former Smoker    Quit date: 02/26/1998  . Smokeless tobacco: Never Used  . Alcohol Use: Yes     Comment: Occ  . Drug Use: No  . Sexual Activity: Not on file   Other Topics Concern  . Not on file   Social History Narrative   Pt lives at home with his spouse.   Caffeine Use: Does not consume    FAMILY HISTORY:   Family Status  Relation Status Death Age  . Father Deceased 16    CHF  . Mother Deceased     pneumonia  . Child Alive     2, healthy    ROS:  A complete 10 system review of systems was obtained and was unremarkable apart from what is mentioned above.  PHYSICAL EXAMINATION:    VITALS:   Filed Vitals:   12/29/12 0949  BP: 142/78  Pulse: 60  Temp: 97.6 F (36.4 C)  Resp: 12  Height: 5\' 9"  (1.753 m)  Weight: 222 lb (100.699 kg)   BP Readings  from Last 3 Encounters:  12/29/12 142/78  12/26/12 130/74  11/28/12 120/60    GEN:  The patient appears stated age and is in NAD. HEENT:  Normocephalic, atraumatic.  The mucous membranes are moist. The superficial temporal arteries are without ropiness or tenderness. CV:  RRR Lungs:  CTAB Neck/HEME:  There are no carotid bruits bilaterally.  Neurological examination:  Orientation: The patient is alert and oriented x3.  A MoCA was performed and the pt scored 17/30 Cranial nerves: There is good facial symmetry.  There is facial hypomimia.  Pupils are equal round and reactive to light bilaterally. Fundoscopic exam reveals clear margins bilaterally. Extraocular muscles are intact.  There are definite square wave jerks.  The visual fields are full to confrontational testing. The speech is fluent and clear.  At times, it becomes quite hypophonic and he has significant difficulty with the guttural sounds.  Soft palate rises symmetrically and there is no tongue deviation. Hearing is intact to conversational tone. Sensation: Sensation is intact to light and pinprick throughout (facial, trunk, extremities). Vibration is decreased in a distal fashion. There is no extinction with double simultaneous stimulation. There is no sensory dermatomal level identified. Motor: Strength is 5/5 in the bilateral upper and lower extremities.   Shoulder shrug is equal and symmetric.  There is no pronator drift. Deep tendon reflexes: Deep tendon reflexes are 3/4 at the bilateral biceps, triceps, brachioradialis, patella  and achilles. Plantar response is upgoing on the left and downgoing on the right.  Movement examination: Tone: There is normal tone in the bilateral upper extremities.  The tone in the lower extremities is normal.  Abnormal movements: There is a mild to moderate left upper extremity resting tremor. There is no tremor in the legs Coordination:  There is some decremation with hand opening and closing on the  left, but finger taps were good bilaterally.  He has difficulty with heel taps and toe taps bilaterally. Gait and Station: The patient has no difficulty arising out of a deep-seated chair without the use of the hands. The patient's stride length is normal, but the left upper extremity resting tremor does increase with ambulation.  The patient has a negative pull test.      Labs: I got lab work from his primary care physician dated September 08, 2012; his sodium was 138, potassium 4.4, chloride 96, CO2 26, BUN 18 creatinine 1.00.  Hemoglobin A1c was 6.3.  White blood cells were 9.4, hemoglobin 16.3, hematocrit 46.7 and platelets 300.  ASSESSMENT/PLAN:  1.  Parkinsonism.  -He does have some atypical features that are worrisome for one of the atypical parkinsonian states.  He is hyperreflexic, has an upgoing toe on the right, has square wave jerks, has diplopia and has had personality changes.  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  -He will continue on the aricept 10 mg daily  -He did not do well off of the levodopa and has resumed that at 7am/12pm/3pm/6pm with marked improvement in sx's  -He is doing better on the Artane, and even that this medication can cause significant cognitive side effects, it does not appear that he is having any and we decided to keep him on it for now.  -I will order ST 2.    Peripheral neuropathy, likely secondary to diabetes.  -He is off of Cymbalta and doing well. 3  I will followup with patient in a few months, sooner should new neurologic issues arise.

## 2012-12-29 NOTE — Telephone Encounter (Signed)
Need strips for Free Style Lite meter and lancets called in

## 2012-12-30 ENCOUNTER — Ambulatory Visit: Payer: Medicare Other | Admitting: Physical Therapy

## 2012-12-31 ENCOUNTER — Ambulatory Visit: Payer: Medicare Other | Attending: Neurology

## 2012-12-31 DIAGNOSIS — IMO0001 Reserved for inherently not codable concepts without codable children: Secondary | ICD-10-CM | POA: Insufficient documentation

## 2012-12-31 DIAGNOSIS — R471 Dysarthria and anarthria: Secondary | ICD-10-CM | POA: Insufficient documentation

## 2012-12-31 MED ORDER — FREESTYLE LITE DEVI: Status: DC

## 2012-12-31 MED ORDER — FREESTYLE LANCETS MISC: Status: DC

## 2012-12-31 MED ORDER — GLUCOSE BLOOD VI STRP: ORAL_STRIP | Status: DC

## 2012-12-31 NOTE — Telephone Encounter (Signed)
All dm supplies refilled

## 2013-01-02 ENCOUNTER — Ambulatory Visit: Payer: Medicare Other

## 2013-01-07 ENCOUNTER — Ambulatory Visit: Payer: Medicare Other | Admitting: Speech Pathology

## 2013-01-09 ENCOUNTER — Encounter: Payer: Self-pay | Admitting: Family Medicine

## 2013-01-09 ENCOUNTER — Ambulatory Visit: Payer: Medicare Other

## 2013-01-09 ENCOUNTER — Ambulatory Visit (INDEPENDENT_AMBULATORY_CARE_PROVIDER_SITE_OTHER): Payer: Medicare Other | Admitting: Family Medicine

## 2013-01-09 VITALS — BP 138/80 | HR 68 | Temp 98.5°F | Resp 18 | Ht 65.0 in | Wt 226.0 lb

## 2013-01-09 DIAGNOSIS — R7309 Other abnormal glucose: Secondary | ICD-10-CM

## 2013-01-09 DIAGNOSIS — N400 Enlarged prostate without lower urinary tract symptoms: Secondary | ICD-10-CM

## 2013-01-09 DIAGNOSIS — E119 Type 2 diabetes mellitus without complications: Secondary | ICD-10-CM

## 2013-01-09 DIAGNOSIS — R350 Frequency of micturition: Secondary | ICD-10-CM

## 2013-01-09 LAB — URINALYSIS, ROUTINE W REFLEX MICROSCOPIC
Hgb urine dipstick: NEGATIVE
Leukocytes, UA: NEGATIVE
Nitrite: NEGATIVE
Specific Gravity, Urine: 1.025 (ref 1.005–1.030)
Urobilinogen, UA: 1 mg/dL (ref 0.0–1.0)
pH: 7 (ref 5.0–8.0)

## 2013-01-09 LAB — HEMOGLOBIN A1C, FINGERSTICK: Hgb A1C (fingerstick): 6.2 % — ABNORMAL HIGH (ref <5.7)

## 2013-01-09 MED ORDER — GLIPIZIDE 5 MG PO TABS: 5.0000 mg | ORAL_TABLET | Freq: Two times a day (BID) | ORAL | Status: DC

## 2013-01-09 MED ORDER — SILODOSIN 8 MG PO CAPS: 8.0000 mg | ORAL_CAPSULE | Freq: Every day | ORAL | Status: DC

## 2013-01-09 NOTE — Patient Instructions (Addendum)
Start rapaflo once a day for prostate Stop metformin Start glipizide twice a day with meals for the diabetes A1C to be checked today  STOP FLOMAX F/U 4 weeks with Dr. Clifton Custard

## 2013-01-11 ENCOUNTER — Encounter: Payer: Self-pay | Admitting: Family Medicine

## 2013-01-11 NOTE — Assessment & Plan Note (Signed)
A1C looks great Will d/c metformin Change to low dose glipizide 5mg  BID, discussed hypoglycemia

## 2013-01-11 NOTE — Progress Notes (Signed)
  Subjective:    Patient ID: Jeremy Terry, male    DOB: June 18, 1945, 67 y.o.   MRN: [id number]  HPI  Pt here due to urinary frequency and diarrhea. History of Diabetes mellitus, last A1C in January shows good control, Has been on metformin 1 gram BID, has diarrhea daily after taking meds, he is finding this very bothersome and request a change in medications, denies vomiting, blood in stools, abdominal pain.  Urinary frequency and urgency, enlarged prostate currently on flomax for BPH, reviewed PCP last note considered changing to Rapaflo. He did have mild hematuria in UA last visit. Denies dysuria.   Review of Systems  GEN- denies fatigue, fever, weight loss,weakness, recent illness HEENT- denies eye drainage, change in vision, nasal discharge, CVS- denies chest pain, palpitations RESP- denies SOB, cough, wheeze ABD- denies N/V, +change in stools, abd pain GU- denies dysuria, hematuria, +dribbling, incontinence       Objective:   Physical Exam GEN- NAD, alert and oriented x3 HEENT- PERRL, EOMI, non injected sclera, pink conjunctiva, MMM, oropharynx clear CVS- RRR, no murmur RESP-CTAB ABD-NABS,soft,NT,ND, bladder not palpated, no CVA tenderness EXT- No edema Pulses- Radial 2+        Assessment & Plan:

## 2013-01-11 NOTE — Assessment & Plan Note (Signed)
Trial of Rapaflo D/C FLOMAX  Urine culutre from 10/31 neg Repeat UA no blood seen Normal renal function

## 2013-01-13 ENCOUNTER — Ambulatory Visit: Payer: Medicare Other

## 2013-01-16 ENCOUNTER — Ambulatory Visit: Payer: Medicare Other

## 2013-01-19 ENCOUNTER — Ambulatory Visit: Payer: Medicare Other | Admitting: Speech Pathology

## 2013-01-21 ENCOUNTER — Ambulatory Visit: Payer: Medicare Other

## 2013-01-24 ENCOUNTER — Other Ambulatory Visit: Payer: Self-pay | Admitting: Neurology

## 2013-01-29 ENCOUNTER — Ambulatory Visit (INDEPENDENT_AMBULATORY_CARE_PROVIDER_SITE_OTHER): Payer: Medicare Other | Admitting: Family Medicine

## 2013-01-29 ENCOUNTER — Telehealth: Payer: Self-pay | Admitting: Family Medicine

## 2013-01-29 ENCOUNTER — Encounter: Payer: Self-pay | Admitting: Family Medicine

## 2013-01-29 VITALS — BP 128/76 | HR 78 | Temp 97.6°F | Resp 18 | Wt 224.0 lb

## 2013-01-29 DIAGNOSIS — R197 Diarrhea, unspecified: Secondary | ICD-10-CM

## 2013-01-29 MED ORDER — CIPROFLOXACIN HCL 500 MG PO TABS: 500.0000 mg | ORAL_TABLET | Freq: Two times a day (BID) | ORAL | Status: DC

## 2013-01-29 MED ORDER — MECLIZINE HCL 25 MG PO TABS: 25.0000 mg | ORAL_TABLET | Freq: Three times a day (TID) | ORAL | Status: DC | PRN

## 2013-01-29 NOTE — Telephone Encounter (Signed)
Pt is calling about his prostate medication (rapid flow 8 mg capsule) he said he was under the impression that he was suppose to take  2 daily however the  prescription only says take one daily Call back number is [phone]

## 2013-01-29 NOTE — Telephone Encounter (Signed)
Patient aware.

## 2013-01-29 NOTE — Telephone Encounter (Signed)
Once a day

## 2013-01-29 NOTE — Telephone Encounter (Signed)
Rapaflow is just once a day correct???

## 2013-01-29 NOTE — Progress Notes (Signed)
Subjective:    Patient ID: Jeremy Terry, male    DOB: 1945-05-27, 67 y.o.   MRN: [id number]  HPI Patient is about to leave on a cruise to [place]. He is concerned about traveler's diarrhea. He has diarrhea on regular basis do to medications. All his vaccinations are up-to-date except Prevnar 13. He is also concerned about seasickness while on the cruise. Past Medical History  Diagnosis Date  . Coronary artery disease   . Hypertension   . Diabetes mellitus   . Hypercholesteremia   . BPH (benign prostatic hypertrophy)   . DJD (degenerative joint disease)   . Asthma   . Memory loss   . Ventral hernia   . GERD (gastroesophageal reflux disease)   . Allergic rhinitis   . Parkinsonian syndrome   . Hypogonadism male   . Neuropathy of leg     Bilateral  . Depression   . Parkinson disease    Current Outpatient Prescriptions on File Prior to Visit  Medication Sig Dispense Refill  . aspirin 81 MG tablet Take 81 mg by mouth daily.        Marland Kitchen atorvastatin (LIPITOR) 40 MG tablet TAKE ONE TABLET BY MOUTH DAILY  30 tablet  10  . Blood Glucose Monitoring Suppl (FREESTYLE LITE) DEVI As directed  1 each  0  . carbidopa-levodopa (SINEMET IR) 25-100 MG per tablet Take 1 tablet by mouth 4 (four) times daily.  120 tablet  5  . clopidogrel (PLAVIX) 75 MG tablet Take 1 tablet (75 mg total) by mouth daily.  30 tablet  12  . donepezil (ARICEPT) 10 MG tablet Take 1 tablet (10 mg total) by mouth at bedtime.  90 tablet  3  . fexofenadine (ALLEGRA) 60 MG tablet Take 60 mg by mouth daily.      Marland Kitchen glipiZIDE (GLUCOTROL) 5 MG tablet Take 1 tablet (5 mg total) by mouth 2 (two) times daily before a meal.  60 tablet  6  . glucose blood (FREESTYLE LITE) test strip Check blood sugar daily and prn - Dx:250.00  100 each  3  . hydrochlorothiazide (HYDRODIURIL) 25 MG tablet Take 1 tablet (25 mg total) by mouth daily.  30 tablet  5  . hydrOXYzine (ATARAX/VISTARIL) 25 MG tablet Take 25 mg by mouth daily.        . Lancets  (FREESTYLE) lancets Use as instructed  100 each  5  . losartan (COZAAR) 100 MG tablet Take 1 tablet (100 mg total) by mouth daily.  30 tablet  6  . meloxicam (MOBIC) 15 MG tablet Take 1 tablet (15 mg total) by mouth daily.  30 tablet  2  . metoprolol succinate (TOPROL-XL) 50 MG 24 hr tablet Take 1 tablet (50 mg total) by mouth daily.  30 tablet  6  . Multiple Vitamin (MULTIVITAMIN) capsule Take 1 capsule by mouth daily.        . nitroGLYCERIN (NITROSTAT) 0.4 MG SL tablet Place 1 tablet (0.4 mg total) under the tongue every 5 (five) minutes as needed.  25 tablet  11  . silodosin (RAPAFLO) 8 MG CAPS capsule Take 1 capsule (8 mg total) by mouth daily with breakfast. For prostate  30 capsule  6  . trihexyphenidyl (ARTANE) 2 MG tablet Take 1 tablet (2 mg total) by mouth 2 (two) times daily with a meal.  60 tablet  3  . [DISCONTINUED] rOPINIRole (REQUIP) 1 MG tablet Take 1 mg by mouth 3 (three) times daily.  No current facility-administered medications on file prior to visit.   Allergies  Allergen Reactions  . Hydrocodone   . Morphine   . Sulfa Antibiotics    History   Social History  . Marital Status: Married    Spouse Name: N/A    Number of Children: 2  . Years of Education: 14   Occupational History  . retired     Visual merchandiser   Social History Main Topics  . Smoking status: Former Smoker    Quit date: 02/26/1998  . Smokeless tobacco: Never Used  . Alcohol Use: Yes     Comment: Occ  . Drug Use: No  . Sexual Activity: Not on file   Other Topics Concern  . Not on file   Social History Narrative   Pt lives at home with his spouse.   Caffeine Use: Does not consume      Review of Systems  All other systems reviewed and are negative.       Objective:   Physical Exam  Vitals reviewed. Constitutional: He appears well-developed and well-nourished.  Cardiovascular: Normal rate, regular rhythm and normal heart sounds.   Pulmonary/Chest: Effort normal and breath sounds  normal. No respiratory distress. He has no wheezes. He has no rales.  Abdominal: Soft. Bowel sounds are normal. He exhibits no distension. There is no tenderness. There is no rebound.  Musculoskeletal: He exhibits no edema.          Assessment & Plan:  Diarrhea  again the patient Cipro 500 mg by mouth twice a day for 3 days. Also gave him a prescription for light. Given strict instructions not to take the medication unless he develops febrile diarrhea with hematochezia. Also recommend meclizine 25 mg every 8 hours as needed for vertigo which seasickness. Recommended he receive Prevnar 13 vaccine when he returns.

## 2013-02-08 ENCOUNTER — Other Ambulatory Visit: Payer: Self-pay | Admitting: Neurology

## 2013-02-09 ENCOUNTER — Ambulatory Visit: Payer: Medicare Other | Admitting: Occupational Therapy

## 2013-02-09 ENCOUNTER — Ambulatory Visit: Payer: Medicare Other | Admitting: Family Medicine

## 2013-02-09 ENCOUNTER — Encounter: Payer: Self-pay | Admitting: Family Medicine

## 2013-02-09 ENCOUNTER — Ambulatory Visit: Payer: Medicare Other | Admitting: Physical Therapy

## 2013-02-09 ENCOUNTER — Ambulatory Visit (INDEPENDENT_AMBULATORY_CARE_PROVIDER_SITE_OTHER): Payer: Medicare Other | Admitting: Family Medicine

## 2013-02-09 ENCOUNTER — Ambulatory Visit: Payer: Medicare Other

## 2013-02-09 VITALS — BP 122/80 | HR 78 | Temp 97.2°F | Resp 18 | Wt 223.0 lb

## 2013-02-09 DIAGNOSIS — N400 Enlarged prostate without lower urinary tract symptoms: Secondary | ICD-10-CM

## 2013-02-09 DIAGNOSIS — Z23 Encounter for immunization: Secondary | ICD-10-CM

## 2013-02-09 MED ORDER — FINASTERIDE 5 MG PO TABS: 5.0000 mg | ORAL_TABLET | Freq: Every day | ORAL | Status: DC

## 2013-02-09 NOTE — Telephone Encounter (Signed)
My last note indicates he is off of cymbalta.  Can you check with pt since this is RX refill for it?

## 2013-02-09 NOTE — Addendum Note (Signed)
Addended by: Elvina Mattes T on: 02/09/2013 12:45 PM   Modules accepted: Orders

## 2013-02-09 NOTE — Progress Notes (Signed)
Subjective:    Patient ID: Jeremy Terry, male    DOB: Jul 02, 1945, 67 y.o.   MRN: [id number]  HPI 01/29/13 Patient is about to leave on a cruise to [place]. He is concerned about traveler's diarrhea. He has diarrhea on regular basis do to medications. All his vaccinations are up-to-date except Prevnar 13. He is also concerned about seasickness while on the cruise.  At that time, my plan was: again the patient Cipro 500 mg by mouth twice a day for 3 days. Also gave him a prescription for light. Given strict instructions not to take the medication unless he develops febrile diarrhea with hematochezia. Also recommend meclizine 25 mg every 8 hours as needed for vertigo which seasickness. Recommended he receive Prevnar 13 vaccine when he returns.  02/09/13 Patient here for follow up.  He would like to proceed with Prevnar 13 shots a day. However he is concerned because he is having further episodes of nocturia every night. This is keeping him from sleeping. He also reports urgency and hesitancy and weak stream. He has known BPH with a 2+ prostate on exam. Rapaflo  8 mg by mouth daily is not helping.  He denies any symptoms of overactive bladder.  Past Medical History  Diagnosis Date  . Coronary artery disease   . Hypertension   . Diabetes mellitus   . Hypercholesteremia   . BPH (benign prostatic hypertrophy)   . DJD (degenerative joint disease)   . Asthma   . Memory loss   . Ventral hernia   . GERD (gastroesophageal reflux disease)   . Allergic rhinitis   . Parkinsonian syndrome   . Hypogonadism male   . Neuropathy of leg     Bilateral  . Depression   . Parkinson disease    Current Outpatient Prescriptions on File Prior to Visit  Medication Sig Dispense Refill  . aspirin 81 MG tablet Take 81 mg by mouth daily.        Marland Kitchen atorvastatin (LIPITOR) 40 MG tablet TAKE ONE TABLET BY MOUTH DAILY  30 tablet  10  . Blood Glucose Monitoring Suppl (FREESTYLE LITE) DEVI As directed  1 each  0  .  carbidopa-levodopa (SINEMET IR) 25-100 MG per tablet Take 1 tablet by mouth 4 (four) times daily.  120 tablet  5  . ciprofloxacin (CIPRO) 500 MG tablet Take 1 tablet (500 mg total) by mouth 2 (two) times daily.  12 tablet  0  . clopidogrel (PLAVIX) 75 MG tablet Take 1 tablet (75 mg total) by mouth daily.  30 tablet  12  . donepezil (ARICEPT) 10 MG tablet Take 1 tablet (10 mg total) by mouth at bedtime.  90 tablet  3  . fexofenadine (ALLEGRA) 60 MG tablet Take 60 mg by mouth daily.      Marland Kitchen glipiZIDE (GLUCOTROL) 5 MG tablet Take 1 tablet (5 mg total) by mouth 2 (two) times daily before a meal.  60 tablet  6  . glucose blood (FREESTYLE LITE) test strip Check blood sugar daily and prn - Dx:250.00  100 each  3  . hydrochlorothiazide (HYDRODIURIL) 25 MG tablet Take 1 tablet (25 mg total) by mouth daily.  30 tablet  5  . hydrOXYzine (ATARAX/VISTARIL) 25 MG tablet Take 25 mg by mouth daily.        . Lancets (FREESTYLE) lancets Use as instructed  100 each  5  . losartan (COZAAR) 100 MG tablet Take 1 tablet (100 mg total) by mouth daily.  30 tablet  6  . meclizine (ANTIVERT) 25 MG tablet Take 1 tablet (25 mg total) by mouth 3 (three) times daily as needed for nausea.  30 tablet  0  . meloxicam (MOBIC) 15 MG tablet Take 1 tablet (15 mg total) by mouth daily.  30 tablet  2  . metoprolol succinate (TOPROL-XL) 50 MG 24 hr tablet Take 1 tablet (50 mg total) by mouth daily.  30 tablet  6  . Multiple Vitamin (MULTIVITAMIN) capsule Take 1 capsule by mouth daily.        . nitroGLYCERIN (NITROSTAT) 0.4 MG SL tablet Place 1 tablet (0.4 mg total) under the tongue every 5 (five) minutes as needed.  25 tablet  11  . silodosin (RAPAFLO) 8 MG CAPS capsule Take 1 capsule (8 mg total) by mouth daily with breakfast. For prostate  30 capsule  6  . trihexyphenidyl (ARTANE) 2 MG tablet Take 1 tablet (2 mg total) by mouth 2 (two) times daily with a meal.  60 tablet  3  . [DISCONTINUED] rOPINIRole (REQUIP) 1 MG tablet Take 1 mg by  mouth 3 (three) times daily.        No current facility-administered medications on file prior to visit.   Allergies  Allergen Reactions  . Hydrocodone   . Morphine   . Sulfa Antibiotics    History   Social History  . Marital Status: Married    Spouse Name: N/A    Number of Children: 2  . Years of Education: 14   Occupational History  . retired     Visual merchandiser   Social History Main Topics  . Smoking status: Former Smoker    Quit date: 02/26/1998  . Smokeless tobacco: Never Used  . Alcohol Use: Yes     Comment: Occ  . Drug Use: No  . Sexual Activity: Not on file   Other Topics Concern  . Not on file   Social History Narrative   Pt lives at home with his spouse.   Caffeine Use: Does not consume      Review of Systems  All other systems reviewed and are negative.       Objective:   Physical Exam  Vitals reviewed. Constitutional: He appears well-developed and well-nourished.  Cardiovascular: Normal rate, regular rhythm and normal heart sounds.   Pulmonary/Chest: Effort normal and breath sounds normal. No respiratory distress. He has no wheezes. He has no rales.  Abdominal: Soft. Bowel sounds are normal. He exhibits no distension. There is no tenderness. There is no rebound.  Musculoskeletal: He exhibits no edema.          Assessment & Plan:  1. BPH (benign prostatic hyperplasia) Continue rapaflo.  Add finasteride 5 mg poqday to rapaflo.  Recheck DRE/PSA in 3 months.  If no benefit, consider stopping hydrochlorothiazide versus consult and urology for possible TURP - finasteride (PROSCAR) 5 MG tablet; Take 1 tablet (5 mg total) by mouth daily.  Dispense: 30 tablet; Refill: 3

## 2013-02-10 ENCOUNTER — Ambulatory Visit: Payer: Medicare Other | Attending: Neurology | Admitting: Speech Pathology

## 2013-02-10 DIAGNOSIS — R269 Unspecified abnormalities of gait and mobility: Secondary | ICD-10-CM | POA: Insufficient documentation

## 2013-02-10 DIAGNOSIS — G20A1 Parkinson's disease without dyskinesia, without mention of fluctuations: Secondary | ICD-10-CM | POA: Insufficient documentation

## 2013-02-10 DIAGNOSIS — G20 Parkinson's disease: Secondary | ICD-10-CM | POA: Insufficient documentation

## 2013-02-10 DIAGNOSIS — IMO0001 Reserved for inherently not codable concepts without codable children: Secondary | ICD-10-CM | POA: Insufficient documentation

## 2013-02-12 ENCOUNTER — Ambulatory Visit: Payer: Medicare Other

## 2013-02-16 ENCOUNTER — Other Ambulatory Visit: Payer: Self-pay | Admitting: Family Medicine

## 2013-02-17 ENCOUNTER — Ambulatory Visit: Payer: Medicare Other

## 2013-02-21 ENCOUNTER — Other Ambulatory Visit: Payer: Self-pay | Admitting: Neurology

## 2013-02-21 ENCOUNTER — Other Ambulatory Visit: Payer: Self-pay | Admitting: Family Medicine

## 2013-02-23 ENCOUNTER — Ambulatory Visit (INDEPENDENT_AMBULATORY_CARE_PROVIDER_SITE_OTHER): Payer: Medicare Other | Admitting: Neurology

## 2013-02-23 ENCOUNTER — Encounter: Payer: Self-pay | Admitting: Neurology

## 2013-02-23 VITALS — BP 162/80 | HR 68 | Temp 97.8°F | Resp 12 | Ht 69.0 in | Wt 222.2 lb

## 2013-02-23 DIAGNOSIS — G20A1 Parkinson's disease without dyskinesia, without mention of fluctuations: Secondary | ICD-10-CM

## 2013-02-23 DIAGNOSIS — E1142 Type 2 diabetes mellitus with diabetic polyneuropathy: Secondary | ICD-10-CM

## 2013-02-23 DIAGNOSIS — G20 Parkinson's disease: Secondary | ICD-10-CM

## 2013-02-23 DIAGNOSIS — E1149 Type 2 diabetes mellitus with other diabetic neurological complication: Secondary | ICD-10-CM

## 2013-02-23 MED ORDER — CARBIDOPA-LEVODOPA 25-100 MG PO TABS: 1.0000 | ORAL_TABLET | Freq: Four times a day (QID) | ORAL | Status: DC

## 2013-02-23 MED ORDER — TRIHEXYPHENIDYL HCL 2 MG PO TABS: 2.0000 mg | ORAL_TABLET | Freq: Two times a day (BID) | ORAL | Status: DC

## 2013-02-23 NOTE — Progress Notes (Signed)
Jeremy Terry was seen today in the movement disorders clinic for neurologic consultation at the request of [name] [name], MD.  He was previously seen by Dr. [name].  They're requesting a second opinion regarding possible DBS surgery.  He has an appointment with [facility] in this regard as well.  The pt began with L hand tremor that began in 07/2009.  He was started on requip in 06/2010, but after 2 years on the medication, it was tapered off just 2 days ago because of compulsive spending.  When he was initally placed on the medication he had some nausea but his wife did think that it was effective. He has been on levodopa since 09/2011.  This was definitely effective.  He takes 1 1/2 tablets three to four times per day.  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  His wife notices that the medication may wear off about 30 minutes to one hour before the next dosage, as she will notice a resurgence of tremor.  He takes medication at 5 AM, 12 PM, often misses the 3 PM dose and then was taken another at 6 PM.  10/03/12:   The patient presents today with his wife who supplements the history.  They report that he did not get started on Comtan.  His pharmacy told them that it was backordered.  However, when we called the pharmacy and they stated that they had not received the prescription.  The patient did have a fall on July 16 resulting in a wrist fracture.  He was outside and fell over a bucket when he tripped. 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  He is currently taking his carbidopa/levodopa 25/100, 1-1/2 tablets at 8 AM/12 PM/3 M/6 PM.  He cannot tell when the medication wears off.  His wife states that it definitely does help.  The patient did go to therapy.  It was recommended that he use a cane.  He has not been doing that.  His wife recalls that he has been very impulsive.  He  is having major personality change.  He curses a lot and he never did this previously.  He has no hallucinations.  He is having intermittent diplopia.  10/31/12 update:  The pt presents with his wife who supplements the hx.  His Aricept was increased last time to 10 mg daily.  His Requip was d/c.  Is not sure of the levodopa is helping but he continues to take carbidopa/levodopa 25/100, 1-1/2 tablets 4 times per day.  I did discontinue his Cymbalta last visit as he was using it for peripheral neuropathy, but had no symptoms of paresthesias.  He noticed no increase in paresthesias with the discontinuation of Cymbalta.  His mood remains good.  His wife states that his personality has been much better and he is not cursing like he was.  He continues to have some intermittent double vision.  He is exercising some, although not necessarily faithfully.  11/27/12 update:  This patient is accompanied in the office by his spouse who supplements the history.  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  He is currently on Aricept 10 mg daily.  He is on carbidopa/levodopa 25/100, 1-1/2 tablets 4 times per day.  Last visit, I cautiously added Artane, because of his biggest complaint of tremor.  He reports that tremor is markedly better.  He did hold his carbidopa/levodopa at my request prior to coming into the office today.  He really feels no different off of the medication.  His biggest complaint is significant knee pain.  It is preventing him from exercising.  I did review his MRI of the brain that was recently done.  There was ventricular prominence, but I thought that the frontotemporal atrophy was greater than the atrophy posteriorly.  12/29/12 update:  This patient is accompanied in the office by his spouse who supplements the history.  He is on artane and doing well.  Last visit, he did not think that the carbidopa/levodopa 25/100 was helpful and so we stopped it.  He got much  worse after this was stopped (falls, trouble getting out of the bed) and so he went to the PCP on Friday and it was restarted.    He is feeling much better.  His mood is better.  There are no hallucinations.  He does c/o hypophonic speech that is worsening.  02/23/13:  This patient is accompanied in the office by his spouse who supplements the history.  Last visit his carbidopa/levodopa was restarted as he got worse when he was off of the medication.  He takes it at 8 AM/12 PM/3 PM/6 PM.  He is doing well with this and much better now that he is back on the medication.  He is also on aricept 10 mg daily.  He still has spells where he has a significant personality change and is mean to his wife in public, but just as quickly it seems to go away.  They just returned from a cruise and he had a good time.    His only c/o is tremor in the L hand.  He is supposed to be on artane but they don't know what happened to that.  I called the pharmacy and it was last picked up in sept.  He was doing better when on it without significant cognitive change. He is in ST and doing well with that.  PREVIOUS MEDICATIONS: Requip (compulsive spending), Cymbalta  ALLERGIES:   Allergies  Allergen Reactions  . Hydrocodone   . Morphine   . Sulfa Antibiotics     CURRENT MEDICATIONS:  Current Outpatient Prescriptions on File Prior to Visit  Medication Sig Dispense Refill  . aspirin 81 MG tablet Take 81 mg by mouth daily.        Marland Kitchen atorvastatin (LIPITOR) 40 MG tablet TAKE ONE TABLET BY MOUTH DAILY  30 tablet  10  . Blood Glucose Monitoring Suppl (FREESTYLE LITE) DEVI As directed  1 each  0  . carbidopa-levodopa (SINEMET IR) 25-100 MG per tablet Take 1 tablet by mouth 4 (four) times daily.  120 tablet  5  . ciprofloxacin (CIPRO) 500 MG tablet Take 1 tablet (500 mg total) by mouth 2 (two) times daily.  12 tablet  0  . clopidogrel (PLAVIX) 75 MG tablet Take 1 tablet (75 mg total) by mouth daily.  30 tablet  12  . donepezil  (ARICEPT) 10 MG tablet Take 1 tablet (10 mg total) by mouth at bedtime.  90 tablet  3  . fexofenadine (ALLEGRA) 60 MG tablet Take 60 mg by mouth daily.      . finasteride (PROSCAR) 5 MG tablet Take 1 tablet (5 mg total) by mouth daily.  30 tablet  3  . glipiZIDE (GLUCOTROL) 5 MG tablet Take 1 tablet (5 mg total) by mouth 2 (  two) times daily before a meal.  60 tablet  6  . glucose blood (FREESTYLE LITE) test strip Check blood sugar daily and prn - Dx:250.00  100 each  3  . hydrochlorothiazide (HYDRODIURIL) 25 MG tablet Take 1 tablet (25 mg total) by mouth daily.  30 tablet  5  . hydrOXYzine (ATARAX/VISTARIL) 25 MG tablet Take 25 mg by mouth daily.        . Lancets (FREESTYLE) lancets Use as instructed  100 each  5  . losartan (COZAAR) 100 MG tablet Take 1 tablet (100 mg total) by mouth daily.  30 tablet  6  . meclizine (ANTIVERT) 25 MG tablet Take 1 tablet (25 mg total) by mouth 3 (three) times daily as needed for nausea.  30 tablet  0  . meloxicam (MOBIC) 15 MG tablet TAKE 1 TABLET (15 MG TOTAL) BY MOUTH DAILY.  30 tablet  2  . metoprolol succinate (TOPROL-XL) 50 MG 24 hr tablet Take 1 tablet (50 mg total) by mouth daily.  30 tablet  6  . Multiple Vitamin (MULTIVITAMIN) capsule Take 1 capsule by mouth daily.        . nitroGLYCERIN (NITROSTAT) 0.4 MG SL tablet Place 1 tablet (0.4 mg total) under the tongue every 5 (five) minutes as needed.  25 tablet  11  . silodosin (RAPAFLO) 8 MG CAPS capsule Take 1 capsule (8 mg total) by mouth daily with breakfast. For prostate  30 capsule  6  . trihexyphenidyl (ARTANE) 2 MG tablet Take 1 tablet (2 mg total) by mouth 2 (two) times daily with a meal.  60 tablet  3  . [DISCONTINUED] rOPINIRole (REQUIP) 1 MG tablet Take 1 mg by mouth 3 (three) times daily.        No current facility-administered medications on file prior to visit.    PAST MEDICAL HISTORY:   Past Medical History  Diagnosis Date  . Coronary artery disease   . Hypertension   . Diabetes  mellitus   . Hypercholesteremia   . BPH (benign prostatic hypertrophy)   . DJD (degenerative joint disease)   . Asthma   . Memory loss   . Ventral hernia   . GERD (gastroesophageal reflux disease)   . Allergic rhinitis   . Parkinsonian syndrome   . Hypogonadism male   . Neuropathy of leg     Bilateral  . Depression   . Parkinson disease     PAST SURGICAL HISTORY:   Past Surgical History  Procedure Laterality Date  . Coronary stent placement  2003  . Shoulder surgery  2011    rotator cuff    SOCIAL HISTORY:   History   Social History  . Marital Status: Married    Spouse Name: N/A    Number of Children: 2  . Years of Education: 14   Occupational History  . retired     Visual merchandiser   Social History Main Topics  . Smoking status: Former Smoker    Quit date: 02/26/1998  . Smokeless tobacco: Never Used  . Alcohol Use: Yes     Comment: Occ  . Drug Use: No  . Sexual Activity: Not on file   Other Topics Concern  . Not on file   Social History Narrative   Pt lives at home with his spouse.   Caffeine Use: Does not consume    FAMILY HISTORY:   Family Status  Relation Status Death Age  . Father Deceased 2    CHF  . Mother Deceased  pneumonia  . Child Alive     2, healthy    ROS:  A complete 10 system review of systems was obtained and was unremarkable apart from what is mentioned above.  PHYSICAL EXAMINATION:    VITALS:   There were no vitals filed for this visit. BP Readings from Last 3 Encounters:  02/09/13 122/80  01/29/13 128/76  01/09/13 138/80    GEN:  The patient appears stated age and is in NAD. HEENT:  Normocephalic, atraumatic.  The mucous membranes are moist. The superficial temporal arteries are without ropiness or tenderness. CV:  RRR Lungs:  CTAB Neck/HEME:  There are no carotid bruits bilaterally.  Neurological examination:  Orientation: The patient is alert and oriented x3.  A MoCA was performed and the pt scored 17/30 Cranial  nerves: There is good facial symmetry.  There is facial hypomimia.  Pupils are equal round and reactive to light bilaterally. Fundoscopic exam reveals clear margins bilaterally. Extraocular muscles are intact.  There are definite square wave jerks.  The visual fields are full to confrontational testing. The speech is fluent and clear.  At times, it becomes quite hypophonic and he has significant difficulty with the guttural sounds.  Soft palate rises symmetrically and there is no tongue deviation. Hearing is intact to conversational tone. Motor: Strength is 5/5 in the bilateral upper and lower extremities.   Shoulder shrug is equal and symmetric.  There is no pronator drift. Deep tendon reflexes: Deep tendon reflexes are 3/4 at the bilateral biceps, triceps, brachioradialis, patella and achilles. Plantar response is upgoing on the left and downgoing on the right.  Movement examination: Tone: There is normal tone in the bilateral upper extremities.  The tone in the lower extremities is normal.  Abnormal movements: There is a mild to moderate left upper extremity resting tremor. There is no tremor in the legs Coordination:  There is minimal decremation with hand opening and closing on the left, but finger taps were good bilaterally.  He has difficulty with heel taps and toe taps bilaterally. Gait and Station: The patient has no difficulty arising out of a deep-seated chair without the use of the hands. The patient's stride length is normal, but the left upper extremity resting tremor does increase with ambulation.  The patient has a negative pull test.      Labs: I got lab work from his primary care physician dated September 08, 2012; his sodium was 138, potassium 4.4, chloride 96, CO2 26, BUN 18 creatinine 1.00.  Hemoglobin A1c was 6.3.  White blood cells were 9.4, hemoglobin 16.3, hematocrit 46.7 and platelets 300.  ASSESSMENT/PLAN:  1.  Parkinsonism.  -He does have some atypical features that are  worrisome for one of the atypical parkinsonian states.  He is hyperreflexic, has an upgoing toe on the right, has square wave jerks, has diplopia and has had personality changes.  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  -He will continue on the aricept 10 mg daily  -He did not do well off of the levodopa and has resumed that at 8am/12pm/3pm/6pm with marked improvement in sx's  -He will restart the Artane as he did better with that.  We will monitor for cognitive changes.  -He will continue with his ST.  2.    Peripheral neuropathy, likely secondary to diabetes.  -He is off of Cymbalta and doing well. 3  I will followup with patient in  a few months, sooner should new neurologic issues arise.

## 2013-02-24 ENCOUNTER — Ambulatory Visit: Payer: Medicare Other | Admitting: Speech Pathology

## 2013-02-27 ENCOUNTER — Ambulatory Visit: Payer: Medicare Other | Attending: Diagnostic Neuroimaging

## 2013-02-27 DIAGNOSIS — Z5189 Encounter for other specified aftercare: Secondary | ICD-10-CM | POA: Insufficient documentation

## 2013-02-27 DIAGNOSIS — R471 Dysarthria and anarthria: Secondary | ICD-10-CM | POA: Insufficient documentation

## 2013-02-27 DIAGNOSIS — G20A1 Parkinson's disease without dyskinesia, without mention of fluctuations: Secondary | ICD-10-CM | POA: Insufficient documentation

## 2013-02-27 DIAGNOSIS — G20 Parkinson's disease: Secondary | ICD-10-CM | POA: Insufficient documentation

## 2013-02-27 DIAGNOSIS — R269 Unspecified abnormalities of gait and mobility: Secondary | ICD-10-CM | POA: Insufficient documentation

## 2013-02-28 ENCOUNTER — Other Ambulatory Visit: Payer: Self-pay | Admitting: Family Medicine

## 2013-03-03 NOTE — Telephone Encounter (Signed)
Refill denied.  Called patient, states he is better.

## 2013-03-05 ENCOUNTER — Ambulatory Visit: Payer: Medicare Other | Admitting: Physical Therapy

## 2013-03-05 ENCOUNTER — Ambulatory Visit: Payer: Medicare Other | Admitting: Occupational Therapy

## 2013-03-09 ENCOUNTER — Other Ambulatory Visit: Payer: Self-pay | Admitting: Neurology

## 2013-03-09 DIAGNOSIS — G20 Parkinson's disease: Secondary | ICD-10-CM

## 2013-04-06 ENCOUNTER — Telehealth: Payer: Self-pay | Admitting: Family Medicine

## 2013-04-06 NOTE — Telephone Encounter (Signed)
Message copied by Alyson Locket on Mon Apr 06, 2013  5:14 PM ------      Message from: Lenore Manner      Created: Mon Apr 06, 2013  2:22 PM      Regarding: Question about Blood Thinner      Contact: [phone]       Jeremy Terry is calling in regards to [name] blood thinner medication she is wanting to know how long before getting a tooth pulled does he need to be off of them  ------

## 2013-04-07 NOTE — Telephone Encounter (Signed)
Patients wife aware

## 2013-04-07 NOTE — Telephone Encounter (Signed)
yes

## 2013-04-07 NOTE — Telephone Encounter (Signed)
Does he need to stop Plavix as well?

## 2013-04-07 NOTE — Telephone Encounter (Signed)
Stop aspirin 1 week prior to dental work.

## 2013-04-25 ENCOUNTER — Emergency Department (HOSPITAL_COMMUNITY)
Admission: EM | Admit: 2013-04-25 | Discharge: 2013-04-25 | Disposition: A | Discharge status: Home / Self Care | Payer: Medicare Other | Attending: Emergency Medicine | Admitting: Emergency Medicine

## 2013-04-25 ENCOUNTER — Emergency Department (HOSPITAL_COMMUNITY): Payer: Medicare Other

## 2013-04-25 ENCOUNTER — Encounter (HOSPITAL_COMMUNITY): Payer: Self-pay | Admitting: Emergency Medicine

## 2013-04-25 DIAGNOSIS — G20A1 Parkinson's disease without dyskinesia, without mention of fluctuations: Secondary | ICD-10-CM | POA: Insufficient documentation

## 2013-04-25 DIAGNOSIS — Z792 Long term (current) use of antibiotics: Secondary | ICD-10-CM | POA: Insufficient documentation

## 2013-04-25 DIAGNOSIS — Z79899 Other long term (current) drug therapy: Secondary | ICD-10-CM | POA: Insufficient documentation

## 2013-04-25 DIAGNOSIS — Z8659 Personal history of other mental and behavioral disorders: Secondary | ICD-10-CM | POA: Insufficient documentation

## 2013-04-25 DIAGNOSIS — Z7982 Long term (current) use of aspirin: Secondary | ICD-10-CM | POA: Insufficient documentation

## 2013-04-25 DIAGNOSIS — Z791 Long term (current) use of non-steroidal anti-inflammatories (NSAID): Secondary | ICD-10-CM | POA: Insufficient documentation

## 2013-04-25 DIAGNOSIS — I10 Essential (primary) hypertension: Secondary | ICD-10-CM | POA: Insufficient documentation

## 2013-04-25 DIAGNOSIS — I2510 Atherosclerotic heart disease of native coronary artery without angina pectoris: Secondary | ICD-10-CM | POA: Insufficient documentation

## 2013-04-25 DIAGNOSIS — Z87891 Personal history of nicotine dependence: Secondary | ICD-10-CM | POA: Insufficient documentation

## 2013-04-25 DIAGNOSIS — K219 Gastro-esophageal reflux disease without esophagitis: Secondary | ICD-10-CM | POA: Insufficient documentation

## 2013-04-25 DIAGNOSIS — R42 Dizziness and giddiness: Secondary | ICD-10-CM | POA: Insufficient documentation

## 2013-04-25 DIAGNOSIS — E7800 Pure hypercholesterolemia, unspecified: Secondary | ICD-10-CM | POA: Insufficient documentation

## 2013-04-25 DIAGNOSIS — R0600 Dyspnea, unspecified: Secondary | ICD-10-CM

## 2013-04-25 DIAGNOSIS — N400 Enlarged prostate without lower urinary tract symptoms: Secondary | ICD-10-CM | POA: Insufficient documentation

## 2013-04-25 DIAGNOSIS — M1990 Unspecified osteoarthritis, unspecified site: Secondary | ICD-10-CM | POA: Insufficient documentation

## 2013-04-25 DIAGNOSIS — Z8719 Personal history of other diseases of the digestive system: Secondary | ICD-10-CM | POA: Insufficient documentation

## 2013-04-25 DIAGNOSIS — G20 Parkinson's disease: Secondary | ICD-10-CM | POA: Insufficient documentation

## 2013-04-25 DIAGNOSIS — E119 Type 2 diabetes mellitus without complications: Secondary | ICD-10-CM | POA: Insufficient documentation

## 2013-04-25 DIAGNOSIS — Z7902 Long term (current) use of antithrombotics/antiplatelets: Secondary | ICD-10-CM | POA: Insufficient documentation

## 2013-04-25 DIAGNOSIS — Z9861 Coronary angioplasty status: Secondary | ICD-10-CM | POA: Insufficient documentation

## 2013-04-25 DIAGNOSIS — J45901 Unspecified asthma with (acute) exacerbation: Secondary | ICD-10-CM | POA: Insufficient documentation

## 2013-04-25 LAB — COMPREHENSIVE METABOLIC PANEL
ALK PHOS: 59 U/L (ref 39–117)
ALT: 25 U/L (ref 0–53)
AST: 18 U/L (ref 0–37)
Albumin: 3.7 g/dL (ref 3.5–5.2)
BUN: 21 mg/dL (ref 6–23)
CALCIUM: 9.2 mg/dL (ref 8.4–10.5)
CO2: 27 mEq/L (ref 19–32)
Chloride: 98 mEq/L (ref 96–112)
Creatinine, Ser: 0.92 mg/dL (ref 0.50–1.35)
GFR calc Af Amer: >90 mL/min (ref >90)
GFR calc non Af Amer: 85 mL/min — ABNORMAL LOW (ref >90)
Glucose, Bld: 146 mg/dL — ABNORMAL HIGH (ref 70–99)
POTASSIUM: 3.4 meq/L — AB (ref 3.7–5.3)
Sodium: 136 mEq/L — ABNORMAL LOW (ref 137–147)
TOTAL PROTEIN: 7 g/dL (ref 6.0–8.3)
Total Bilirubin: 0.5 mg/dL (ref 0.3–1.2)

## 2013-04-25 LAB — CBC WITH DIFFERENTIAL/PLATELET
BASOS ABS: 0 10*3/uL (ref 0.0–0.1)
Basophils Relative: 1 % (ref 0–1)
EOS PCT: 3 % (ref 0–5)
Eosinophils Absolute: 0.2 10*3/uL (ref 0.0–0.7)
HCT: 41.7 % (ref 39.0–52.0)
Hemoglobin: 14.6 g/dL (ref 13.0–17.0)
LYMPHS PCT: 23 % (ref 12–46)
Lymphs Abs: 1.4 10*3/uL (ref 0.7–4.0)
MCH: 33 pg (ref 26.0–34.0)
MCHC: 35 g/dL (ref 30.0–36.0)
MCV: 94.1 fL (ref 78.0–100.0)
Monocytes Absolute: 0.8 10*3/uL (ref 0.1–1.0)
Monocytes Relative: 13 % — ABNORMAL HIGH (ref 3–12)
NEUTROS PCT: 62 % (ref 43–77)
Neutro Abs: 3.9 10*3/uL (ref 1.7–7.7)
Platelets: 242 10*3/uL (ref 150–400)
RBC: 4.43 MIL/uL (ref 4.22–5.81)
RDW: 13.2 % (ref 11.5–15.5)
WBC: 6.3 10*3/uL (ref 4.0–10.5)

## 2013-04-25 LAB — TROPONIN I: Troponin I: <0.3 ng/mL (ref <0.30)

## 2013-04-25 LAB — PRO B NATRIURETIC PEPTIDE: PRO B NATRI PEPTIDE: 10.4 pg/mL (ref 0–125)

## 2013-04-25 LAB — D-DIMER, QUANTITATIVE: D-Dimer, Quant: <0.27 ug/mL-FEU (ref 0.00–0.48)

## 2013-04-25 MED ORDER — ALBUTEROL SULFATE (2.5 MG/3ML) 0.083% IN NEBU
2.5000 mg | INHALATION_SOLUTION | Freq: Once | RESPIRATORY_TRACT | Status: AC
  Administered 2013-04-25: 2.5 mg via RESPIRATORY_TRACT
  Filled 2013-04-25: qty 3

## 2013-04-25 MED ORDER — IPRATROPIUM-ALBUTEROL 0.5-2.5 (3) MG/3ML IN SOLN
3.0000 mL | Freq: Once | RESPIRATORY_TRACT | Status: AC
  Administered 2013-04-25: 3 mL via RESPIRATORY_TRACT
  Filled 2013-04-25: qty 3

## 2013-04-25 MED ORDER — ALBUTEROL SULFATE HFA 108 (90 BASE) MCG/ACT IN AERS
2.0000 | INHALATION_SPRAY | RESPIRATORY_TRACT | Status: DC | PRN
  Administered 2013-04-25: 2 via RESPIRATORY_TRACT
  Filled 2013-04-25: qty 6.7

## 2013-04-25 NOTE — ED Notes (Signed)
Shortness of breath progressively worsening over a week.  Can't eat w/out getting SOB.  Denies CP, n/v/d.  Some lightheadedness.

## 2013-04-25 NOTE — ED Provider Notes (Signed)
CSN: [id number]     Arrival date & time 04/25/13  2028 History  This chart was scribed for Maudry Diego, MD by Zettie Pho, ED Scribe. This patient was seen in room APA06/APA06 and the patient's care was started at 9:04 PM.    Chief Complaint  Patient presents with  . Shortness of Breath   Patient is a 68 y.o. male presenting with shortness of breath. The history is provided by the patient. No language interpreter was used.  Shortness of Breath Severity:  Moderate Onset quality:  Gradual Timing:  Intermittent Progression:  Worsening Chronicity:  New Relieved by:  Nothing Ineffective treatments:  None tried Associated symptoms: no abdominal pain, no chest pain, no cough, no fever, no headaches and no rash   Risk factors: alcohol use    HPI Comments: Jeremy Terry is a 68 y.o. male with a history of CAD with coronary stent placement, HTN, hypercholesterolemia, asthma who presents to the Emergency Department complaining of intermittent shortness of breath onset about a week ago that he states has been progressively worsening. He states that this is new for him. Patient reports some associated, intermittent lightheadedness. He denies cough, fever. He reports allergies to hydrocodone, morphine, and sulfa antibiotics. Patient also has a history of DM and Parkinson Disease.   Past Medical History  Diagnosis Date  . Coronary artery disease   . Hypertension   . Diabetes mellitus   . Hypercholesteremia   . BPH (benign prostatic hypertrophy)   . DJD (degenerative joint disease)   . Asthma   . Memory loss   . Ventral hernia   . GERD (gastroesophageal reflux disease)   . Allergic rhinitis   . Parkinsonian syndrome   . Hypogonadism male   . Neuropathy of leg     Bilateral  . Depression   . Parkinson disease    Past Surgical History  Procedure Laterality Date  . Coronary stent placement  2003  . Shoulder surgery  2011    rotator cuff   Family History  Problem Relation Age of Onset   . Other Father     respiratory problems   History  Substance Use Topics  . Smoking status: Former Smoker    Quit date: 02/26/1998  . Smokeless tobacco: Never Used  . Alcohol Use: Yes     Comment: Occ    Review of Systems  Constitutional: Negative for fever, appetite change and fatigue.  HENT: Negative for congestion, ear discharge and sinus pressure.   Eyes: Negative for discharge.  Respiratory: Positive for shortness of breath. Negative for cough.   Cardiovascular: Negative for chest pain.  Gastrointestinal: Negative for abdominal pain and diarrhea.  Genitourinary: Negative for frequency and hematuria.  Musculoskeletal: Negative for back pain.  Skin: Negative for rash.  Neurological: Positive for light-headedness. Negative for seizures and headaches.  Psychiatric/Behavioral: Negative for hallucinations.      Allergies  Hydrocodone; Morphine; and Sulfa antibiotics  Home Medications   Current Outpatient Rx  Name  Route  Sig  Dispense  Refill  . aspirin 81 MG tablet   Oral   Take 81 mg by mouth daily.           Marland Kitchen atorvastatin (LIPITOR) 40 MG tablet      TAKE ONE TABLET BY MOUTH DAILY   30 tablet   10   . Blood Glucose Monitoring Suppl (FREESTYLE LITE) DEVI      As directed   1 each   0   . carbidopa-levodopa (  SINEMET IR) 25-100 MG per tablet   Oral   Take 1 tablet by mouth 4 (four) times daily.   120 tablet   5   . ciprofloxacin (CIPRO) 500 MG tablet   Oral   Take 1 tablet (500 mg total) by mouth 2 (two) times daily.   12 tablet   0   . clopidogrel (PLAVIX) 75 MG tablet   Oral   Take 1 tablet (75 mg total) by mouth daily.   30 tablet   12   . donepezil (ARICEPT) 10 MG tablet   Oral   Take 1 tablet (10 mg total) by mouth at bedtime.   90 tablet   3   . fexofenadine (ALLEGRA) 60 MG tablet   Oral   Take 60 mg by mouth daily.         . finasteride (PROSCAR) 5 MG tablet   Oral   Take 1 tablet (5 mg total) by mouth daily.   30 tablet    3   . glipiZIDE (GLUCOTROL) 5 MG tablet   Oral   Take 1 tablet (5 mg total) by mouth 2 (two) times daily before a meal.   60 tablet   6   . glucose blood (FREESTYLE LITE) test strip      Check blood sugar daily and prn - Dx:250.00   100 each   3   . hydrochlorothiazide (HYDRODIURIL) 25 MG tablet   Oral   Take 1 tablet (25 mg total) by mouth daily.   30 tablet   5   . hydrOXYzine (ATARAX/VISTARIL) 25 MG tablet   Oral   Take 25 mg by mouth daily.           . Lancets (FREESTYLE) lancets      Use as instructed   100 each   5   . losartan (COZAAR) 100 MG tablet   Oral   Take 1 tablet (100 mg total) by mouth daily.   30 tablet   6   . meclizine (ANTIVERT) 25 MG tablet   Oral   Take 1 tablet (25 mg total) by mouth 3 (three) times daily as needed for nausea.   30 tablet   0   . meloxicam (MOBIC) 15 MG tablet      TAKE 1 TABLET (15 MG TOTAL) BY MOUTH DAILY.   30 tablet   2   . meloxicam (MOBIC) 15 MG tablet      TAKE 1 TABLET (15 MG TOTAL) BY MOUTH DAILY.   30 tablet   2   . metoprolol succinate (TOPROL-XL) 50 MG 24 hr tablet   Oral   Take 1 tablet (50 mg total) by mouth daily.   30 tablet   6   . Multiple Vitamin (MULTIVITAMIN) capsule   Oral   Take 1 capsule by mouth daily.           . nitroGLYCERIN (NITROSTAT) 0.4 MG SL tablet   Sublingual   Place 1 tablet (0.4 mg total) under the tongue every 5 (five) minutes as needed.   25 tablet   11   . silodosin (RAPAFLO) 8 MG CAPS capsule   Oral   Take 1 capsule (8 mg total) by mouth daily with breakfast. For prostate   30 capsule   6   . trihexyphenidyl (ARTANE) 2 MG tablet   Oral   Take 1 tablet (2 mg total) by mouth 2 (two) times daily with a meal.   60 tablet   3  Triage Vitals: BP 167/75  Pulse 81  Temp(Src) 98.2 F (36.8 C) (Oral)  Resp 20  Ht 5\' 9"  (1.753 m)  Wt 225 lb (102.059 kg)  BMI 33.21 kg/m2  SpO2 99%  Physical Exam  Constitutional: He is oriented to person, place, and  time. He appears well-developed.  HENT:  Head: Normocephalic.  Eyes: Conjunctivae and EOM are normal. No scleral icterus.  Neck: Neck supple. No thyromegaly present.  Cardiovascular: Normal rate and regular rhythm.  Exam reveals no gallop and no friction rub.   No murmur heard. Pulmonary/Chest: No stridor. He has no wheezes. He has no rales. He exhibits no tenderness.  Abdominal: He exhibits no distension. There is no tenderness. There is no rebound.  Musculoskeletal: Normal range of motion. He exhibits no edema.  Lymphadenopathy:    He has no cervical adenopathy.  Neurological: He is oriented to person, place, and time. He exhibits normal muscle tone. Coordination normal.  Tremor in the left arm consistent with Parkinson's.   Skin: No rash noted. No erythema.  Psychiatric: He has a normal mood and affect. His behavior is normal.    ED Course  Procedures (including critical care time)  DIAGNOSTIC STUDIES: Oxygen Saturation is 99% on room air, normal by my interpretation.    COORDINATION OF CARE: 9:05 PM- Will order a chest x-ray and blood labs (CBC, CMP, D-dimer, troponin, and BNP). Will order an albuterol/Duoneb breathing treatment to manage symptoms. Discussed treatment plan with patient at bedside and patient verbalized agreement.   10:52 PM- Patient reports feeling much better after receiving the breathing treatment. Discussed that lab and imaging results were normal. Patient is stable for discharge. Discussed treatment plan with patient at bedside and patient verbalized agreement.    Labs Review Labs Reviewed  CBC WITH DIFFERENTIAL - Abnormal; Notable for the following:    Monocytes Relative 13 (*)    All other components within normal limits  COMPREHENSIVE METABOLIC PANEL - Abnormal; Notable for the following:    Sodium 136 (*)    Potassium 3.4 (*)    Glucose, Bld 146 (*)    GFR calc non Af Amer 85 (*)    All other components within normal limits  D-DIMER, QUANTITATIVE   TROPONIN I  PRO B NATRIURETIC PEPTIDE   Imaging Review Dg Chest 2 View  04/25/2013   CLINICAL DATA:  Shortness of breath and dizziness.  EXAM: CHEST  2 VIEW  COMPARISON:  Chest radiograph performed 03/02/2009  FINDINGS: The lungs are well-aerated. Minimal bibasilar atelectasis is noted. There is no evidence of pleural effusion or pneumothorax.  The heart is normal in size; the mediastinal contour is within normal limits. No acute osseous abnormalities are seen. There appears to be mild interval resorption of the distal right clavicle.  IMPRESSION: Minimal left basilar atelectasis noted; lungs otherwise clear.   Electronically Signed   By: Garald Balding M.D.   On: 04/25/2013 22:37     EKG Interpretation   Date/Time:  Saturday April 25 2013 21:37:51 EST Ventricular Rate:  71 PR Interval:  160 QRS Duration: 92 QT Interval:  418 QTC Calculation: 454 R Axis:   27 Text Interpretation:  Normal sinus rhythm Normal ECG No previous ECGs  available Confirmed by Lorianna Spadaccini  MD, Jeaneane Adamec ([id number]) on 04/25/2013 10:56:07 PM      MDM  Bronchospasm.  Pt improved with tx and will see pcp this week. The chart was scribed for me under my direct supervision.  I personally performed the history, physical,  and medical decision making and all procedures in the evaluation of this patient.Maudry Diego, MD 04/25/13 2256

## 2013-04-25 NOTE — Discharge Instructions (Signed)
Follow up with your md next week. °

## 2013-04-25 NOTE — ED Notes (Signed)
Family at bedside. Patient place on heart monitor

## 2013-05-01 ENCOUNTER — Encounter: Payer: Self-pay | Admitting: Family Medicine

## 2013-05-01 ENCOUNTER — Ambulatory Visit (INDEPENDENT_AMBULATORY_CARE_PROVIDER_SITE_OTHER): Payer: Medicare Other | Admitting: Family Medicine

## 2013-05-01 VITALS — BP 138/72 | HR 78 | Temp 98.6°F | Resp 20 | Ht 69.0 in | Wt 229.0 lb

## 2013-05-01 DIAGNOSIS — J019 Acute sinusitis, unspecified: Secondary | ICD-10-CM

## 2013-05-01 MED ORDER — AMOXICILLIN-POT CLAVULANATE 875-125 MG PO TABS: 1.0000 | ORAL_TABLET | Freq: Two times a day (BID) | ORAL | Status: DC

## 2013-05-01 MED ORDER — FLUTICASONE PROPIONATE 50 MCG/ACT NA SUSP: 2.0000 | Freq: Every day | NASAL | Status: DC

## 2013-05-01 MED ORDER — ALBUTEROL SULFATE HFA 108 (90 BASE) MCG/ACT IN AERS: 2.0000 | INHALATION_SPRAY | Freq: Four times a day (QID) | RESPIRATORY_TRACT | Status: AC | PRN

## 2013-05-01 NOTE — Progress Notes (Signed)
Subjective:    Patient ID: Jeremy Terry, male    DOB: 1945/08/19, 68 y.o.   MRN: [id number]  HPI Sunday the patient was seen in the emergency room and diagnosed with bronchospasm. He's been having a sinus problem for the last week. Is characterized by nasal congestion, rhinorrhea. The patient states he cannot breathe through his nose. He has a lot of sinus pressure and sinus pain. He feels lightheaded congestion. He reports a significant amount postnasal drip. He was given albuterol inhalers in the emergency room. He has been using that every 4-6 hours. He states that it does temporarily help with his breathing he believes the majority of his problem is located in the sinuses. Past Medical History  Diagnosis Date  . Coronary artery disease   . Hypertension   . Diabetes mellitus   . Hypercholesteremia   . BPH (benign prostatic hypertrophy)   . DJD (degenerative joint disease)   . Asthma   . Memory loss   . Ventral hernia   . GERD (gastroesophageal reflux disease)   . Allergic rhinitis   . Parkinsonian syndrome   . Hypogonadism male   . Neuropathy of leg     Bilateral  . Depression   . Parkinson disease    Current Outpatient Prescriptions on File Prior to Visit  Medication Sig Dispense Refill  . aspirin EC 81 MG tablet Take 81 mg by mouth daily.      Marland Kitchen atorvastatin (LIPITOR) 40 MG tablet TAKE ONE TABLET BY MOUTH DAILY  30 tablet  10  . carbidopa-levodopa (SINEMET IR) 25-100 MG per tablet Take 1 tablet by mouth 4 (four) times daily.  120 tablet  5  . clopidogrel (PLAVIX) 75 MG tablet Take 1 tablet (75 mg total) by mouth daily.  30 tablet  12  . donepezil (ARICEPT) 10 MG tablet Take 1 tablet (10 mg total) by mouth at bedtime.  90 tablet  3  . finasteride (PROSCAR) 5 MG tablet Take 1 tablet (5 mg total) by mouth daily.  30 tablet  3  . losartan (COZAAR) 100 MG tablet Take 1 tablet (100 mg total) by mouth daily.  30 tablet  6  . meclizine (ANTIVERT) 25 MG tablet Take 1 tablet (25 mg  total) by mouth 3 (three) times daily as needed for nausea.  30 tablet  0  . meloxicam (MOBIC) 15 MG tablet TAKE 1 TABLET (15 MG TOTAL) BY MOUTH DAILY.  30 tablet  2  . metoprolol succinate (TOPROL-XL) 50 MG 24 hr tablet Take 1 tablet (50 mg total) by mouth daily.  30 tablet  6  . Multiple Vitamin (MULTIVITAMIN) capsule Take 1 capsule by mouth daily.        . nitroGLYCERIN (NITROSTAT) 0.4 MG SL tablet Place 1 tablet (0.4 mg total) under the tongue every 5 (five) minutes as needed.  25 tablet  11  . silodosin (RAPAFLO) 8 MG CAPS capsule Take 1 capsule (8 mg total) by mouth daily with breakfast. For prostate  30 capsule  6  . [DISCONTINUED] rOPINIRole (REQUIP) 1 MG tablet Take 1 mg by mouth 3 (three) times daily.        No current facility-administered medications on file prior to visit.   Allergies  Allergen Reactions  . Hydrocodone   . Morphine   . Sulfa Antibiotics    History   Social History  . Marital Status: Married    Spouse Name: N/A    Number of Children: 2  . Years  of Education: 14   Occupational History  . retired     Psychologist, sport and exercise   Social History Main Topics  . Smoking status: Former Smoker    Quit date: 02/26/1998  . Smokeless tobacco: Never Used  . Alcohol Use: Yes     Comment: Occ  . Drug Use: No  . Sexual Activity: Not on file   Other Topics Concern  . Not on file   Social History Narrative   Pt lives at home with his spouse.   Caffeine Use: Does not consume      Review of Systems  All other systems reviewed and are negative.       Objective:   Physical Exam  Vitals reviewed. Constitutional: He appears well-developed and well-nourished. No distress.  HENT:  Head: Normocephalic and atraumatic.  Right Ear: Tympanic membrane, external ear and ear canal normal.  Left Ear: Tympanic membrane, external ear and ear canal normal.  Nose: Mucosal edema and rhinorrhea present. Right sinus exhibits maxillary sinus tenderness. Left sinus exhibits maxillary sinus  tenderness.  Mouth/Throat: Oropharynx is clear and moist. No oropharyngeal exudate.  Eyes: Conjunctivae are normal. No scleral icterus.  Neck: Neck supple.  Cardiovascular: Normal rate, regular rhythm and normal heart sounds.   No murmur heard. Pulmonary/Chest: Effort normal and breath sounds normal. No respiratory distress. He has no wheezes. He has no rales.  Abdominal: Soft. Bowel sounds are normal. He exhibits no distension. There is no tenderness. There is no rebound and no guarding.  Lymphadenopathy:    He has no cervical adenopathy.  Skin: He is not diaphoretic.          Assessment & Plan:  1. Acute rhinosinusitis She is not wheezing today on examl. I do not believe his present nasal congestion is due to his asthma. He can still use albuterol as needed for bronchospasm. However I think we need to focus on his sinuses to help improve his nasal congestion. He is to start Augmentin 875 mg by mouth twice a day for 10 days. Also and his Flonase 2 sprays each nostril daily. Also recommended Afrin one squirt each nostril twice a day for 3 days. If the patient continues to need albuterol multiple times each day for bronchospasm, start him on a daily preventative medicine such as Advair. - amoxicillin-clavulanate (AUGMENTIN) 875-125 MG per tablet; Take 1 tablet by mouth 2 (two) times daily.  Dispense: 20 tablet; Refill: 0 - fluticasone (FLONASE) 50 MCG/ACT nasal spray; Place 2 sprays into both nostrils daily.  Dispense: 16 g; Refill: 6 - albuterol (PROVENTIL HFA;VENTOLIN HFA) 108 (90 BASE) MCG/ACT inhaler; Inhale 2 puffs into the lungs every 6 (six) hours as needed for wheezing or shortness of breath.  Dispense: 1 Inhaler; Refill: 0

## 2013-05-13 ENCOUNTER — Other Ambulatory Visit: Payer: Self-pay | Admitting: Family Medicine

## 2013-05-25 ENCOUNTER — Encounter: Payer: Self-pay | Admitting: Neurology

## 2013-05-25 ENCOUNTER — Ambulatory Visit (INDEPENDENT_AMBULATORY_CARE_PROVIDER_SITE_OTHER): Payer: Medicare Other | Admitting: Neurology

## 2013-05-25 VITALS — BP 130/70 | HR 72 | Resp 18 | Ht 69.0 in | Wt 227.4 lb

## 2013-05-25 DIAGNOSIS — R413 Other amnesia: Secondary | ICD-10-CM

## 2013-05-25 DIAGNOSIS — G20 Parkinson's disease: Secondary | ICD-10-CM

## 2013-05-25 DIAGNOSIS — F329 Major depressive disorder, single episode, unspecified: Secondary | ICD-10-CM

## 2013-05-25 DIAGNOSIS — G20A1 Parkinson's disease without dyskinesia, without mention of fluctuations: Secondary | ICD-10-CM

## 2013-05-25 DIAGNOSIS — F3289 Other specified depressive episodes: Secondary | ICD-10-CM

## 2013-05-25 MED ORDER — CARBIDOPA-LEVODOPA 25-100 MG PO TABS: 1.0000 | ORAL_TABLET | Freq: Four times a day (QID) | ORAL | Status: DC

## 2013-05-25 MED ORDER — ESCITALOPRAM OXALATE 10 MG PO TABS: 10.0000 mg | ORAL_TABLET | Freq: Every day | ORAL | Status: DC

## 2013-05-25 NOTE — Progress Notes (Signed)
Jeremy Terry was seen today in the movement disorders clinic for neurologic consultation at the request of [name], MD.  He was previously seen by Dr. [name].  They're requesting a second opinion regarding possible DBS surgery.  He has an appointment with [facility] in this regard as well.  The pt began with L hand tremor that began in 07/2009.  He was started on requip in 06/2010, but after 2 years on the medication, it was tapered off just 2 days ago because of compulsive spending.  When he was initally placed on the medication he had some nausea but his wife did think that it was effective. He has been on levodopa since 09/2011.  This was definitely effective.  He takes 1 1/2 tablets three to four times per day.  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  His wife notices that the medication may wear off about 30 minutes to one hour before the next dosage, as she will notice a resurgence of tremor.  He takes medication at 5 AM, 12 PM, often misses the 3 PM dose and then was taken another at 6 PM.  10/03/12:   The patient presents today with his wife who supplements the history.  They report that he did not get started on Comtan.  His pharmacy told them that it was backordered.  However, when we called the pharmacy and they stated that they had not received the prescription.  The patient did have a fall on July 16 resulting in a wrist fracture.  He was outside and fell over a bucket when he tripped. 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  He is currently taking his carbidopa/levodopa 25/100, 1-1/2 tablets at 8 AM/12 PM/3 M/6 PM.  He cannot tell when the medication wears off.  His wife states that it definitely does help.  The patient did go to therapy.  It was recommended that he use a cane.  He has not been doing that.  His wife recalls that he has been very impulsive.  He  is having major personality change.  He curses a lot and he never did this previously.  He has no hallucinations.  He is having intermittent diplopia.  10/31/12 update:  The pt presents with his wife who supplements the hx.  His Aricept was increased last time to 10 mg daily.  His Requip was d/c.  Is not sure of the levodopa is helping but he continues to take carbidopa/levodopa 25/100, 1-1/2 tablets 4 times per day.  I did discontinue his Cymbalta last visit as he was using it for peripheral neuropathy, but had no symptoms of paresthesias.  He noticed no increase in paresthesias with the discontinuation of Cymbalta.  His mood remains good.  His wife states that his personality has been much better and he is not cursing like he was.  He continues to have some intermittent double vision.  He is exercising some, although not necessarily faithfully.  11/27/12 update:  This patient is accompanied in the office by his spouse who supplements the history.  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  He is currently on Aricept 10 mg daily.  He is on carbidopa/levodopa 25/100, 1-1/2 tablets 4 times per day.  Last visit, I cautiously added Artane, because of his biggest complaint of tremor.  He reports that tremor is markedly better.  He did hold his carbidopa/levodopa at my request prior to coming into the office today.  He really feels no different off of the medication.  His biggest complaint is significant knee pain.  It is preventing him from exercising.  I did review his MRI of the brain that was recently done.  There was ventricular prominence, but I thought that the frontotemporal atrophy was greater than the atrophy posteriorly.  12/29/12 update:  This patient is accompanied in the office by his spouse who supplements the history.  He is on artane and doing well.  Last visit, he did not think that the carbidopa/levodopa 25/100 was helpful and so we stopped it.  He got much  worse after this was stopped (falls, trouble getting out of the bed) and so he went to the PCP on Friday and it was restarted.    He is feeling much better.  His mood is better.  There are no hallucinations.  He does c/o hypophonic speech that is worsening.  02/23/13:  This patient is accompanied in the office by his spouse who supplements the history.  Last visit his carbidopa/levodopa was restarted as he got worse when he was off of the medication.  He takes it at 35 AM/12 PM/3 PM/6 PM.  He is doing well with this and much better now that he is back on the medication.  He is also on aricept 10 mg daily.  He still has spells where he has a significant personality change and is mean to his wife in public, but just as quickly it seems to go away.  They just returned from a cruise and he had a good time.    His only c/o is tremor in the L hand.  He is supposed to be on artane but they don't know what happened to that.  I called the pharmacy and it was last picked up in [place].  He was doing better when on it without significant cognitive change. He is in [place] and doing well with that.  05/25/13 update:  He is accompanied by his wife who supplements the history.  Pt is on levodopa at 8am/12pm/3pm/6pm.  I restarted his artane as he really wanted to retry.  I cautioned them about memory but his wife noted deterioration of memory when he went back on it and she stopped it in Jan.  He has been more moody and depressed.  He has had more balance problems.  He fell on Saturday when he bent over to pick up the dog food dish.  He was supposed to go back to OT but he got a bad sinus infection and then he just got worse and was unmotivated to go back.  If he gets up in the middle of the night, he will sometimes be confused as to how to get the bathroom.  No hallucinations.    PREVIOUS MEDICATIONS: Requip (compulsive spending), Cymbalta  ALLERGIES:   Allergies  Allergen Reactions  . Hydrocodone   . Morphine   . Sulfa  Antibiotics     CURRENT MEDICATIONS:  Current Outpatient Prescriptions on File Prior to Visit  Medication Sig Dispense Refill  . albuterol (PROVENTIL HFA;VENTOLIN HFA) 108 (90 BASE) MCG/ACT inhaler Inhale 2 puffs into the lungs every 6 (six) hours as needed for wheezing or shortness of breath.  1 Inhaler  0  . Albuterol Sulfate (VENTOLIN HFA IN) Inhale into the lungs. Inhale 2 puffs q 6 hours prn  wheezing      . aspirin EC 81 MG tablet Take 81 mg by mouth daily.      Marland Kitchen atorvastatin (LIPITOR) 40 MG tablet TAKE ONE TABLET BY MOUTH DAILY  30 tablet  10  . carbidopa-levodopa (SINEMET IR) 25-100 MG per tablet Take 1 tablet by mouth 4 (four) times daily.  120 tablet  5  . clopidogrel (PLAVIX) 75 MG tablet Take 1 tablet (75 mg total) by mouth daily.  30 tablet  12  . donepezil (ARICEPT) 10 MG tablet Take 1 tablet (10 mg total) by mouth at bedtime.  90 tablet  3  . finasteride (PROSCAR) 5 MG tablet Take 1 tablet (5 mg total) by mouth daily.  30 tablet  3  . fluticasone (FLONASE) 50 MCG/ACT nasal spray Place 2 sprays into both nostrils daily.  16 g  6  . losartan (COZAAR) 100 MG tablet Take 1 tablet (100 mg total) by mouth daily.  30 tablet  6  . meloxicam (MOBIC) 15 MG tablet TAKE 1 TABLET (15 MG TOTAL) BY MOUTH DAILY.  30 tablet  2  . metoprolol succinate (TOPROL-XL) 50 MG 24 hr tablet Take 1 tablet (50 mg total) by mouth daily.  30 tablet  6  . Multiple Vitamin (MULTIVITAMIN) capsule Take 1 capsule by mouth daily.        . nitroGLYCERIN (NITROSTAT) 0.4 MG SL tablet Place 1 tablet (0.4 mg total) under the tongue every 5 (five) minutes as needed.  25 tablet  11  . silodosin (RAPAFLO) 8 MG CAPS capsule Take 1 capsule (8 mg total) by mouth daily with breakfast. For prostate  30 capsule  6  . [DISCONTINUED] rOPINIRole (REQUIP) 1 MG tablet Take 1 mg by mouth 3 (three) times daily.        No current facility-administered medications on file prior to visit.    PAST MEDICAL HISTORY:   Past Medical  History  Diagnosis Date  . Coronary artery disease   . Hypertension   . Diabetes mellitus   . Hypercholesteremia   . BPH (benign prostatic hypertrophy)   . DJD (degenerative joint disease)   . Asthma   . Memory loss   . Ventral hernia   . GERD (gastroesophageal reflux disease)   . Allergic rhinitis   . Parkinsonian syndrome   . Hypogonadism male   . Neuropathy of leg     Bilateral  . Depression   . Parkinson disease     PAST SURGICAL HISTORY:   Past Surgical History  Procedure Laterality Date  . Coronary stent placement  2003  . Shoulder surgery  2011    rotator cuff    SOCIAL HISTORY:   History   Social History  . Marital Status: Married    Spouse Name: N/A    Number of Children: 2  . Years of Education: 14   Occupational History  . retired     Psychologist, sport and exercise   Social History Main Topics  . Smoking status: Former Smoker    Quit date: 02/26/1998  . Smokeless tobacco: Never Used  . Alcohol Use: Yes     Comment: Occ  . Drug Use: No  . Sexual Activity: Yes    Partners: Female   Other Topics Concern  . Not on file   Social History Narrative   Pt lives at home with his spouse.   Caffeine Use: Does not consume    FAMILY HISTORY:   Family Status  Relation Status Death Age  . Father  Deceased 51    CHF  . Mother Deceased     pneumonia  . Child Alive     2, healthy    ROS:  A complete 10 system review of systems was obtained and was unremarkable apart from what is mentioned above.  PHYSICAL EXAMINATION:    VITALS:   Filed Vitals:   05/25/13 1308  BP: 130/70  Pulse: 72  Resp: 18  Height: 5\' 9"  (1.753 m)  Weight: 227 lb 6.4 oz (103.148 kg)   BP Readings from Last 3 Encounters:  05/25/13 130/70  05/01/13 138/72  04/25/13 167/75    GEN:  The patient appears stated age and is in NAD. HEENT:  Normocephalic, atraumatic.  The mucous membranes are moist. The superficial temporal arteries are without ropiness or tenderness. CV:  RRR Lungs:   CTAB Neck/HEME:  There are no carotid bruits bilaterally.  Neurological examination:  Orientation: The patient is alert and oriented x3.  A MoCA was performed in 01/2013 and the pt scored 17/30.   Relies on wife for hx.   Cranial nerves: There is good facial symmetry.  There is facial hypomimia.  Pupils are equal round and reactive to light bilaterally. Fundoscopic exam reveals clear margins bilaterally. Extraocular muscles are intact with the exception of some upgaze paresis.  There are definite square wave jerks.  The visual fields are full to confrontational testing. The speech is fluent and clear.  At times, it becomes quite hypophonic and he has significant difficulty with the guttural sounds.  Soft palate rises symmetrically and there is no tongue deviation. Hearing is intact to conversational tone. Motor: Strength is 5/5 in the bilateral upper and lower extremities.   Shoulder shrug is equal and symmetric.  There is no pronator drift. Deep tendon reflexes: Deep tendon reflexes are 3/4 at the bilateral biceps, triceps, brachioradialis, patella and achilles. Plantar response is upgoing on the left and downgoing on the right.  Movement examination: Tone: There is normal tone in the bilateral upper extremities.  The tone in the lower extremities is normal.  Abnormal movements: There is a mild to moderate left upper extremity resting tremor. There is no tremor in the legs Coordination:  There is minimal decremation with hand opening and closing on the left, but finger taps were good bilaterally.  He has difficulty with heel taps and toe taps bilaterally. Gait and Station: The patient has no difficulty arising out of a deep-seated chair without the use of the hands. The patient's stride length is slightly decreased and more stooped posture today.    Labs: I got lab work from his primary care physician dated September 08, 2012; his sodium was 138, potassium 4.4, chloride 96, CO2 26, BUN 18 creatinine  1.00.  Hemoglobin A1c was 6.3.  White blood cells were 9.4, hemoglobin 16.3, hematocrit 46.7 and platelets 300.  ASSESSMENT/PLAN:  1.  Parkinsonism.  -He does have some atypical features that are worrisome for one of the atypical parkinsonian states.  He is hyperreflexic, has an upgoing toe on the right, has square wave jerks, has diplopia and has had personality changes.  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  -He will continue on the aricept 10 mg daily.  Re-do MoCA next visit.    -He did not do well off of the levodopa and has resumed that at 8am/12pm/3pm/6pm with marked improvement in sx's  -He will continue off the artane  as that made him worse  -gait has been worse and I will send back to PT/OT 2.    Peripheral neuropathy, likely secondary to diabetes.  -He is off of Cymbalta and doing well. 3.  Depression  -Start Lexapro - 10 mg daily.  Risks, benefits, side effects and alternative therapies were discussed.  The opportunity to ask questions was given and they were answered to the best of my ability.  The patient expressed understanding and willingness to follow the outlined treatment protocols. 4.  Return in about 8 weeks (around 07/20/2013).

## 2013-05-25 NOTE — Patient Instructions (Addendum)
1. [facility] have been referred [facility]. They will call you directly to schedule an appointment.  Please call [phone] if you do not hear from them.  2. Start Lexapro 3. Follow up 8 weeks.

## 2013-05-27 ENCOUNTER — Other Ambulatory Visit: Payer: Self-pay | Admitting: Family Medicine

## 2013-06-05 ENCOUNTER — Telehealth: Payer: Self-pay | Admitting: Neurology

## 2013-06-05 MED ORDER — CARBIDOPA-LEVODOPA 25-100 MG PO TABS: 1.5000 | ORAL_TABLET | Freq: Four times a day (QID) | ORAL | Status: DC

## 2013-06-05 NOTE — Telephone Encounter (Signed)
Pt's wife called, she wants to confirm correct does of Levadopa. Please call back at [phone] / Sherri S.

## 2013-06-05 NOTE — Telephone Encounter (Signed)
Patient's wife called and states last Carbidopa Levodopa RX was written for one tablet 4 times daily, but patient has been taking it one and a half tablets 4 times daily since seeing Dr Tat. He states doing well on the 1.5 dose. New RX sent to patient's pharmacy.

## 2013-06-08 ENCOUNTER — Encounter: Payer: Self-pay | Admitting: Cardiology

## 2013-06-10 ENCOUNTER — Other Ambulatory Visit: Payer: Self-pay | Admitting: Neurology

## 2013-06-11 ENCOUNTER — Other Ambulatory Visit: Payer: Self-pay

## 2013-06-11 MED ORDER — ATORVASTATIN CALCIUM 40 MG PO TABS: ORAL_TABLET | ORAL | Status: DC

## 2013-07-01 ENCOUNTER — Ambulatory Visit: Payer: Medicare Other | Attending: Neurology | Admitting: Physical Therapy

## 2013-07-01 ENCOUNTER — Ambulatory Visit: Payer: Medicare Other | Admitting: Occupational Therapy

## 2013-07-01 DIAGNOSIS — R471 Dysarthria and anarthria: Secondary | ICD-10-CM | POA: Diagnosis not present

## 2013-07-01 DIAGNOSIS — R269 Unspecified abnormalities of gait and mobility: Secondary | ICD-10-CM | POA: Diagnosis not present

## 2013-07-01 DIAGNOSIS — G20A1 Parkinson's disease without dyskinesia, without mention of fluctuations: Secondary | ICD-10-CM | POA: Insufficient documentation

## 2013-07-01 DIAGNOSIS — G20 Parkinson's disease: Secondary | ICD-10-CM | POA: Insufficient documentation

## 2013-07-01 DIAGNOSIS — IMO0001 Reserved for inherently not codable concepts without codable children: Secondary | ICD-10-CM | POA: Diagnosis present

## 2013-07-01 DIAGNOSIS — Z5189 Encounter for other specified aftercare: Secondary | ICD-10-CM | POA: Insufficient documentation

## 2013-07-07 ENCOUNTER — Ambulatory Visit: Payer: Medicare Other | Admitting: Occupational Therapy

## 2013-07-07 ENCOUNTER — Ambulatory Visit: Payer: Medicare Other | Admitting: Physical Therapy

## 2013-07-07 DIAGNOSIS — Z5189 Encounter for other specified aftercare: Secondary | ICD-10-CM | POA: Diagnosis not present

## 2013-07-09 ENCOUNTER — Other Ambulatory Visit: Payer: Self-pay | Admitting: Family Medicine

## 2013-07-13 ENCOUNTER — Telehealth: Payer: Self-pay | Admitting: *Deleted

## 2013-07-13 ENCOUNTER — Ambulatory Visit: Payer: Medicare Other | Admitting: Occupational Therapy

## 2013-07-13 ENCOUNTER — Ambulatory Visit: Payer: Medicare Other | Admitting: Physical Therapy

## 2013-07-13 DIAGNOSIS — Z5189 Encounter for other specified aftercare: Secondary | ICD-10-CM | POA: Diagnosis not present

## 2013-07-13 MED ORDER — LANCETS 28G MISC: 1.0000 | Freq: Every day | Status: DC

## 2013-07-13 MED ORDER — GLUCOSE BLOOD VI STRP: ORAL_STRIP | Status: DC

## 2013-07-13 MED ORDER — FREESTYLE SYSTEM KIT: 1.0000 | PACK | Status: DC | PRN

## 2013-07-13 NOTE — Telephone Encounter (Signed)
Pt called stating pt has lost his monitor to check his BS and needed another one along with test strips and lancets, scripts sent to pt pharmacy

## 2013-07-17 ENCOUNTER — Ambulatory Visit: Payer: Medicare Other | Admitting: Physical Therapy

## 2013-07-17 ENCOUNTER — Ambulatory Visit: Payer: Medicare Other | Admitting: Occupational Therapy

## 2013-07-17 DIAGNOSIS — Z5189 Encounter for other specified aftercare: Secondary | ICD-10-CM | POA: Diagnosis not present

## 2013-07-21 ENCOUNTER — Encounter: Payer: Self-pay | Admitting: Neurology

## 2013-07-21 ENCOUNTER — Ambulatory Visit (INDEPENDENT_AMBULATORY_CARE_PROVIDER_SITE_OTHER): Payer: Medicare Other | Admitting: Neurology

## 2013-07-21 ENCOUNTER — Telehealth: Payer: Self-pay | Admitting: Neurology

## 2013-07-21 VITALS — BP 144/78 | HR 74 | Ht 69.0 in | Wt 229.0 lb

## 2013-07-21 DIAGNOSIS — G20 Parkinson's disease: Secondary | ICD-10-CM

## 2013-07-21 DIAGNOSIS — G4710 Hypersomnia, unspecified: Secondary | ICD-10-CM

## 2013-07-21 DIAGNOSIS — G4719 Other hypersomnia: Secondary | ICD-10-CM

## 2013-07-21 DIAGNOSIS — F329 Major depressive disorder, single episode, unspecified: Secondary | ICD-10-CM

## 2013-07-21 DIAGNOSIS — F3289 Other specified depressive episodes: Secondary | ICD-10-CM

## 2013-07-21 MED ORDER — MIRTAZAPINE 15 MG PO TABS: 15.0000 mg | ORAL_TABLET | Freq: Every day | ORAL | Status: DC

## 2013-07-21 NOTE — Patient Instructions (Addendum)
1. Change Carbidopa Levodopa to one tablet 4 times daily.  2. Discontinue Lexapro. Start your prescription for Remeron. This has been sent to your pharmacy.  3. We will call you with an appt for your sleep study.  4. Follow up 2 weeks.

## 2013-07-21 NOTE — Progress Notes (Signed)
Jeremy Terry was seen today in the movement disorders clinic for neurologic consultation at the request of [name] [name], MD.  He was previously seen by Dr. Leta Terry.  They're requesting a second opinion regarding possible DBS surgery.  He has an appointment with [facility] in this regard as well.  The pt began with L hand tremor that began in 07/2009.  He was started on requip in 06/2010, but after 2 years on the medication, it was tapered off just 2 days ago because of compulsive spending.  When he was initally placed on the medication he had some nausea but his wife did think that it was effective. He has been on levodopa since 09/2011.  This was definitely effective.  He takes 1 1/2 tablets three to four times per day.  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  His wife notices that the medication may wear off about 30 minutes to one hour before the next dosage, as she will notice a resurgence of tremor.  He takes medication at 5 AM, 12 PM, often misses the 3 PM dose and then was taken another at 6 PM.  10/03/12:   The patient presents today with his wife who supplements the history.  They report that he did not get started on Comtan.  His pharmacy told them that it was backordered.  However, when we called the pharmacy and they stated that they had not received the prescription.  The patient did have a fall on July 16 resulting in a wrist fracture.  He was outside and fell over a bucket when he tripped. 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  He is currently taking his carbidopa/levodopa 25/100, 1-1/2 tablets at 8 AM/12 PM/3 M/6 PM.  He cannot tell when the medication wears off.  His wife states that it definitely does help.  The patient did go to therapy.  It was recommended that he use a cane.  He has not been doing that.  His wife recalls that he has been very impulsive.  He  is having major personality change.  He curses a lot and he never did this previously.  He has no hallucinations.  He is having intermittent diplopia.  10/31/12 update:  The pt presents with his wife who supplements the hx.  His Aricept was increased last time to 10 mg daily.  His Requip was d/c.  Is not sure of the levodopa is helping but he continues to take carbidopa/levodopa 25/100, 1-1/2 tablets 4 times per day.  I did discontinue his Cymbalta last visit as he was using it for peripheral neuropathy, but had no symptoms of paresthesias.  He noticed no increase in paresthesias with the discontinuation of Cymbalta.  His mood remains good.  His wife states that his personality has been much better and he is not cursing like he was.  He continues to have some intermittent double vision.  He is exercising some, although not necessarily faithfully.  11/27/12 update:  This patient is accompanied in the office by his spouse who supplements the history.  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  He is currently on Aricept 10 mg daily.  He is on carbidopa/levodopa 25/100, 1-1/2 tablets 4 times per day.  Last visit, I cautiously added Artane, because of his biggest complaint of tremor.  He reports that tremor is markedly better.  He did hold his carbidopa/levodopa at my request prior to coming into the office today.  He really feels no different off of the medication.  His biggest complaint is significant knee pain.  It is preventing him from exercising.  I did review his MRI of the brain that was recently done.  There was ventricular prominence, but I thought that the frontotemporal atrophy was greater than the atrophy posteriorly.  12/29/12 update:  This patient is accompanied in the office by his spouse who supplements the history.  He is on artane and doing well.  Last visit, he did not think that the carbidopa/levodopa 25/100 was helpful and so we stopped it.  He got much  worse after this was stopped (falls, trouble getting out of the bed) and so he went to the PCP on Friday and it was restarted.    He is feeling much better.  His mood is better.  There are no hallucinations.  He does c/o hypophonic speech that is worsening.  02/23/13:  This patient is accompanied in the office by his spouse who supplements the history.  Last visit his carbidopa/levodopa was restarted as he got worse when he was off of the medication.  He takes it at 53 AM/12 PM/3 PM/6 PM.  He is doing well with this and much better now that he is back on the medication.  He is also on aricept 10 mg daily.  He still has spells where he has a significant personality change and is mean to his wife in public, but just as quickly it seems to go away.  They just returned from a cruise and he had a good time.    His only c/o is tremor in the L hand.  He is supposed to be on artane but they don't know what happened to that.  I called the pharmacy and it was last picked up in [place].  He was doing better when on it without significant cognitive change. He is in [place] and doing well with that.  05/25/13 update:  He is accompanied by his wife who supplements the history.  Pt is on levodopa at 8am/12pm/3pm/6pm.  I restarted his artane as he really wanted to retry.  I cautioned them about memory but his wife noted deterioration of memory when he went back on it and she stopped it in Jan.  He has been more moody and depressed.  He has had more balance problems.  He fell on Saturday when he bent over to pick up the dog food dish.  He was supposed to go back to OT but he got a bad sinus infection and then he just got worse and was unmotivated to go back.  If he gets up in the middle of the night, he will sometimes be confused as to how to get the bathroom.  No hallucinations.    07/21/13 update:  Pt presents with wife, who supplements the history.  Is having trouble sleeping at night but is sleeping during the day.  Snores per wife.   Falls asleep watching movie or just when sitting down will doze off.  Still having compulsive eating/buying.  Bought a truck behind wife's back and becoming marial issue.   Having trouble losing weight.  Becoming aggressive with wife at times.  Added lexapro last visit but not helping.   PREVIOUS MEDICATIONS: Requip (compulsive spending), Cymbalta  ALLERGIES:   Allergies  Allergen Reactions  . Hydrocodone   .  Morphine   . Sulfa Antibiotics     CURRENT MEDICATIONS:  Current Outpatient Prescriptions on File Prior to Visit  Medication Sig Dispense Refill  . albuterol (PROVENTIL HFA;VENTOLIN HFA) 108 (90 BASE) MCG/ACT inhaler Inhale 2 puffs into the lungs every 6 (six) hours as needed for wheezing or shortness of breath.  1 Inhaler  0  . Albuterol Sulfate (VENTOLIN HFA IN) Inhale into the lungs. Inhale 2 puffs q 6 hours prn wheezing      . aspirin EC 81 MG tablet Take 81 mg by mouth daily.      Marland Kitchen atorvastatin (LIPITOR) 40 MG tablet TAKE ONE TABLET BY MOUTH DAILY  30 tablet  0  . carbidopa-levodopa (SINEMET IR) 25-100 MG per tablet Take 1.5 tablets by mouth 4 (four) times daily.  540 tablet  3  . clopidogrel (PLAVIX) 75 MG tablet Take 1 tablet (75 mg total) by mouth daily.  30 tablet  12  . donepezil (ARICEPT) 10 MG tablet Take 1 tablet (10 mg total) by mouth at bedtime.  90 tablet  3  . escitalopram (LEXAPRO) 10 MG tablet Take 1 tablet (10 mg total) by mouth daily.  90 tablet  1  . finasteride (PROSCAR) 5 MG tablet TAKE 1 TABLET BY MOUTH EVERY DAY  30 tablet  3  . fluticasone (FLONASE) 50 MCG/ACT nasal spray Place 2 sprays into both nostrils daily.  16 g  6  . losartan (COZAAR) 100 MG tablet TAKE 1 TABLET BY MOUTH EVERY DAY  30 tablet  11  . meloxicam (MOBIC) 15 MG tablet TAKE 1 TABLET (15 MG TOTAL) BY MOUTH DAILY.  30 tablet  2  . metoprolol succinate (TOPROL-XL) 50 MG 24 hr tablet Take 1 tablet (50 mg total) by mouth daily.  30 tablet  6  . Multiple Vitamin (MULTIVITAMIN) capsule Take 1  capsule by mouth daily.        . silodosin (RAPAFLO) 8 MG CAPS capsule Take 1 capsule (8 mg total) by mouth daily with breakfast. For prostate  30 capsule  6  . glucose blood test strip Use as instructed  100 each  1  . glucose monitoring kit (FREESTYLE) monitoring kit 1 each by Does not apply route as needed for other.  1 each  0  . Lancets 28G MISC 1 Container by Does not apply route daily.  100 each  1  . nitroGLYCERIN (NITROSTAT) 0.4 MG SL tablet Place 1 tablet (0.4 mg total) under the tongue every 5 (five) minutes as needed.  25 tablet  11  . [DISCONTINUED] rOPINIRole (REQUIP) 1 MG tablet Take 1 mg by mouth 3 (three) times daily.        No current facility-administered medications on file prior to visit.    PAST MEDICAL HISTORY:   Past Medical History  Diagnosis Date  . Coronary artery disease   . Hypertension   . Diabetes mellitus   . Hypercholesteremia   . BPH (benign prostatic hypertrophy)   . DJD (degenerative joint disease)   . Asthma   . Memory loss   . Ventral hernia   . GERD (gastroesophageal reflux disease)   . Allergic rhinitis   . Parkinsonian syndrome   . Hypogonadism male   . Neuropathy of leg     Bilateral  . Depression   . Parkinson disease     PAST SURGICAL HISTORY:   Past Surgical History  Procedure Laterality Date  . Coronary stent placement  2003  . Shoulder surgery  2011  rotator cuff    SOCIAL HISTORY:   History   Social History  . Marital Status: Married    Spouse Name: N/A    Number of Children: 2  . Years of Education: 14   Occupational History  . retired     Psychologist, sport and exercise   Social History Main Topics  . Smoking status: Former Smoker    Quit date: 02/26/1998  . Smokeless tobacco: Never Used  . Alcohol Use: Yes     Comment: Occ  . Drug Use: No  . Sexual Activity: Yes    Partners: Female   Other Topics Concern  . Not on file   Social History Narrative   Pt lives at home with his spouse.   Caffeine Use: Does not consume     FAMILY HISTORY:   Family Status  Relation Status Death Age  . Father Deceased 64    CHF  . Mother Deceased     pneumonia  . Child Alive     2, healthy    ROS:  A complete 10 system review of systems was obtained and was unremarkable apart from what is mentioned above.  PHYSICAL EXAMINATION:    VITALS:   Filed Vitals:   07/21/13 1039  BP: 144/78  Pulse: 74  Height: _0  (1.753 m)  Weight: 229 lb (103.874 kg)   BP Readings from Last 3 Encounters:  07/21/13 144/78  05/25/13 130/70  05/01/13 138/72    GEN:  The patient appears stated age and is in NAD. HEENT:  Normocephalic, atraumatic.  The mucous membranes are moist. The superficial temporal arteries are without ropiness or tenderness. CV:  RRR Lungs:  CTAB Neck/HEME:  There are no carotid bruits bilaterally.  Neurological examination:  Orientation: The patient is alert and oriented x3.  A MoCA was performed in 01/2013 and the pt scored 17/30.   Relies on wife for hx.   Cranial nerves: There is good facial symmetry.  There is facial hypomimia.  Pupils are equal round and reactive to light bilaterally. Fundoscopic exam reveals clear margins bilaterally.Trouble with both upgaze and downgaze.  There are definite square wave jerks.  The visual fields are full to confrontational testing. The speech is fluent and clear.  At times, it becomes quite hypophonic and he has significant difficulty with the guttural sounds.  Soft palate rises symmetrically and there is no tongue deviation. Hearing is intact to conversational tone. Motor: Strength is 5/5 in the bilateral upper and lower extremities.   Shoulder shrug is equal and symmetric.  There is no pronator drift. Deep tendon reflexes: Deep tendon reflexes are 3/4 at the bilateral biceps, triceps, brachioradialis, patella and achilles. Plantar response is upgoing on the left and downgoing on the right.  Movement examination: Tone: There is normal tone in the bilateral upper  extremities.  The tone in the lower extremities is normal.  Abnormal movements: There is a mild to moderate left upper extremity resting tremor. There is no tremor in the legs Coordination:  There is minimal decremation with hand opening and closing on the left, but finger taps were good bilaterally.  He has difficulty with heel taps and toe taps bilaterally. Gait and Station: The patient has no difficulty arising out of a deep-seated chair without the use of the hands. The patient's stride length is good today  Labs: I got lab work from his primary care physician dated September 08, 2012; his sodium was 138, potassium 4.4, chloride 96, CO2 26, BUN 18 creatinine 1.00.  Hemoglobin A1c was 6.3.  White blood cells were 9.4, hemoglobin 16.3, hematocrit 46.7 and platelets 300.  ASSESSMENT/PLAN:  1.  Parkinsonism.  -He does have some atypical features that are worrisome for one of the atypical parkinsonian states.  He is hyperreflexic, has an upgoing toe on the right, has square wave jerks, has diplopia and has had personality changes.  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  -He will continue on the aricept 10 mg daily.  Re-do MoCA next visit.    -He did not do well off of the levodopa but even off requip has compulsive behaviors.  May be part of disease.  Levodopa less likely to contribute and didn't do well off of it but will try to decrease from 1.5 tablets qid to 1 tablet at 8am/12pm/3pm/6pm   -He will continue off the artane as that made him worse  -doing better with gait with PT/OT 2.    Peripheral neuropathy, likely secondary to diabetes.  -He is off of Cymbalta and doing well. 3.  Depression  -d/c lexapro and start remeron, for depression and for insomnia.  -May need psychiatry input soon. 4.  EDS  -features worrisome for OSAS. Will order PSG.  May contribute to weight issues as well.   5.  Return in about 2  weeks (around 08/04/2013).

## 2013-07-21 NOTE — Telephone Encounter (Signed)
Left message on machine for patient to call back. To make patient aware of sleep study date/time. Scheduled first available at [facility] for September 01, 2013 to arrive at 8:00 pm. They will mail out a packet with more information. Awaiting call back to make patient aware.

## 2013-07-22 ENCOUNTER — Ambulatory Visit: Payer: Medicare Other | Admitting: Occupational Therapy

## 2013-07-22 ENCOUNTER — Ambulatory Visit: Payer: Medicare Other | Admitting: Physical Therapy

## 2013-07-22 DIAGNOSIS — Z5189 Encounter for other specified aftercare: Secondary | ICD-10-CM | POA: Diagnosis not present

## 2013-07-23 ENCOUNTER — Telehealth: Payer: Self-pay | Admitting: Neurology

## 2013-07-23 ENCOUNTER — Ambulatory Visit: Payer: Medicare Other | Admitting: Physical Therapy

## 2013-07-23 ENCOUNTER — Ambulatory Visit: Payer: Medicare Other | Admitting: Occupational Therapy

## 2013-07-23 DIAGNOSIS — Z5189 Encounter for other specified aftercare: Secondary | ICD-10-CM | POA: Diagnosis not present

## 2013-07-23 NOTE — Telephone Encounter (Signed)
[facility] making patient aware of date/time and to call with any questions.

## 2013-07-23 NOTE — Telephone Encounter (Signed)
Tried to call patient back with no answer. Information already given via message.

## 2013-07-23 NOTE — Telephone Encounter (Signed)
Pt's spouse called/ returning a call regarding pt's sleep study.

## 2013-07-24 ENCOUNTER — Telehealth: Payer: Self-pay | Admitting: Neurology

## 2013-07-24 NOTE — Telephone Encounter (Signed)
Called and spoke with patient's wife - gave her the number to the sleep center at [phone] to r/s to a convenient time.

## 2013-07-24 NOTE — Telephone Encounter (Signed)
Pt wife called and states that they will be out of town when you set up the sleep test please resch and call her back [phone] or [phone]

## 2013-07-25 ENCOUNTER — Other Ambulatory Visit: Payer: Self-pay | Admitting: Family Medicine

## 2013-07-27 NOTE — Telephone Encounter (Signed)
Medication refilled per protocol. 

## 2013-07-28 ENCOUNTER — Ambulatory Visit: Payer: Medicare Other | Admitting: Physical Therapy

## 2013-07-28 ENCOUNTER — Ambulatory Visit: Payer: Medicare Other | Attending: Neurology | Admitting: Occupational Therapy

## 2013-07-28 DIAGNOSIS — Z5189 Encounter for other specified aftercare: Secondary | ICD-10-CM | POA: Insufficient documentation

## 2013-07-28 DIAGNOSIS — G20A1 Parkinson's disease without dyskinesia, without mention of fluctuations: Secondary | ICD-10-CM | POA: Insufficient documentation

## 2013-07-28 DIAGNOSIS — G20 Parkinson's disease: Secondary | ICD-10-CM | POA: Insufficient documentation

## 2013-07-28 DIAGNOSIS — R269 Unspecified abnormalities of gait and mobility: Secondary | ICD-10-CM | POA: Insufficient documentation

## 2013-07-28 DIAGNOSIS — R471 Dysarthria and anarthria: Secondary | ICD-10-CM | POA: Insufficient documentation

## 2013-07-31 ENCOUNTER — Ambulatory Visit: Payer: Medicare Other | Admitting: Occupational Therapy

## 2013-07-31 ENCOUNTER — Ambulatory Visit: Payer: Medicare Other | Admitting: Physical Therapy

## 2013-08-03 ENCOUNTER — Ambulatory Visit (INDEPENDENT_AMBULATORY_CARE_PROVIDER_SITE_OTHER): Payer: Medicare Other | Admitting: Family Medicine

## 2013-08-03 ENCOUNTER — Telehealth: Payer: Self-pay | Admitting: Neurology

## 2013-08-03 ENCOUNTER — Encounter: Payer: Self-pay | Admitting: Family Medicine

## 2013-08-03 VITALS — BP 130/76 | HR 76 | Temp 97.8°F | Resp 14 | Ht 69.0 in | Wt 226.0 lb

## 2013-08-03 DIAGNOSIS — I2510 Atherosclerotic heart disease of native coronary artery without angina pectoris: Secondary | ICD-10-CM

## 2013-08-03 DIAGNOSIS — I10 Essential (primary) hypertension: Secondary | ICD-10-CM

## 2013-08-03 DIAGNOSIS — G4700 Insomnia, unspecified: Secondary | ICD-10-CM

## 2013-08-03 DIAGNOSIS — M7989 Other specified soft tissue disorders: Secondary | ICD-10-CM

## 2013-08-03 DIAGNOSIS — E119 Type 2 diabetes mellitus without complications: Secondary | ICD-10-CM

## 2013-08-03 DIAGNOSIS — E785 Hyperlipidemia, unspecified: Secondary | ICD-10-CM

## 2013-08-03 DIAGNOSIS — N400 Enlarged prostate without lower urinary tract symptoms: Secondary | ICD-10-CM

## 2013-08-03 LAB — COMPLETE METABOLIC PANEL WITH GFR
ALBUMIN: 4.1 g/dL (ref 3.5–5.2)
ALT: 12 U/L (ref 0–53)
AST: 20 U/L (ref 0–37)
Alkaline Phosphatase: 55 U/L (ref 39–117)
BUN: 19 mg/dL (ref 6–23)
CO2: 27 meq/L (ref 19–32)
Calcium: 8.9 mg/dL (ref 8.4–10.5)
Chloride: 104 mEq/L (ref 96–112)
Creat: 0.87 mg/dL (ref 0.50–1.35)
GFR, Est Non African American: 89 mL/min
Glucose, Bld: 119 mg/dL — ABNORMAL HIGH (ref 70–99)
Potassium: 4.4 mEq/L (ref 3.5–5.3)
SODIUM: 139 meq/L (ref 135–145)
TOTAL PROTEIN: 6.6 g/dL (ref 6.0–8.3)
Total Bilirubin: 0.8 mg/dL (ref 0.2–1.2)

## 2013-08-03 LAB — CBC WITH DIFFERENTIAL/PLATELET
BASOS ABS: 0.1 10*3/uL (ref 0.0–0.1)
BASOS PCT: 1 % (ref 0–1)
EOS ABS: 0.2 10*3/uL (ref 0.0–0.7)
Eosinophils Relative: 4 % (ref 0–5)
HCT: 43.5 % (ref 39.0–52.0)
Hemoglobin: 14.9 g/dL (ref 13.0–17.0)
Lymphocytes Relative: 19 % (ref 12–46)
Lymphs Abs: 1.2 10*3/uL (ref 0.7–4.0)
MCH: 31.6 pg (ref 26.0–34.0)
MCHC: 34.3 g/dL (ref 30.0–36.0)
MCV: 92.4 fL (ref 78.0–100.0)
Monocytes Absolute: 0.6 10*3/uL (ref 0.1–1.0)
Monocytes Relative: 10 % (ref 3–12)
NEUTROS PCT: 66 % (ref 43–77)
Neutro Abs: 4.1 10*3/uL (ref 1.7–7.7)
PLATELETS: 205 10*3/uL (ref 150–400)
RBC: 4.71 MIL/uL (ref 4.22–5.81)
RDW: 13.6 % (ref 11.5–15.5)
WBC: 6.2 10*3/uL (ref 4.0–10.5)

## 2013-08-03 LAB — LIPID PANEL
Cholesterol: 122 mg/dL (ref 0–200)
HDL: 33 mg/dL — ABNORMAL LOW (ref >39)
LDL CALC: 49 mg/dL (ref 0–99)
Total CHOL/HDL Ratio: 3.7 Ratio
Triglycerides: 198 mg/dL — ABNORMAL HIGH (ref <150)
VLDL: 40 mg/dL (ref 0–40)

## 2013-08-03 LAB — HEMOGLOBIN A1C
HEMOGLOBIN A1C: 6.3 % — AB (ref <5.7)
Mean Plasma Glucose: 134 mg/dL — ABNORMAL HIGH (ref <117)

## 2013-08-03 MED ORDER — FUROSEMIDE 20 MG PO TABS: 20.0000 mg | ORAL_TABLET | Freq: Every day | ORAL | Status: DC

## 2013-08-03 MED ORDER — ZOLPIDEM TARTRATE 10 MG PO TABS: 5.0000 mg | ORAL_TABLET | Freq: Every evening | ORAL | Status: DC | PRN

## 2013-08-03 NOTE — Telephone Encounter (Signed)
Kissa with Dr Dohmeier's office called to see if we would put in a referral for patient to have a sleep evaluation by Dr Brett Fairy. He is scheduled for a sleep study on 09/01/2013 but would like to see Dr Brett Fairy in consultation and have sleep study at [facility]. Please advise if okay to send referral?

## 2013-08-03 NOTE — Telephone Encounter (Signed)
Kissa made aware.

## 2013-08-03 NOTE — Telephone Encounter (Signed)
LMOM for patient to call back and ask for me to discuss sleep study.

## 2013-08-03 NOTE — Telephone Encounter (Signed)
I only need the PSG

## 2013-08-03 NOTE — Progress Notes (Signed)
Subjective:    Patient ID: Jeremy Terry, male    DOB: 04/28/1945, 68 y.o.   MRN: [id number]  HPI Patient is here today for regular checkup. Over the last few weeks he's noticed progressive edema in both legs. He has +1 pitting edema to the level of the knees bilaterally. He also has venous stasis changes on both legs and several large varicosities on his right leg. He denies any chest pain or shortness of breath. He denies any dyspnea on exertion. He denies any orthopnea. He denies any symptoms of sleep apnea. He also has BPH. I have maximized medical therapy including rapaflo and finasteride without benefit.  He is just about possibly seeing a urologist. He is also overdue for check of his cholesterol as well as his diabetes test. He denies any blurred vision or polydipsia. He denies any myalgias or right upper quadrant pain. His main concern is he is having difficult time sleeping. He is requiring something on a nightly basis to help him sleep. Unfortunately over-the-counter medication is no longer beneficial. Past Medical History  Diagnosis Date  . Coronary artery disease   . Hypertension   . Diabetes mellitus   . Hypercholesteremia   . BPH (benign prostatic hypertrophy)   . DJD (degenerative joint disease)   . Asthma   . Memory loss   . Ventral hernia   . GERD (gastroesophageal reflux disease)   . Allergic rhinitis   . Parkinsonian syndrome   . Hypogonadism male   . Neuropathy of leg     Bilateral  . Depression   . Parkinson disease    Current Outpatient Prescriptions on File Prior to Visit  Medication Sig Dispense Refill  . albuterol (PROVENTIL HFA;VENTOLIN HFA) 108 (90 BASE) MCG/ACT inhaler Inhale 2 puffs into the lungs every 6 (six) hours as needed for wheezing or shortness of breath.  1 Inhaler  0  . Albuterol Sulfate (VENTOLIN HFA IN) Inhale into the lungs. Inhale 2 puffs q 6 hours prn wheezing      . aspirin EC 81 MG tablet Take 81 mg by mouth daily.      Marland Kitchen atorvastatin  (LIPITOR) 40 MG tablet TAKE ONE TABLET BY MOUTH DAILY  30 tablet  0  . carbidopa-levodopa (SINEMET IR) 25-100 MG per tablet Take 1.5 tablets by mouth 4 (four) times daily.  540 tablet  3  . clopidogrel (PLAVIX) 75 MG tablet Take 1 tablet (75 mg total) by mouth daily.  30 tablet  12  . donepezil (ARICEPT) 10 MG tablet Take 1 tablet (10 mg total) by mouth at bedtime.  90 tablet  3  . escitalopram (LEXAPRO) 10 MG tablet Take 1 tablet (10 mg total) by mouth daily.  90 tablet  1  . finasteride (PROSCAR) 5 MG tablet TAKE 1 TABLET BY MOUTH EVERY DAY  30 tablet  3  . fluticasone (FLONASE) 50 MCG/ACT nasal spray Place 2 sprays into both nostrils daily.  16 g  6  . glipiZIDE (GLUCOTROL) 5 MG tablet TAKE 1 TABLET (5 MG TOTAL) BY MOUTH 2 (TWO) TIMES DAILY BEFORE A MEAL.  60 tablet  0  . glucose blood test strip Use as instructed  100 each  1  . glucose monitoring kit (FREESTYLE) monitoring kit 1 each by Does not apply route as needed for other.  1 each  0  . Lancets 28G MISC 1 Container by Does not apply route daily.  100 each  1  . losartan (COZAAR) 100 MG tablet  TAKE 1 TABLET BY MOUTH EVERY DAY  30 tablet  11  . meloxicam (MOBIC) 15 MG tablet TAKE 1 TABLET BY MOUTH EVERY DAY  30 tablet  5  . metoprolol succinate (TOPROL-XL) 50 MG 24 hr tablet Take 1 tablet (50 mg total) by mouth daily.  30 tablet  6  . mirtazapine (REMERON) 15 MG tablet Take 1 tablet (15 mg total) by mouth at bedtime.  30 tablet  5  . Multiple Vitamin (MULTIVITAMIN) capsule Take 1 capsule by mouth daily.        . nitroGLYCERIN (NITROSTAT) 0.4 MG SL tablet Place 1 tablet (0.4 mg total) under the tongue every 5 (five) minutes as needed.  25 tablet  11  . RAPAFLO 8 MG CAPS capsule TAKE 1 CAPSULE (8 MG TOTAL) BY MOUTH DAILY WITH BREAKFAST. FOR PROSTATE  30 capsule  0  . [DISCONTINUED] rOPINIRole (REQUIP) 1 MG tablet Take 1 mg by mouth 3 (three) times daily.        No current facility-administered medications on file prior to visit.   Past  Surgical History  Procedure Laterality Date  . Coronary stent placement  2003  . Shoulder surgery  2011    rotator cuff   Allergies  Allergen Reactions  . Hydrocodone   . Morphine   . Sulfa Antibiotics    History   Social History  . Marital Status: Married    Spouse Name: N/A    Number of Children: 2  . Years of Education: 14   Occupational History  . retired     Psychologist, sport and exercise   Social History Main Topics  . Smoking status: Former Smoker    Quit date: 02/26/1998  . Smokeless tobacco: Never Used  . Alcohol Use: Yes     Comment: Occ  . Drug Use: No  . Sexual Activity: Yes    Partners: Female   Other Topics Concern  . Not on file   Social History Narrative   Pt lives at home with his spouse.   Caffeine Use: Does not consume      Review of Systems  All other systems reviewed and are negative.      Objective:   Physical Exam  Vitals reviewed. Constitutional: He appears well-developed and well-nourished.  Cardiovascular: Normal rate, regular rhythm and normal heart sounds.  Exam reveals no gallop and no friction rub.   No murmur heard. Pulmonary/Chest: Effort normal and breath sounds normal. No respiratory distress. He has no wheezes. He has no rales.  Abdominal: Soft. Bowel sounds are normal. He exhibits no distension. There is no tenderness. There is no rebound and no guarding.  Musculoskeletal: He exhibits edema.  Skin: Rash noted.   +1 edema in both legs to the level of the knees. Venous stasis changes in both legs to the shin.  Patient has parkinsonian features and a resting pill-rolling tremor.        Assessment & Plan:  1. Type II or unspecified type diabetes mellitus without mention of complication, not stated as uncontrolled Return fasting for a CMP and hemoglobin A1c. - COMPLETE METABOLIC PANEL WITH GFR - Hemoglobin A1c  2. BPH (benign prostatic hypertrophy) I will check a PSA. However I have maximized medical therapy. At this point I will  consult a urologist. The patient may also have an element of overactive bladder - PSA, Medicare - Ambulatory referral to Urology  3. HTN (hypertension) Blood pressure is well controlled. I made no changes in his medication at this time. -  COMPLETE METABOLIC PANEL WITH GFR - CBC with Differential  4. HLD (hyperlipidemia) Return fasting for a fasting lipid panel. Goal LDL cholesterol is less than 70 - Lipid panel  5. Insomnia Start the patient on Ambien 5 mg poqhs prn insomnia. - zolpidem (AMBIEN) 10 MG tablet; Take 0.5 tablets (5 mg total) by mouth at bedtime as needed for sleep.  Dispense: 15 tablet; Refill: 1  6. CAD (coronary artery disease) - 2D Echocardiogram without contrast; Future  7. Leg swelling I believe this is likely due to increased sodium in his diet cut is chronic venous insufficiency, I will check a 2-D echocardiogram to rule out evidence of congestive heart failure given his history of CAD. - 2D Echocardiogram without contrast; Future

## 2013-08-04 ENCOUNTER — Ambulatory Visit: Payer: Medicare Other | Admitting: Occupational Therapy

## 2013-08-04 ENCOUNTER — Ambulatory Visit: Payer: Medicare Other | Admitting: Physical Therapy

## 2013-08-04 LAB — PSA, MEDICARE: PSA: 0.49 ng/mL (ref <=4.00)

## 2013-08-04 NOTE — Telephone Encounter (Signed)
Spoke with patient's wife who states she lost the number to [facility] sleep center and called [facility] by mistake. Number provided again to the sleep center for them to r/s sleep study if needed.

## 2013-08-04 NOTE — Telephone Encounter (Signed)
Pt's spouse called/returning your call at 12:22PM c/b [phone]

## 2013-08-05 ENCOUNTER — Other Ambulatory Visit: Payer: Medicare Other

## 2013-08-06 ENCOUNTER — Ambulatory Visit: Payer: Medicare Other | Admitting: Occupational Therapy

## 2013-08-06 ENCOUNTER — Ambulatory Visit: Payer: Medicare Other | Admitting: Physical Therapy

## 2013-08-07 ENCOUNTER — Institutional Professional Consult (permissible substitution): Payer: Medicare Other | Admitting: Neurology

## 2013-08-11 ENCOUNTER — Encounter: Payer: Self-pay | Admitting: Neurology

## 2013-08-11 ENCOUNTER — Ambulatory Visit (INDEPENDENT_AMBULATORY_CARE_PROVIDER_SITE_OTHER): Payer: Medicare Other | Admitting: Neurology

## 2013-08-11 ENCOUNTER — Ambulatory Visit: Payer: Medicare Other | Admitting: Occupational Therapy

## 2013-08-11 ENCOUNTER — Ambulatory Visit: Payer: Medicare Other | Admitting: Physical Therapy

## 2013-08-11 VITALS — BP 126/76 | HR 92 | Resp 18 | Ht 69.0 in | Wt 234.0 lb

## 2013-08-11 DIAGNOSIS — G4710 Hypersomnia, unspecified: Secondary | ICD-10-CM

## 2013-08-11 DIAGNOSIS — E0842 Diabetes mellitus due to underlying condition with diabetic polyneuropathy: Secondary | ICD-10-CM

## 2013-08-11 DIAGNOSIS — E1349 Other specified diabetes mellitus with other diabetic neurological complication: Secondary | ICD-10-CM

## 2013-08-11 DIAGNOSIS — G20 Parkinson's disease: Secondary | ICD-10-CM

## 2013-08-11 DIAGNOSIS — F3289 Other specified depressive episodes: Secondary | ICD-10-CM

## 2013-08-11 DIAGNOSIS — E1142 Type 2 diabetes mellitus with diabetic polyneuropathy: Secondary | ICD-10-CM

## 2013-08-11 DIAGNOSIS — F329 Major depressive disorder, single episode, unspecified: Secondary | ICD-10-CM

## 2013-08-11 DIAGNOSIS — I2510 Atherosclerotic heart disease of native coronary artery without angina pectoris: Secondary | ICD-10-CM

## 2013-08-11 MED ORDER — CARBIDOPA-LEVODOPA ER 23.75-95 MG PO CPCR: 4.0000 | ORAL_CAPSULE | Freq: Three times a day (TID) | ORAL | Status: DC

## 2013-08-11 NOTE — Patient Instructions (Signed)
1. Starting on 08/21/13 switch to [place] samples. Take them as follows: Take 4 tablets in the morning, take 4 tablets in the afternoon, 3 tablets in the evening. Take prior to meals. Call us on 08/25/2013 and let us know how you are doing.  2. Follow up after sleep study in August.

## 2013-08-11 NOTE — Progress Notes (Signed)
Jeremy Terry was seen today in the movement disorders clinic for neurologic consultation at the request of [name] [name], MD.  He was previously seen by Dr. [name].  They're requesting a second opinion regarding possible DBS surgery.  He has an appointment with [facility] in this regard as well.  The pt began with L hand tremor that began in 07/2009.  He was started on requip in 06/2010, but after 2 years on the medication, it was tapered off just 2 days ago because of compulsive spending.  When he was initally placed on the medication he had some nausea but his wife did think that it was effective. He has been on levodopa since 09/2011.  This was definitely effective.  He takes 1 1/2 tablets three to four times per day.  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  His wife notices that the medication may wear off about 30 minutes to one hour before the next dosage, as she will notice a resurgence of tremor.  He takes medication at 5 AM, 12 PM, often misses the 3 PM dose and then was taken another at 6 PM.  10/03/12:   The patient presents today with his wife who supplements the history.  They report that he did not get started on Comtan.  His pharmacy told them that it was backordered.  However, when we called the pharmacy and they stated that they had not received the prescription.  The patient did have a fall on July 16 resulting in a wrist fracture.  He was outside and fell over a bucket when he tripped. 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  He is currently taking his carbidopa/levodopa 25/100, 1-1/2 tablets at 8 AM/12 PM/3 M/6 PM.  He cannot tell when the medication wears off.  His wife states that it definitely does help.  The patient did go to therapy.  It was recommended that he use a cane.  He has not been doing that.  His wife recalls that he has been very impulsive.  He  is having major personality change.  He curses a lot and he never did this previously.  He has no hallucinations.  He is having intermittent diplopia.  10/31/12 update:  The pt presents with his wife who supplements the hx.  His Aricept was increased last time to 10 mg daily.  His Requip was d/c.  Is not sure of the levodopa is helping but he continues to take carbidopa/levodopa 25/100, 1-1/2 tablets 4 times per day.  I did discontinue his Cymbalta last visit as he was using it for peripheral neuropathy, but had no symptoms of paresthesias.  He noticed no increase in paresthesias with the discontinuation of Cymbalta.  His mood remains good.  His wife states that his personality has been much better and he is not cursing like he was.  He continues to have some intermittent double vision.  He is exercising some, although not necessarily faithfully.  11/27/12 update:  This patient is accompanied in the office by his spouse who supplements the history.  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  He is currently on Aricept 10 mg daily.  He is on carbidopa/levodopa 25/100, 1-1/2 tablets 4 times per day.  Last visit, I cautiously added Artane, because of his biggest complaint of tremor.  He reports that tremor is markedly better.  He did hold his carbidopa/levodopa at my request prior to coming into the office today.  He really feels no different off of the medication.  His biggest complaint is significant knee pain.  It is preventing him from exercising.  I did review his MRI of the brain that was recently done.  There was ventricular prominence, but I thought that the frontotemporal atrophy was greater than the atrophy posteriorly.  12/29/12 update:  This patient is accompanied in the office by his spouse who supplements the history.  He is on artane and doing well.  Last visit, he did not think that the carbidopa/levodopa 25/100 was helpful and so we stopped it.  He got much  worse after this was stopped (falls, trouble getting out of the bed) and so he went to the PCP on Friday and it was restarted.    He is feeling much better.  His mood is better.  There are no hallucinations.  He does c/o hypophonic speech that is worsening.  02/23/13:  This patient is accompanied in the office by his spouse who supplements the history.  Last visit his carbidopa/levodopa was restarted as he got worse when he was off of the medication.  He takes it at 52 AM/12 PM/3 PM/6 PM.  He is doing well with this and much better now that he is back on the medication.  He is also on aricept 10 mg daily.  He still has spells where he has a significant personality change and is mean to his wife in public, but just as quickly it seems to go away.  They just returned from a cruise and he had a good time.    His only c/o is tremor in the L hand.  He is supposed to be on artane but they don't know what happened to that.  I called the pharmacy and it was last picked up in [place].  He was doing better when on it without significant cognitive change. He is in [place] and doing well with that.  05/25/13 update:  He is accompanied by his wife who supplements the history.  Pt is on levodopa at 8am/12pm/3pm/6pm.  I restarted his artane as he really wanted to retry.  I cautioned them about memory but his wife noted deterioration of memory when he went back on it and she stopped it in Jan.  He has been more moody and depressed.  He has had more balance problems.  He fell on Saturday when he bent over to pick up the dog food dish.  He was supposed to go back to OT but he got a bad sinus infection and then he just got worse and was unmotivated to go back.  If he gets up in the middle of the night, he will sometimes be confused as to how to get the bathroom.  No hallucinations.    07/21/13 update:  Pt presents with wife, who supplements the history.  Is having trouble sleeping at night but is sleeping during the day.  Snores per wife.   Falls asleep watching movie or just when sitting down will doze off.  Still having compulsive eating/buying.  Bought a truck behind wife's back and becoming marial issue.   Having trouble losing weight.  Becoming aggressive with wife at times.  Added lexapro last visit but not helping.   08/11/13 update:  The patient presents today accompanied by his wife who supplements the history.  The patient  has been sleeping better.  The patient reports that he feels somewhat more confused, but his wife disagrees.  The patient did decrease the levodopa and while that compulsive behaviors are better, his balance is much worse.  Overall, the patient feels that he is worse on the lower dose of levodopa, even though his wife is happy that the compulsive behaviors are better.  PREVIOUS MEDICATIONS: Requip (compulsive spending), Cymbalta  ALLERGIES:   Allergies  Allergen Reactions  . Hydrocodone   . Morphine   . Sulfa Antibiotics     CURRENT MEDICATIONS:  Current Outpatient Prescriptions on File Prior to Visit  Medication Sig Dispense Refill  . albuterol (PROVENTIL HFA;VENTOLIN HFA) 108 (90 BASE) MCG/ACT inhaler Inhale 2 puffs into the lungs every 6 (six) hours as needed for wheezing or shortness of breath.  1 Inhaler  0  . Albuterol Sulfate (VENTOLIN HFA IN) Inhale into the lungs. Inhale 2 puffs q 6 hours prn wheezing      . aspirin EC 81 MG tablet Take 81 mg by mouth daily.      . clopidogrel (PLAVIX) 75 MG tablet Take 1 tablet (75 mg total) by mouth daily.  30 tablet  12  . donepezil (ARICEPT) 10 MG tablet Take 1 tablet (10 mg total) by mouth at bedtime.  90 tablet  3  . finasteride (PROSCAR) 5 MG tablet TAKE 1 TABLET BY MOUTH EVERY DAY  30 tablet  3  . fluticasone (FLONASE) 50 MCG/ACT nasal spray Place 2 sprays into both nostrils daily.  16 g  6  . furosemide (LASIX) 20 MG tablet Take 1 tablet (20 mg total) by mouth daily.  30 tablet  3  . glipiZIDE (GLUCOTROL) 5 MG tablet TAKE 1 TABLET (5 MG TOTAL) BY  MOUTH 2 (TWO) TIMES DAILY BEFORE A MEAL.  60 tablet  0  . glucose blood test strip Use as instructed  100 each  1  . glucose monitoring kit (FREESTYLE) monitoring kit 1 each by Does not apply route as needed for other.  1 each  0  . Lancets 28G MISC 1 Container by Does not apply route daily.  100 each  1  . losartan (COZAAR) 100 MG tablet TAKE 1 TABLET BY MOUTH EVERY DAY  30 tablet  11  . meloxicam (MOBIC) 15 MG tablet TAKE 1 TABLET BY MOUTH EVERY DAY  30 tablet  5  . metoprolol succinate (TOPROL-XL) 50 MG 24 hr tablet Take 1 tablet (50 mg total) by mouth daily.  30 tablet  6  . mirtazapine (REMERON) 15 MG tablet Take 1 tablet (15 mg total) by mouth at bedtime.  30 tablet  5  . Multiple Vitamin (MULTIVITAMIN) capsule Take 1 capsule by mouth daily.        . nitroGLYCERIN (NITROSTAT) 0.4 MG SL tablet Place 1 tablet (0.4 mg total) under the tongue every 5 (five) minutes as needed.  25 tablet  11  . RAPAFLO 8 MG CAPS capsule TAKE 1 CAPSULE (8 MG TOTAL) BY MOUTH DAILY WITH BREAKFAST. FOR PROSTATE  30 capsule  0  . zolpidem (AMBIEN) 10 MG tablet Take 0.5 tablets (5 mg total) by mouth at bedtime as needed for sleep.  15 tablet  1  . [DISCONTINUED] rOPINIRole (REQUIP) 1 MG tablet Take 1 mg by mouth 3 (three) times daily.        No current facility-administered medications on file prior to visit.    PAST MEDICAL HISTORY:   Past Medical History  Diagnosis Date  .  Coronary artery disease   . Hypertension   . Diabetes mellitus   . Hypercholesteremia   . BPH (benign prostatic hypertrophy)   . DJD (degenerative joint disease)   . Asthma   . Memory loss   . Ventral hernia   . GERD (gastroesophageal reflux disease)   . Allergic rhinitis   . Parkinsonian syndrome   . Hypogonadism male   . Neuropathy of leg     Bilateral  . Depression   . Parkinson disease     PAST SURGICAL HISTORY:   Past Surgical History  Procedure Laterality Date  . Coronary stent placement  2003  . Shoulder surgery  2011     rotator cuff    SOCIAL HISTORY:   History   Social History  . Marital Status: Married    Spouse Name: N/A    Number of Children: 2  . Years of Education: 14   Occupational History  . retired     Psychologist, sport and exercise   Social History Main Topics  . Smoking status: Former Smoker    Quit date: 02/26/1998  . Smokeless tobacco: Never Used  . Alcohol Use: Yes     Comment: Occ  . Drug Use: No  . Sexual Activity: Yes    Partners: Female   Other Topics Concern  . Not on file   Social History Narrative   Pt lives at home with his spouse.   Caffeine Use: Does not consume    FAMILY HISTORY:   Family Status  Relation Status Death Age  . Father Deceased 49    CHF  . Mother Deceased     pneumonia  . Child Alive     2, healthy    ROS:  A complete 10 system review of systems was obtained and was unremarkable apart from what is mentioned above.  PHYSICAL EXAMINATION:    VITALS:   Filed Vitals:   08/11/13 1305  BP: 126/76  Pulse: 92  Resp: 18  Height: $Remove'5\' 9"'RctLSHC$  (1.753 m)  Weight: 234 lb (106.142 kg)   BP Readings from Last 3 Encounters:  08/11/13 126/76  08/03/13 130/76  07/21/13 144/78    GEN:  The patient appears stated age and is in NAD. HEENT:  Normocephalic, atraumatic.  The mucous membranes are moist. The superficial temporal arteries are without ropiness or tenderness. CV:  RRR Lungs:  CTAB Neck/HEME:  There are no carotid bruits bilaterally.  Neurological examination:  Orientation: A MoCA was performed 08/11/13 and the patient scored 18/30.  A MoCA was performed in 01/2013 and the pt scored 17/30.   Relies on wife for hx.   Cranial nerves: There is good facial symmetry.  There is facial hypomimia.  Pupils are equal round and reactive to light bilaterally. Fundoscopic exam reveals clear margins bilaterally.Trouble with both upgaze and downgaze.  There are definite square wave jerks.  The visual fields are full to confrontational testing. The speech is fluent and clear.   At times, it becomes quite hypophonic and he has significant difficulty with the guttural sounds.  Soft palate rises symmetrically and there is no tongue deviation. Hearing is intact to conversational tone. Motor: Strength is 5/5 in the bilateral upper and lower extremities.   Shoulder shrug is equal and symmetric.  There is no pronator drift. Deep tendon reflexes: Deep tendon reflexes are 3/4 at the bilateral biceps, triceps, brachioradialis, patella and achilles. Plantar response is upgoing on the left and downgoing on the right.  Movement examination: Tone: There is normal  tone in the bilateral upper extremities.  The tone in the lower extremities is normal.  Abnormal movements: There is a mild to moderate left upper extremity resting tremor. There is no tremor in the legs Coordination:  There is minimal decremation with hand opening and closing on the left, but finger taps were good bilaterally.  He has difficulty with heel taps and toe taps bilaterally. Gait and Station: The patient has no difficulty arising out of a deep-seated chair without the use of the hands. The patient's stride length is good today  Labs: I got lab work from his primary care physician dated September 08, 2012; his sodium was 138, potassium 4.4, chloride 96, CO2 26, BUN 18 creatinine 1.00.  Hemoglobin A1c was 6.3.  White blood cells were 9.4, hemoglobin 16.3, hematocrit 46.7 and platelets 300.  ASSESSMENT/PLAN:  1.  Parkinsonism.  -He does have some atypical features that are worrisome for one of the atypical parkinsonian states.  He is hyperreflexic, has an upgoing toe on the right, has square wave jerks, has diplopia and has had personality changes.  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  -He will continue on the aricept 10 mg daily.    -We will try Rytary 95 mg, 4 tablets in the morning, 3 tablets in the afternoon, 4 tablets in the  evening.  Hopefully, he will have better efficacy, better balance, but that compulsive behaviors will not return.   -He will continue off the artane as that made him worse 2.    Peripheral neuropathy, likely secondary to diabetes.  -He is off of Cymbalta and doing well. 3.  Depression  -May need psychiatry input in the future, but seems to be doing okay on the Remeron, which is also helping sleep 4.  EDS  -features worrisome for [facility].  I am awaiting his nocturnal polysomnogram, which they had to reschedule. 5.  Return in about 2 months (around 10/11/2013).

## 2013-08-13 ENCOUNTER — Ambulatory Visit: Payer: Medicare Other | Admitting: Physical Therapy

## 2013-08-13 ENCOUNTER — Ambulatory Visit: Payer: Medicare Other | Admitting: Occupational Therapy

## 2013-08-13 ENCOUNTER — Other Ambulatory Visit: Payer: Self-pay

## 2013-08-13 MED ORDER — ATORVASTATIN CALCIUM 40 MG PO TABS: ORAL_TABLET | ORAL | Status: DC

## 2013-08-25 ENCOUNTER — Telehealth: Payer: Self-pay | Admitting: Neurology

## 2013-08-25 MED ORDER — CARBIDOPA-LEVODOPA ER 23.75-95 MG PO CPCR: 4.0000 | ORAL_CAPSULE | Freq: Three times a day (TID) | ORAL | Status: DC

## 2013-08-25 NOTE — Telephone Encounter (Signed)
Great.  Does he need samples before then

## 2013-08-25 NOTE — Telephone Encounter (Signed)
Prescription sent to pharmacy and prior authorization started for medication. Patient made aware to wait until medication authorized to pick up. I will let them know. He is doing great on medication and they were made aware to take 30 minutes before meals.

## 2013-08-25 NOTE — Telephone Encounter (Signed)
Approval received, patient made aware and approval letter faxed to [facility].

## 2013-08-25 NOTE — Telephone Encounter (Signed)
Added question, does it mater if he takes meds before or after he eats / Careers information officer

## 2013-08-25 NOTE — Telephone Encounter (Signed)
Calling with med update, pt has improved balance. Rytaty, wants script called in. Pharmacy - [facility] in [place] / Sherri S.

## 2013-08-25 NOTE — Telephone Encounter (Signed)
They were okay with samples.

## 2013-08-26 ENCOUNTER — Telehealth: Payer: Self-pay | Admitting: Neurology

## 2013-08-26 NOTE — Telephone Encounter (Signed)
Pt has questions regarding Rytary script. Call back # [phone] / Sherri S.

## 2013-08-27 ENCOUNTER — Ambulatory Visit (HOSPITAL_COMMUNITY): Payer: Medicare Other | Attending: Family Medicine | Admitting: Radiology

## 2013-08-27 DIAGNOSIS — M7989 Other specified soft tissue disorders: Secondary | ICD-10-CM

## 2013-08-27 DIAGNOSIS — I517 Cardiomegaly: Secondary | ICD-10-CM | POA: Diagnosis not present

## 2013-08-27 DIAGNOSIS — I2510 Atherosclerotic heart disease of native coronary artery without angina pectoris: Secondary | ICD-10-CM | POA: Diagnosis present

## 2013-08-27 DIAGNOSIS — R609 Edema, unspecified: Secondary | ICD-10-CM | POA: Insufficient documentation

## 2013-08-27 DIAGNOSIS — E119 Type 2 diabetes mellitus without complications: Secondary | ICD-10-CM | POA: Diagnosis not present

## 2013-08-27 DIAGNOSIS — E785 Hyperlipidemia, unspecified: Secondary | ICD-10-CM | POA: Insufficient documentation

## 2013-08-27 DIAGNOSIS — I059 Rheumatic mitral valve disease, unspecified: Secondary | ICD-10-CM | POA: Insufficient documentation

## 2013-08-27 DIAGNOSIS — Z87891 Personal history of nicotine dependence: Secondary | ICD-10-CM | POA: Diagnosis not present

## 2013-08-27 DIAGNOSIS — I359 Nonrheumatic aortic valve disorder, unspecified: Secondary | ICD-10-CM | POA: Insufficient documentation

## 2013-08-27 DIAGNOSIS — G20 Parkinson's disease: Secondary | ICD-10-CM

## 2013-08-27 NOTE — Progress Notes (Signed)
Echocardiogram performed.  

## 2013-08-27 NOTE — Telephone Encounter (Signed)
Left message on machine for patient to call back.

## 2013-08-31 NOTE — Telephone Encounter (Signed)
Left message again for patient to call back if he needs [place].

## 2013-09-01 ENCOUNTER — Encounter (HOSPITAL_BASED_OUTPATIENT_CLINIC_OR_DEPARTMENT_OTHER): Payer: Medicare Other

## 2013-09-13 ENCOUNTER — Other Ambulatory Visit: Payer: Self-pay | Admitting: Family Medicine

## 2013-09-15 ENCOUNTER — Ambulatory Visit (INDEPENDENT_AMBULATORY_CARE_PROVIDER_SITE_OTHER): Payer: Medicare Other | Admitting: Urology

## 2013-09-15 DIAGNOSIS — N318 Other neuromuscular dysfunction of bladder: Secondary | ICD-10-CM

## 2013-09-15 DIAGNOSIS — N529 Male erectile dysfunction, unspecified: Secondary | ICD-10-CM

## 2013-09-15 DIAGNOSIS — N3941 Urge incontinence: Secondary | ICD-10-CM

## 2013-09-16 ENCOUNTER — Telehealth: Payer: Self-pay | Admitting: Family Medicine

## 2013-09-16 NOTE — Telephone Encounter (Signed)
Message copied by Alyson Locket on Wed Sep 16, 2013  2:41 PM ------      Message from: Jenna Luo      Created: Tue Sep 15, 2013  4:42 PM      Regarding: RE: 2D ECHOCARDIOGRAM        Echo shows stage I diastolic dysfunction  Which means his heart is mildly stiff and does not relax as well to fill with blood easily.  However, this is mild and not clinically significant.  No evidence of heart failure.      ----- Message -----         From: Alyson Locket         Sent: 09/15/2013   3:52 PM           To: Susy Frizzle, MD      Subject: 2D ECHOCARDIOGRAM                                        Pt was calling about results on his 2D ECHOCARDIOGRAM??? Not sure what happened to the report but could you read and I will call his wife back.       ------

## 2013-09-16 NOTE — Telephone Encounter (Signed)
Patients wife aware of results.

## 2013-09-17 ENCOUNTER — Encounter: Payer: Self-pay | Admitting: Cardiology

## 2013-09-27 ENCOUNTER — Ambulatory Visit (HOSPITAL_BASED_OUTPATIENT_CLINIC_OR_DEPARTMENT_OTHER): Payer: Medicare Other | Attending: Neurology | Admitting: Radiology

## 2013-09-27 VITALS — Ht 69.0 in | Wt 235.0 lb

## 2013-09-27 DIAGNOSIS — G4710 Hypersomnia, unspecified: Secondary | ICD-10-CM | POA: Insufficient documentation

## 2013-09-27 DIAGNOSIS — G4733 Obstructive sleep apnea (adult) (pediatric): Secondary | ICD-10-CM

## 2013-09-27 DIAGNOSIS — G4719 Other hypersomnia: Secondary | ICD-10-CM

## 2013-10-01 DIAGNOSIS — G4710 Hypersomnia, unspecified: Secondary | ICD-10-CM

## 2013-10-01 DIAGNOSIS — G4730 Sleep apnea, unspecified: Secondary | ICD-10-CM

## 2013-10-01 NOTE — Sleep Study (Signed)
   NAME: Jeremy Terry DATE OF BIRTH:  27-Jan-1946 MEDICAL RECORD NUMBER [id number]  LOCATION: [facility]  PHYSICIAN: Kathee Delton  DATE OF STUDY: 09/27/2013  SLEEP STUDY TYPE: Nocturnal Polysomnogram               REFERRING PHYSICIAN: Tat, Rebecca S, DO  INDICATION FOR STUDY: Hypersomnia with sleep apnea  EPWORTH SLEEPINESS SCORE:  20 HEIGHT: 5\' 9"  (175.3 cm)  WEIGHT: 235 lb (106.595 kg)    Body mass index is 34.69 kg/(m^2).  NECK SIZE: 17 in.  MEDICATIONS: Reviewed in the sleep record  SLEEP ARCHITECTURE: The patient had a total sleep time of 234 minutes with no slow-wave sleep and only 41 minutes of REM. Sleep onset latency was normal at 18 minutes, and REM onset was at the upper limits of normal. Sleep efficiency was poor at 65%.  RESPIRATORY DATA: The patient had 10 obstructive apneas, and 59 obstructive hypopneas, giving him an AHI of 18 events per hour. The events occurred in all body positions, and there was moderate snoring noted throughout.  OXYGEN DATA: There was oxygen desaturation as low as 84% with the patient's obstructive events  CARDIAC DATA: No clinically significant arrhythmias were noted  MOVEMENT/PARASOMNIA: There were no significant periodic limb movements or other abnormal behavior seen.  IMPRESSION/ RECOMMENDATION:    1) mild to moderate obstructive sleep apnea/hypopnea syndrome, with an AHI of 18 events per hour and transient oxygen desaturation as low as 84%. Treatment for this degree of sleep apnea can include a trial of weight loss alone, upper airway surgery, dental appliance, and also CPAP. Clinical correlation is suggested.     [place], American Board of Sleep Medicine  ELECTRONICALLY SIGNED ON:  10/01/2013, 2:28 PM [address] PH: [phone]   FX: [phone] [place]

## 2013-10-02 ENCOUNTER — Telehealth: Payer: Self-pay | Admitting: Neurology

## 2013-10-02 DIAGNOSIS — G4733 Obstructive sleep apnea (adult) (pediatric): Secondary | ICD-10-CM

## 2013-10-02 NOTE — Telephone Encounter (Signed)
Patient made aware of sleep study results and need for CPAP titration study. Scheduled for December 03, 2013 at 8:00 pm- first available. Patient made aware. He will call with any problems.

## 2013-10-13 ENCOUNTER — Encounter: Payer: Self-pay | Admitting: Neurology

## 2013-10-13 ENCOUNTER — Telehealth: Payer: Self-pay | Admitting: Neurology

## 2013-10-13 ENCOUNTER — Ambulatory Visit (INDEPENDENT_AMBULATORY_CARE_PROVIDER_SITE_OTHER): Payer: Medicare Other | Admitting: Neurology

## 2013-10-13 VITALS — BP 120/64 | HR 60 | Resp 14 | Ht 69.0 in | Wt 236.0 lb

## 2013-10-13 DIAGNOSIS — R269 Unspecified abnormalities of gait and mobility: Secondary | ICD-10-CM

## 2013-10-13 DIAGNOSIS — G20 Parkinson's disease: Secondary | ICD-10-CM

## 2013-10-13 DIAGNOSIS — F329 Major depressive disorder, single episode, unspecified: Secondary | ICD-10-CM

## 2013-10-13 DIAGNOSIS — F3289 Other specified depressive episodes: Secondary | ICD-10-CM

## 2013-10-13 DIAGNOSIS — I2510 Atherosclerotic heart disease of native coronary artery without angina pectoris: Secondary | ICD-10-CM

## 2013-10-13 MED ORDER — BUPROPION HCL ER (XL) 150 MG PO TB24: 150.0000 mg | ORAL_TABLET | Freq: Every day | ORAL | Status: DC

## 2013-10-13 NOTE — Addendum Note (Signed)
Addended byAnnamaria Helling on: 10/13/2013 02:12 PM   Modules accepted: Orders

## 2013-10-13 NOTE — Telephone Encounter (Signed)
Patient's wife made aware. They will contact urology.

## 2013-10-13 NOTE — Progress Notes (Signed)
Jeremy Terry was seen today in the movement disorders clinic for neurologic consultation at the request of [name] [name], MD.  He was previously seen by Dr. [name].  They're requesting a second opinion regarding possible DBS surgery.  He has an appointment with [facility] in this regard as well.  The pt began with L hand tremor that began in 07/2009.  He was started on requip in 06/2010, but after 2 years on the medication, it was tapered off just 2 days ago because of compulsive spending.  When he was initally placed on the medication he had some nausea but his wife did think that it was effective. He has been on levodopa since 09/2011.  This was definitely effective.  He takes 1 1/2 tablets three to four times per day.  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  His wife notices that the medication may wear off about 30 minutes to one hour before the next dosage, as she will notice a resurgence of tremor.  He takes medication at 5 AM, 12 PM, often misses the 3 PM dose and then was taken another at 6 PM.  10/03/12:   The patient presents today with his wife who supplements the history.  They report that he did not get started on Comtan.  His pharmacy told them that it was backordered.  However, when we called the pharmacy and they stated that they had not received the prescription.  The patient did have a fall on July 16 resulting in a wrist fracture.  He was outside and fell over a bucket when he tripped. 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  He is currently taking his carbidopa/levodopa 25/100, 1-1/2 tablets at 8 AM/12 PM/3 M/6 PM.  He cannot tell when the medication wears off.  His wife states that it definitely does help.  The patient did go to therapy.  It was recommended that he use a cane.  He has not been doing that.  His wife recalls that he has been very impulsive.  He  is having major personality change.  He curses a lot and he never did this previously.  He has no hallucinations.  He is having intermittent diplopia.  10/31/12 update:  The pt presents with his wife who supplements the hx.  His Aricept was increased last time to 10 mg daily.  His Requip was d/c.  Is not sure of the levodopa is helping but he continues to take carbidopa/levodopa 25/100, 1-1/2 tablets 4 times per day.  I did discontinue his Cymbalta last visit as he was using it for peripheral neuropathy, but had no symptoms of paresthesias.  He noticed no increase in paresthesias with the discontinuation of Cymbalta.  His mood remains good.  His wife states that his personality has been much better and he is not cursing like he was.  He continues to have some intermittent double vision.  He is exercising some, although not necessarily faithfully.  11/27/12 update:  This patient is accompanied in the office by his spouse who supplements the history.  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  He is currently on Aricept 10 mg daily.  He is on carbidopa/levodopa 25/100, 1-1/2 tablets 4 times per day.  Last visit, I cautiously added Artane, because of his biggest complaint of tremor.  He reports that tremor is markedly better.  He did hold his carbidopa/levodopa at my request prior to coming into the office today.  He really feels no different off of the medication.  His biggest complaint is significant knee pain.  It is preventing him from exercising.  I did review his MRI of the brain that was recently done.  There was ventricular prominence, but I thought that the frontotemporal atrophy was greater than the atrophy posteriorly.  12/29/12 update:  This patient is accompanied in the office by his spouse who supplements the history.  He is on artane and doing well.  Last visit, he did not think that the carbidopa/levodopa 25/100 was helpful and so we stopped it.  He got much  worse after this was stopped (falls, trouble getting out of the bed) and so he went to the PCP on Friday and it was restarted.    He is feeling much better.  His mood is better.  There are no hallucinations.  He does c/o hypophonic speech that is worsening.  02/23/13:  This patient is accompanied in the office by his spouse who supplements the history.  Last visit his carbidopa/levodopa was restarted as he got worse when he was off of the medication.  He takes it at 52 AM/12 PM/3 PM/6 PM.  He is doing well with this and much better now that he is back on the medication.  He is also on aricept 10 mg daily.  He still has spells where he has a significant personality change and is mean to his wife in public, but just as quickly it seems to go away.  They just returned from a cruise and he had a good time.    His only c/o is tremor in the L hand.  He is supposed to be on artane but they don't know what happened to that.  I called the pharmacy and it was last picked up in [place].  He was doing better when on it without significant cognitive change. He is in [place] and doing well with that.  05/25/13 update:  He is accompanied by his wife who supplements the history.  Pt is on levodopa at 8am/12pm/3pm/6pm.  I restarted his artane as he really wanted to retry.  I cautioned them about memory but his wife noted deterioration of memory when he went back on it and she stopped it in Jan.  He has been more moody and depressed.  He has had more balance problems.  He fell on Saturday when he bent over to pick up the dog food dish.  He was supposed to go back to OT but he got a bad sinus infection and then he just got worse and was unmotivated to go back.  If he gets up in the middle of the night, he will sometimes be confused as to how to get the bathroom.  No hallucinations.    07/21/13 update:  Pt presents with wife, who supplements the history.  Is having trouble sleeping at night but is sleeping during the day.  Snores per wife.   Falls asleep watching movie or just when sitting down will doze off.  Still having compulsive eating/buying.  Bought a truck behind wife's back and becoming marial issue.   Having trouble losing weight.  Becoming aggressive with wife at times.  Added lexapro last visit but not helping.   08/11/13 update:  The patient presents today accompanied by his wife who supplements the history.  The patient  has been sleeping better.  The patient reports that he feels somewhat more confused, but his wife disagrees.  The patient did decrease the levodopa and while that compulsive behaviors are better, his balance is much worse.  Overall, the patient feels that he is worse on the lower dose of levodopa, even though his wife is happy that the compulsive behaviors are better.  10/13/13 update:  The patient presents today accompanied by his wife who supplements the history.  Pt is on rytary now, 95 mg - 4 in the AM, 4 in the afternoon and 3 in the evening.  Compulsive behaviors seem better.   Wife states that pt is moody and "mean and angry."  Has had more loss of balance; tells me was just put on a new bladder medication about a month ago and it has really helped.  Gait instability started a month ago.    PREVIOUS MEDICATIONS: Requip (compulsive spending), Cymbalta  ALLERGIES:   Allergies  Allergen Reactions  . Hydrocodone   . Morphine   . Sulfa Antibiotics     CURRENT MEDICATIONS:  Current Outpatient Prescriptions on File Prior to Visit  Medication Sig Dispense Refill  . albuterol (PROVENTIL HFA;VENTOLIN HFA) 108 (90 BASE) MCG/ACT inhaler Inhale 2 puffs into the lungs every 6 (six) hours as needed for wheezing or shortness of breath.  1 Inhaler  0  . Albuterol Sulfate (VENTOLIN HFA IN) Inhale into the lungs. Inhale 2 puffs q 6 hours prn wheezing      . aspirin EC 81 MG tablet Take 81 mg by mouth daily.      . atorvastatin (LIPITOR) 40 MG tablet TAKE ONE TABLET BY MOUTH DAILY  15 tablet  0  . Carbidopa-Levodopa  ER (RYTARY) 23.75-95 MG CPCR Take 4 tablets by mouth 3 (three) times daily. Take 4 tablets in am, 4 tablets in the afternoon, 3 tablets in the evening  330 capsule  5  . clopidogrel (PLAVIX) 75 MG tablet Take 1 tablet (75 mg total) by mouth daily.  30 tablet  12  . donepezil (ARICEPT) 10 MG tablet Take 1 tablet (10 mg total) by mouth at bedtime.  90 tablet  3  . finasteride (PROSCAR) 5 MG tablet TAKE 1 TABLET BY MOUTH EVERY DAY  30 tablet  3  . fluticasone (FLONASE) 50 MCG/ACT nasal spray Place 2 sprays into both nostrils daily.  16 g  6  . furosemide (LASIX) 20 MG tablet Take 1 tablet (20 mg total) by mouth daily.  30 tablet  3  . glipiZIDE (GLUCOTROL) 5 MG tablet TAKE 1 TABLET (5 MG TOTAL) BY MOUTH 2 (TWO) TIMES DAILY BEFORE A MEAL.  60 tablet  0  . glucose blood test strip Use as instructed  100 each  1  . glucose monitoring kit (FREESTYLE) monitoring kit 1 each by Does not apply route as needed for other.  1 each  0  . Lancets 28G MISC 1 Container by Does not apply route daily.  100 each  1  . losartan (COZAAR) 100 MG tablet TAKE 1 TABLET BY MOUTH EVERY DAY  30 tablet  11  . meloxicam (MOBIC) 15 MG tablet TAKE 1 TABLET BY MOUTH EVERY DAY  30 tablet  5  . metoprolol succinate (TOPROL-XL) 50 MG 24 hr tablet Take 1 tablet (50 mg total) by mouth daily.  30 tablet  6  . mirtazapine (REMERON) 15 MG tablet Take 1 tablet (15 mg total) by mouth at bedtime.  30 tablet    5  . Multiple Vitamin (MULTIVITAMIN) capsule Take 1 capsule by mouth daily.        . nitroGLYCERIN (NITROSTAT) 0.4 MG SL tablet Place 1 tablet (0.4 mg total) under the tongue every 5 (five) minutes as needed.  25 tablet  11  . RAPAFLO 8 MG CAPS capsule TAKE 1 CAPSULE (8 MG TOTAL) BY MOUTH DAILY WITH BREAKFAST. FOR PROSTATE  30 capsule  11  . [DISCONTINUED] rOPINIRole (REQUIP) 1 MG tablet Take 1 mg by mouth 3 (three) times daily.        No current facility-administered medications on file prior to visit.    PAST MEDICAL HISTORY:   Past  Medical History  Diagnosis Date  . Coronary artery disease   . Hypertension   . Diabetes mellitus   . Hypercholesteremia   . BPH (benign prostatic hypertrophy)   . DJD (degenerative joint disease)   . Asthma   . Memory loss   . Ventral hernia   . GERD (gastroesophageal reflux disease)   . Allergic rhinitis   . Parkinsonian syndrome   . Hypogonadism male   . Neuropathy of leg     Bilateral  . Depression   . Parkinson disease     PAST SURGICAL HISTORY:   Past Surgical History  Procedure Laterality Date  . Coronary stent placement  2003  . Shoulder surgery  2011    rotator cuff    SOCIAL HISTORY:   History   Social History  . Marital Status: Married    Spouse Name: N/A    Number of Children: 2  . Years of Education: 14   Occupational History  . retired     Psychologist, sport and exercise   Social History Main Topics  . Smoking status: Former Smoker    Quit date: 02/26/1998  . Smokeless tobacco: Never Used  . Alcohol Use: Yes     Comment: Occ  . Drug Use: No  . Sexual Activity: Yes    Partners: Female   Other Topics Concern  . Not on file   Social History Narrative   Pt lives at home with his spouse.   Caffeine Use: Does not consume    FAMILY HISTORY:   Family Status  Relation Status Death Age  . Father Deceased 53    CHF  . Mother Deceased     pneumonia  . Child Alive     2, healthy    ROS:  A complete 10 system review of systems was obtained and was unremarkable apart from what is mentioned above.  PHYSICAL EXAMINATION:    VITALS:   Filed Vitals:   10/13/13 1155  BP: 120/64  Pulse: 60  Resp: 14  Height: 5' 9" (1.753 m)  Weight: 236 lb (107.049 kg)   BP Readings from Last 3 Encounters:  10/13/13 120/64  08/11/13 126/76  08/03/13 130/76    GEN:  The patient appears stated age and is in NAD. HEENT:  Normocephalic, atraumatic.  The mucous membranes are moist. The superficial temporal arteries are without ropiness or tenderness. CV:  RRR Lungs:   CTAB Neck/HEME:  There are no carotid bruits bilaterally.  Neurological examination:  Orientation: A MoCA was performed 08/11/13 and the patient scored 18/30.  A MoCA was performed in 01/2013 and the pt scored 17/30.   Relies on wife for hx.   Cranial nerves: There is good facial symmetry.  There is facial hypomimia.  Pupils are equal round and reactive to light bilaterally. Fundoscopic exam reveals clear margins bilaterally.Trouble  with both upgaze and downgaze.  There are definite square wave jerks.  The visual fields are full to confrontational testing. The speech is fluent and clear.  At times, it becomes quite hypophonic and he has significant difficulty with the guttural sounds.  Soft palate rises symmetrically and there is no tongue deviation. Hearing is intact to conversational tone. Motor: Strength is 5/5 in the bilateral upper and lower extremities.   Shoulder shrug is equal and symmetric.  There is no pronator drift. Deep tendon reflexes: Deep tendon reflexes are 3/4 at the bilateral biceps, triceps, brachioradialis, patella and achilles. Plantar response is upgoing on the left and downgoing on the right.  Movement examination: Tone: There is normal tone in the bilateral upper extremities.  The tone in the lower extremities is normal.  Abnormal movements: There is a mild to moderate left upper extremity resting tremor. There is no tremor in the legs Coordination:  There is minimal decremation with hand opening and closing on the left, but finger taps were good bilaterally.  He has difficulty with heel taps and toe taps bilaterally. Gait and Station: The patient has no difficulty arising out of a deep-seated chair without the use of the hands. The patient's stride length is good today  Labs: I got lab work from his primary care physician dated September 08, 2012; his sodium was 138, potassium 4.4, chloride 96, CO2 26, BUN 18 creatinine 1.00.  Hemoglobin A1c was 6.3.  White blood cells were 9.4,  hemoglobin 16.3, hematocrit 46.7 and platelets 300.  ASSESSMENT/PLAN:  1.  Parkinsonism.  -He does have some atypical features that are worrisome for one of the atypical parkinsonian states.  He is hyperreflexic, has an upgoing toe on the right, has square wave jerks, has diplopia and has had personality changes.  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  -He will continue on the aricept 10 mg daily.    -We will continue Rytary 95 mg, 4 tablets in the morning, 3 tablets in the afternoon, 4 tablets in the evening.  Hopefully, he will have better efficacy, better balance, but that compulsive behaviors will not return.   -He will continue off the artane as that made him worse 2.    Peripheral neuropathy, likely secondary to diabetes.  -He is off of Cymbalta and doing well. 3.  Depression  -start wellbutrin for anger issues.  Resistant to psychiatry input.  Risks, benefits, side effects and alternative therapies were discussed.  The opportunity to ask questions was given and they were answered to the best of my ability.  The patient expressed understanding and willingness to follow the outlined treatment protocols. 4.  EDS with [facility]  -awaiting CPAP titration which apparently couldn't be done until October 5.  Gait instability  -got worse one month ago and although I don't know what bladder med he was recently put on, I suspect that med is the etiology.  Has had multiple falls in last month.  Asked them to call me with name of med but also call urologist.  Suspect needs to be d/c.  Wonder if biofeedback could help?  Has urology f/u appt first week of sept. 6.  No Follow-up on file.

## 2013-10-13 NOTE — Telephone Encounter (Signed)
Mrs. Furness called to make [place] aware patient has been taken off of Finasteride and is now taking Myrbetriq 50 mg daily. Please advise.

## 2013-10-13 NOTE — Telephone Encounter (Signed)
Yes, that can definitely throw off balance.  As I told them, call urology and talk with them.  See if biofeedback would help his bladder issue at all as I am worried all the falls since he started taking that medication are from the med.

## 2013-10-13 NOTE — Patient Instructions (Signed)
1.  Call back with the name of your bladder medicine. 2.  [facility] has scooters.  [phone] 3.  Return to clinic in 3 months.

## 2013-10-28 ENCOUNTER — Other Ambulatory Visit: Payer: Self-pay | Admitting: Family Medicine

## 2013-10-30 ENCOUNTER — Ambulatory Visit (INDEPENDENT_AMBULATORY_CARE_PROVIDER_SITE_OTHER): Payer: Medicare Other | Admitting: Urology

## 2013-10-30 DIAGNOSIS — N3941 Urge incontinence: Secondary | ICD-10-CM

## 2013-11-05 ENCOUNTER — Ambulatory Visit: Payer: Medicare Other

## 2013-11-05 ENCOUNTER — Ambulatory Visit: Payer: Medicare Other | Admitting: Occupational Therapy

## 2013-11-05 ENCOUNTER — Ambulatory Visit: Payer: Medicare Other | Attending: Neurology | Admitting: Physical Therapy

## 2013-11-05 ENCOUNTER — Other Ambulatory Visit: Payer: Self-pay | Admitting: Family Medicine

## 2013-11-05 DIAGNOSIS — G20 Parkinson's disease: Secondary | ICD-10-CM | POA: Insufficient documentation

## 2013-11-05 DIAGNOSIS — R269 Unspecified abnormalities of gait and mobility: Secondary | ICD-10-CM | POA: Insufficient documentation

## 2013-11-05 DIAGNOSIS — Z5189 Encounter for other specified aftercare: Secondary | ICD-10-CM | POA: Insufficient documentation

## 2013-11-05 DIAGNOSIS — G20A1 Parkinson's disease without dyskinesia, without mention of fluctuations: Secondary | ICD-10-CM | POA: Insufficient documentation

## 2013-11-05 DIAGNOSIS — R471 Dysarthria and anarthria: Secondary | ICD-10-CM | POA: Insufficient documentation

## 2013-11-05 NOTE — Telephone Encounter (Signed)
Ok to refill 

## 2013-11-24 ENCOUNTER — Ambulatory Visit (INDEPENDENT_AMBULATORY_CARE_PROVIDER_SITE_OTHER): Payer: Medicare Other | Admitting: Urology

## 2013-11-24 DIAGNOSIS — N3941 Urge incontinence: Secondary | ICD-10-CM

## 2013-11-24 DIAGNOSIS — N318 Other neuromuscular dysfunction of bladder: Secondary | ICD-10-CM

## 2013-12-01 ENCOUNTER — Other Ambulatory Visit: Payer: Self-pay | Admitting: Family Medicine

## 2013-12-03 ENCOUNTER — Ambulatory Visit (HOSPITAL_BASED_OUTPATIENT_CLINIC_OR_DEPARTMENT_OTHER): Payer: Medicare Other | Attending: Internal Medicine

## 2013-12-03 DIAGNOSIS — G4733 Obstructive sleep apnea (adult) (pediatric): Secondary | ICD-10-CM | POA: Insufficient documentation

## 2013-12-03 DIAGNOSIS — G4730 Sleep apnea, unspecified: Secondary | ICD-10-CM | POA: Diagnosis present

## 2013-12-10 DIAGNOSIS — G4733 Obstructive sleep apnea (adult) (pediatric): Secondary | ICD-10-CM

## 2013-12-10 NOTE — Sleep Study (Signed)
   NAME: Jeremy Terry DATE OF BIRTH:  September 03, 1945 MEDICAL RECORD NUMBER [id number]  LOCATION: [facility]  PHYSICIAN: Kathee Delton  DATE OF STUDY: 12/03/2013  SLEEP STUDY TYPE: Nocturnal Polysomnogram               REFERRING PHYSICIAN: Tat, Rebecca S, DO  INDICATION FOR STUDY: Hypersomnia with sleep apnea  EPWORTH SLEEPINESS SCORE:  20 HEIGHT:    WEIGHT:      There is no weight on file to calculate BMI.  NECK SIZE:   in.  MEDICATIONS: Reviewed in the sleep record  SLEEP ARCHITECTURE: The patient had a total sleep time of 245 minutes with no slow-wave sleep and 78 minutes of REM. Sleep onset latency was normal at 21 minutes, and REM onset was mildly delayed at 132 minutes. Sleep efficiency was poor at 59%.  RESPIRATORY DATA: The patient underwent a CPAP titration study where he was fitted with a medium Fischer Paykel Simplus full face mask. He was started on a CPAP pressure of 5 cm of water, and pressure was titrated upwards in order to treat his obstructive events and snoring. He was a very difficult titration because of his events occurred almost exclusively during REM, and the patient had one last REM period at the very of the study. He was found to have an optimal pressure of 12 cm of water, but he did not achieve REM on the final setting.  OXYGEN DATA: There was transient oxygen desaturation as low as 83% with the patient's obstructive events  CARDIAC DATA: No clinically significant arrhythmias were noted  MOVEMENT/PARASOMNIA: No periodic limb movements or other abnormal behaviors were seen.  IMPRESSION/ RECOMMENDATION:    1) CPAP titration study reveals good control of previously documented obstructive sleep apnea with a CPAP pressure of 12 cm of water, and delivered by a medium Fischer Paykel Simplus full face mask. It should be noted the patient did not achieve REM on the final CPAP setting, and his events were clearly associated with REM. Therefore, it is  unclear if this is an adequate therapeutic pressure, but certainly can represent a starting point. I would recommend starting the patient on CPAP with 12 cm of water pressure, and monitoring his clinical response and also his download. His pressure can then be adjusted appropriately. The patient should also be encouraged to work aggressively on weight loss.    Steele, American Board of Sleep Medicine  ELECTRONICALLY SIGNED ON:  12/10/2013, 4:27 PM [employer] PH: [phone]   FX: [phone] [place]

## 2013-12-11 ENCOUNTER — Telehealth: Payer: Self-pay | Admitting: Neurology

## 2013-12-11 DIAGNOSIS — G4733 Obstructive sleep apnea (adult) (pediatric): Secondary | ICD-10-CM

## 2013-12-11 NOTE — Telephone Encounter (Signed)
Patient made aware. Order placed in EPIC for [facility] to start CPAP. Message sent to [facility] in [facility] to contact patient. They will let me know if they need anything else.

## 2013-12-11 NOTE — Telephone Encounter (Signed)
Jeremy Terry, set the patient up with CPAP at 12 cm of H2O with heated humidifier.  Let pt know and contact home co to arrange care.

## 2013-12-15 ENCOUNTER — Ambulatory Visit (INDEPENDENT_AMBULATORY_CARE_PROVIDER_SITE_OTHER): Payer: Medicare Other | Admitting: Family Medicine

## 2013-12-15 ENCOUNTER — Encounter: Payer: Self-pay | Admitting: Family Medicine

## 2013-12-15 VITALS — BP 136/90 | HR 76 | Temp 97.8°F | Resp 22 | Ht 69.0 in | Wt 241.0 lb

## 2013-12-15 DIAGNOSIS — D485 Neoplasm of uncertain behavior of skin: Secondary | ICD-10-CM

## 2013-12-15 DIAGNOSIS — R296 Repeated falls: Secondary | ICD-10-CM

## 2013-12-15 DIAGNOSIS — Z87898 Personal history of other specified conditions: Secondary | ICD-10-CM

## 2013-12-15 DIAGNOSIS — I2510 Atherosclerotic heart disease of native coronary artery without angina pectoris: Secondary | ICD-10-CM

## 2013-12-15 DIAGNOSIS — L089 Local infection of the skin and subcutaneous tissue, unspecified: Secondary | ICD-10-CM

## 2013-12-15 DIAGNOSIS — Z9189 Other specified personal risk factors, not elsewhere classified: Secondary | ICD-10-CM

## 2013-12-15 DIAGNOSIS — L723 Sebaceous cyst: Secondary | ICD-10-CM

## 2013-12-15 DIAGNOSIS — Z23 Encounter for immunization: Secondary | ICD-10-CM

## 2013-12-15 MED ORDER — ZOSTER VACCINE LIVE 19400 UNT/0.65ML ~~LOC~~ SOLR: 0.6500 mL | Freq: Once | SUBCUTANEOUS | Status: DC

## 2013-12-15 MED ORDER — DOXYCYCLINE HYCLATE 100 MG PO TABS: 100.0000 mg | ORAL_TABLET | Freq: Two times a day (BID) | ORAL | Status: DC

## 2013-12-15 NOTE — Progress Notes (Signed)
Subjective:    Patient ID: Jeremy Terry, male    DOB: Mar 14, 1945, 68 y.o.   MRN: [id number]  HPI Patient is a very pleasant 68 year old white male who has an inflamed infected sebaceous cyst on the center of his chest. It is a possibly 3 cm in diameter. It is red hot swollen and tender. Past Medical History  Diagnosis Date  . Coronary artery disease   . Hypertension   . Diabetes mellitus   . Hypercholesteremia   . BPH (benign prostatic hypertrophy)   . DJD (degenerative joint disease)   . Asthma   . Memory loss   . Ventral hernia   . GERD (gastroesophageal reflux disease)   . Allergic rhinitis   . Parkinsonian syndrome   . Hypogonadism male   . Neuropathy of leg     Bilateral  . Depression   . Parkinson disease    Past Surgical History  Procedure Laterality Date  . Coronary stent placement  2003  . Shoulder surgery  2011    rotator cuff   Current Outpatient Prescriptions on File Prior to Visit  Medication Sig Dispense Refill  . albuterol (PROVENTIL HFA;VENTOLIN HFA) 108 (90 BASE) MCG/ACT inhaler Inhale 2 puffs into the lungs every 6 (six) hours as needed for wheezing or shortness of breath.  1 Inhaler  0  . Albuterol Sulfate (VENTOLIN HFA IN) Inhale into the lungs. Inhale 2 puffs q 6 hours prn wheezing      . aspirin EC 81 MG tablet Take 81 mg by mouth daily.      Marland Kitchen atorvastatin (LIPITOR) 40 MG tablet TAKE ONE TABLET BY MOUTH DAILY  15 tablet  0  . buPROPion (WELLBUTRIN XL) 150 MG 24 hr tablet Take 1 tablet (150 mg total) by mouth daily.  30 tablet  11  . Carbidopa-Levodopa ER (RYTARY) 23.75-95 MG CPCR Take 4 tablets by mouth 3 (three) times daily. Take 4 tablets in am, 4 tablets in the afternoon, 3 tablets in the evening  330 capsule  5  . clopidogrel (PLAVIX) 75 MG tablet Take 1 tablet (75 mg total) by mouth daily.  30 tablet  12  . donepezil (ARICEPT) 10 MG tablet Take 1 tablet (10 mg total) by mouth at bedtime.  90 tablet  3  . fluticasone (FLONASE) 50 MCG/ACT nasal  spray Place 2 sprays into both nostrils daily.  16 g  6  . furosemide (LASIX) 20 MG tablet TAKE 1 TABLET (20 MG TOTAL) BY MOUTH DAILY.  30 tablet  3  . glipiZIDE (GLUCOTROL) 5 MG tablet TAKE 1 TABLET (5 MG TOTAL) BY MOUTH 2 (TWO) TIMES DAILY BEFORE A MEAL.  60 tablet  3  . glucose blood test strip Use as instructed  100 each  1  . glucose monitoring kit (FREESTYLE) monitoring kit 1 each by Does not apply route as needed for other.  1 each  0  . Lancets 28G MISC 1 Container by Does not apply route daily.  100 each  1  . losartan (COZAAR) 100 MG tablet TAKE 1 TABLET BY MOUTH EVERY DAY  30 tablet  11  . meloxicam (MOBIC) 15 MG tablet TAKE 1 TABLET BY MOUTH EVERY DAY  30 tablet  5  . meloxicam (MOBIC) 15 MG tablet TAKE 1 TABLET BY MOUTH EVERY DAY  30 tablet  2  . metoprolol succinate (TOPROL-XL) 50 MG 24 hr tablet Take 1 tablet (50 mg total) by mouth daily.  30 tablet  6  .  mirabegron ER (MYRBETRIQ) 50 MG TB24 tablet Take 50 mg by mouth daily.      . mirtazapine (REMERON) 15 MG tablet Take 1 tablet (15 mg total) by mouth at bedtime.  30 tablet  5  . Multiple Vitamin (MULTIVITAMIN) capsule Take 1 capsule by mouth daily.        . nitroGLYCERIN (NITROSTAT) 0.4 MG SL tablet Place 1 tablet (0.4 mg total) under the tongue every 5 (five) minutes as needed.  25 tablet  11  . RAPAFLO 8 MG CAPS capsule TAKE 1 CAPSULE (8 MG TOTAL) BY MOUTH DAILY WITH BREAKFAST. FOR PROSTATE  30 capsule  11  . [DISCONTINUED] rOPINIRole (REQUIP) 1 MG tablet Take 1 mg by mouth 3 (three) times daily.        No current facility-administered medications on file prior to visit.   Allergies  Allergen Reactions  . Hydrocodone   . Morphine   . Sulfa Antibiotics    History   Social History  . Marital Status: Married    Spouse Name: N/A    Number of Children: 2  . Years of Education: 14   Occupational History  . retired     Psychologist, sport and exercise   Social History Main Topics  . Smoking status: Former Smoker    Quit date: 02/26/1998  .  Smokeless tobacco: Never Used  . Alcohol Use: Yes     Comment: Occ  . Drug Use: No  . Sexual Activity: Yes    Partners: Female   Other Topics Concern  . Not on file   Social History Narrative   Pt lives at home with his spouse.   Caffeine Use: Does not consume      Review of Systems  All other systems reviewed and are negative.      Objective:   Physical Exam  Vitals reviewed. Cardiovascular: Normal rate and regular rhythm.   Pulmonary/Chest: Effort normal and breath sounds normal.  Skin: Skin is warm. There is erythema.          Assessment & Plan:  Infected sebaceous cyst  Area was anesthetized with 0.1% lidocaine with epinephrine. A 1 cm vertical incision was made into the inflamed sebaceous cyst. Copious purulent material was expressed. The wound was then packed with 3 inches of iodoform gauze. Wound care was discussed. Begin doxycycline 100 mg by mouth twice a day for 10 days. If the wound has not completely healed in one month, I recommend referral to Gen. surgery for complete surgical excision of the cyst sac.

## 2013-12-15 NOTE — Addendum Note (Signed)
Addended by: Shary Decamp B on: 12/15/2013 12:07 PM   Modules accepted: Orders

## 2013-12-15 NOTE — Addendum Note (Signed)
Addended by: Shary Decamp B on: 12/15/2013 09:22 AM   Modules accepted: Orders

## 2013-12-18 LAB — WOUND CULTURE
Gram Stain: NONE SEEN
Gram Stain: NONE SEEN

## 2013-12-22 ENCOUNTER — Telehealth: Payer: Self-pay | Admitting: Family Medicine

## 2013-12-22 NOTE — Telephone Encounter (Signed)
Patient aware of culture results,

## 2013-12-22 NOTE — Telephone Encounter (Signed)
Pt is returning your call from Friday [phone]

## 2013-12-25 ENCOUNTER — Encounter: Payer: Self-pay | Admitting: Family Medicine

## 2013-12-25 ENCOUNTER — Ambulatory Visit (INDEPENDENT_AMBULATORY_CARE_PROVIDER_SITE_OTHER): Payer: Medicare Other | Admitting: Family Medicine

## 2013-12-25 VITALS — BP 132/88 | HR 72 | Temp 98.0°F | Resp 20 | Ht 69.0 in | Wt 242.0 lb

## 2013-12-25 DIAGNOSIS — I2510 Atherosclerotic heart disease of native coronary artery without angina pectoris: Secondary | ICD-10-CM

## 2013-12-25 DIAGNOSIS — H5461 Unqualified visual loss, right eye, normal vision left eye: Secondary | ICD-10-CM

## 2013-12-25 NOTE — Progress Notes (Signed)
Subjective:    Patient ID: Jeremy Terry, male    DOB: Jan 31, 1946, 68 y.o.   MRN: [id number]  HPI Patient is a very pleasant 68 year old white male who developed sudden onset of right-sided vision loss approximately 1 week ago. The vision loss is isolated to his right eye. It also seems to be isolated to the upper hemisphere.  Patient has no vision loss in his left eye. When he looks at me with just his right eye he is unable to see my face or anything from a neck up. If the patient lifts his head and looks at me through the bottom half of his eye he is able to again see the definition and details of my face. Pupils are equal round and reactive to light. He denies any eye pain. There is no photophobia. He has no headache. Patient denies any sensitivity or pain on his right temple. He has no other symptoms of giant cell arteritis. Past Medical History  Diagnosis Date  . Coronary artery disease   . Hypertension   . Diabetes mellitus   . Hypercholesteremia   . BPH (benign prostatic hypertrophy)   . DJD (degenerative joint disease)   . Asthma   . Memory loss   . Ventral hernia   . GERD (gastroesophageal reflux disease)   . Allergic rhinitis   . Parkinsonian syndrome   . Hypogonadism male   . Neuropathy of leg     Bilateral  . Depression   . Parkinson disease    Past Surgical History  Procedure Laterality Date  . Coronary stent placement  2003  . Shoulder surgery  2011    rotator cuff   Current Outpatient Prescriptions on File Prior to Visit  Medication Sig Dispense Refill  . albuterol (PROVENTIL HFA;VENTOLIN HFA) 108 (90 BASE) MCG/ACT inhaler Inhale 2 puffs into the lungs every 6 (six) hours as needed for wheezing or shortness of breath.  1 Inhaler  0  . Albuterol Sulfate (VENTOLIN HFA IN) Inhale into the lungs. Inhale 2 puffs q 6 hours prn wheezing      . aspirin EC 81 MG tablet Take 81 mg by mouth daily.      Marland Kitchen atorvastatin (LIPITOR) 40 MG tablet TAKE ONE TABLET BY MOUTH DAILY  15  tablet  0  . buPROPion (WELLBUTRIN XL) 150 MG 24 hr tablet Take 1 tablet (150 mg total) by mouth daily.  30 tablet  11  . Carbidopa-Levodopa ER (RYTARY) 23.75-95 MG CPCR Take 4 tablets by mouth 3 (three) times daily. Take 4 tablets in am, 4 tablets in the afternoon, 3 tablets in the evening  330 capsule  5  . clopidogrel (PLAVIX) 75 MG tablet Take 1 tablet (75 mg total) by mouth daily.  30 tablet  12  . donepezil (ARICEPT) 10 MG tablet Take 1 tablet (10 mg total) by mouth at bedtime.  90 tablet  3  . doxycycline (VIBRA-TABS) 100 MG tablet Take 1 tablet (100 mg total) by mouth 2 (two) times daily.  20 tablet  0  . fluticasone (FLONASE) 50 MCG/ACT nasal spray Place 2 sprays into both nostrils daily.  16 g  6  . furosemide (LASIX) 20 MG tablet TAKE 1 TABLET (20 MG TOTAL) BY MOUTH DAILY.  30 tablet  3  . glipiZIDE (GLUCOTROL) 5 MG tablet TAKE 1 TABLET (5 MG TOTAL) BY MOUTH 2 (TWO) TIMES DAILY BEFORE A MEAL.  60 tablet  3  . glucose blood test strip Use as instructed  100 each  1  . glucose monitoring kit (FREESTYLE) monitoring kit 1 each by Does not apply route as needed for other.  1 each  0  . Lancets 28G MISC 1 Container by Does not apply route daily.  100 each  1  . losartan (COZAAR) 100 MG tablet TAKE 1 TABLET BY MOUTH EVERY DAY  30 tablet  11  . meloxicam (MOBIC) 15 MG tablet TAKE 1 TABLET BY MOUTH EVERY DAY  30 tablet  5  . meloxicam (MOBIC) 15 MG tablet TAKE 1 TABLET BY MOUTH EVERY DAY  30 tablet  2  . metoprolol succinate (TOPROL-XL) 50 MG 24 hr tablet Take 1 tablet (50 mg total) by mouth daily.  30 tablet  6  . mirabegron ER (MYRBETRIQ) 50 MG TB24 tablet Take 50 mg by mouth daily.      . mirtazapine (REMERON) 15 MG tablet Take 1 tablet (15 mg total) by mouth at bedtime.  30 tablet  5  . Multiple Vitamin (MULTIVITAMIN) capsule Take 1 capsule by mouth daily.        . nitroGLYCERIN (NITROSTAT) 0.4 MG SL tablet Place 1 tablet (0.4 mg total) under the tongue every 5 (five) minutes as needed.  25  tablet  11  . RAPAFLO 8 MG CAPS capsule TAKE 1 CAPSULE (8 MG TOTAL) BY MOUTH DAILY WITH BREAKFAST. FOR PROSTATE  30 capsule  11  . [DISCONTINUED] rOPINIRole (REQUIP) 1 MG tablet Take 1 mg by mouth 3 (three) times daily.        No current facility-administered medications on file prior to visit.   Allergies  Allergen Reactions  . Hydrocodone   . Morphine   . Sulfa Antibiotics    History   Social History  . Marital Status: Married    Spouse Name: N/A    Number of Children: 2  . Years of Education: 14   Occupational History  . retired     Psychologist, sport and exercise   Social History Main Topics  . Smoking status: Former Smoker    Quit date: 02/26/1998  . Smokeless tobacco: Never Used  . Alcohol Use: Yes     Comment: Occ  . Drug Use: No  . Sexual Activity: Yes    Partners: Female   Other Topics Concern  . Not on file   Social History Narrative   Pt lives at home with his spouse.   Caffeine Use: Does not consume      Review of Systems  All other systems reviewed and are negative.      Objective:   Physical Exam  Vitals reviewed. Constitutional: He is oriented to person, place, and time.  Eyes: Conjunctivae and EOM are normal. Pupils are equal, round, and reactive to light. Right eye exhibits no discharge. Left eye exhibits no discharge. No scleral icterus.  Cardiovascular: Normal rate, regular rhythm and normal heart sounds.   No murmur heard. Pulmonary/Chest: Effort normal and breath sounds normal. No respiratory distress. He has no wheezes. He has no rales.  Abdominal: Soft. Bowel sounds are normal. He exhibits no distension.  Neurological: He is alert and oriented to person, place, and time. He has normal reflexes. He displays normal reflexes. No cranial nerve deficit. He exhibits normal muscle tone. Coordination normal.          Assessment & Plan:  Vision loss of right eye - Plan: Sedimentation rate  Unilateral vision loss suggests an ischemic lesion anterior to the  optic chiasm. I suspect thromboembolic disease to the retina  causing the hemispheric vision loss. I'll arrange for urgent ophthalmology consultation to confirm my suspicion and rule out other possible causes such as retinal vein occlusion, retinal hemorrhage, retinal detachment. If ischemic disease is confirmed, the patient requires a workup including carotid Dopplers, MRI of the brain, and an echocardiogram of the heart to rule out embolic phenomenon. He is already on aspirin and Plavix. I will also check a sedimentation rate to evaluate for possible giant cell arteritis although I feel that this is unlikely because he is otherwise asymptomatic.

## 2013-12-26 LAB — SEDIMENTATION RATE: Sed Rate: 5 mm/hr (ref 0–16)

## 2013-12-29 ENCOUNTER — Ambulatory Visit (INDEPENDENT_AMBULATORY_CARE_PROVIDER_SITE_OTHER): Payer: Medicare Other | Admitting: Neurology

## 2013-12-29 ENCOUNTER — Telehealth: Payer: Self-pay | Admitting: Neurology

## 2013-12-29 ENCOUNTER — Encounter: Payer: Self-pay | Admitting: Family Medicine

## 2013-12-29 VITALS — BP 138/80 | HR 80 | Ht 69.0 in | Wt 240.0 lb

## 2013-12-29 DIAGNOSIS — H47011 Ischemic optic neuropathy, right eye: Secondary | ICD-10-CM

## 2013-12-29 DIAGNOSIS — F330 Major depressive disorder, recurrent, mild: Secondary | ICD-10-CM

## 2013-12-29 DIAGNOSIS — E0841 Diabetes mellitus due to underlying condition with diabetic mononeuropathy: Secondary | ICD-10-CM

## 2013-12-29 DIAGNOSIS — I2510 Atherosclerotic heart disease of native coronary artery without angina pectoris: Secondary | ICD-10-CM

## 2013-12-29 DIAGNOSIS — G20 Parkinson's disease: Secondary | ICD-10-CM

## 2013-12-29 MED ORDER — ASPIRIN-DIPYRIDAMOLE ER 25-200 MG PO CP12: 1.0000 | ORAL_CAPSULE | Freq: Two times a day (BID) | ORAL | Status: DC

## 2013-12-29 NOTE — Progress Notes (Signed)
Jeremy Terry was seen today in the movement disorders clinic for neurologic consultation at the request of [name] [name], MD.  He was previously seen by Dr. [name].  They're requesting a second opinion regarding possible DBS surgery.  He has an appointment with [facility] in this regard as well.  The pt began with L hand tremor that began in 07/2009.  He was started on requip in 06/2010, but after 2 years on the medication, it was tapered off just 2 days ago because of compulsive spending.  When he was initally placed on the medication he had some nausea but his wife did think that it was effective. He has been on levodopa since 09/2011.  This was definitely effective.  He takes 1 1/2 tablets three to four times per day.  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  His wife notices that the medication may wear off about 30 minutes to one hour before the next dosage, as she will notice a resurgence of tremor.  He takes medication at 5 AM, 12 PM, often misses the 3 PM dose and then was taken another at 6 PM.  10/03/12:   The patient presents today with his wife who supplements the history.  They report that he did not get started on Comtan.  His pharmacy told them that it was backordered.  However, when we called the pharmacy and they stated that they had not received the prescription.  The patient did have a fall on July 16 resulting in a wrist fracture.  He was outside and fell over a bucket when he tripped. 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  He is currently taking his carbidopa/levodopa 25/100, 1-1/2 tablets at 8 AM/12 PM/3 M/6 PM.  He cannot tell when the medication wears off.  His wife states that it definitely does help.  The patient did go to therapy.  It was recommended that he use a cane.  He has not been doing that.  His wife recalls that he has been very impulsive.  He  is having major personality change.  He curses a lot and he never did this previously.  He has no hallucinations.  He is having intermittent diplopia.  10/31/12 update:  The pt presents with his wife who supplements the hx.  His Aricept was increased last time to 10 mg daily.  His Requip was d/c.  Is not sure of the levodopa is helping but he continues to take carbidopa/levodopa 25/100, 1-1/2 tablets 4 times per day.  I did discontinue his Cymbalta last visit as he was using it for peripheral neuropathy, but had no symptoms of paresthesias.  He noticed no increase in paresthesias with the discontinuation of Cymbalta.  His mood remains good.  His wife states that his personality has been much better and he is not cursing like he was.  He continues to have some intermittent double vision.  He is exercising some, although not necessarily faithfully.  11/27/12 update:  This patient is accompanied in the office by his spouse who supplements the history.  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  He is currently on Aricept 10 mg daily.  He is on carbidopa/levodopa 25/100, 1-1/2 tablets 4 times per day.  Last visit, I cautiously added Artane, because of his biggest complaint of tremor.  He reports that tremor is markedly better.  He did hold his carbidopa/levodopa at my request prior to coming into the office today.  He really feels no different off of the medication.  His biggest complaint is significant knee pain.  It is preventing him from exercising.  I did review his MRI of the brain that was recently done.  There was ventricular prominence, but I thought that the frontotemporal atrophy was greater than the atrophy posteriorly.  12/29/12 update:  This patient is accompanied in the office by his spouse who supplements the history.  He is on artane and doing well.  Last visit, he did not think that the carbidopa/levodopa 25/100 was helpful and so we stopped it.  He got much  worse after this was stopped (falls, trouble getting out of the bed) and so he went to the PCP on Friday and it was restarted.    He is feeling much better.  His mood is better.  There are no hallucinations.  He does c/o hypophonic speech that is worsening.  02/23/13:  This patient is accompanied in the office by his spouse who supplements the history.  Last visit his carbidopa/levodopa was restarted as he got worse when he was off of the medication.  He takes it at 52 AM/12 PM/3 PM/6 PM.  He is doing well with this and much better now that he is back on the medication.  He is also on aricept 10 mg daily.  He still has spells where he has a significant personality change and is mean to his wife in public, but just as quickly it seems to go away.  They just returned from a cruise and he had a good time.    His only c/o is tremor in the L hand.  He is supposed to be on artane but they don't know what happened to that.  I called the pharmacy and it was last picked up in [place].  He was doing better when on it without significant cognitive change. He is in [place] and doing well with that.  05/25/13 update:  He is accompanied by his wife who supplements the history.  Pt is on levodopa at 8am/12pm/3pm/6pm.  I restarted his artane as he really wanted to retry.  I cautioned them about memory but his wife noted deterioration of memory when he went back on it and she stopped it in Jan.  He has been more moody and depressed.  He has had more balance problems.  He fell on Saturday when he bent over to pick up the dog food dish.  He was supposed to go back to OT but he got a bad sinus infection and then he just got worse and was unmotivated to go back.  If he gets up in the middle of the night, he will sometimes be confused as to how to get the bathroom.  No hallucinations.    07/21/13 update:  Pt presents with wife, who supplements the history.  Is having trouble sleeping at night but is sleeping during the day.  Snores per wife.   Falls asleep watching movie or just when sitting down will doze off.  Still having compulsive eating/buying.  Bought a truck behind wife's back and becoming marial issue.   Having trouble losing weight.  Becoming aggressive with wife at times.  Added lexapro last visit but not helping.   08/11/13 update:  The patient presents today accompanied by his wife who supplements the history.  The patient  has been sleeping better.  The patient reports that he feels somewhat more confused, but his wife disagrees.  The patient did decrease the levodopa and while that compulsive behaviors are better, his balance is much worse.  Overall, the patient feels that he is worse on the lower dose of levodopa, even though his wife is happy that the compulsive behaviors are better.  10/13/13 update:  The patient presents today accompanied by his wife who supplements the history.  Pt is on rytary now, 95 mg - 4 in the AM, 4 in the afternoon and 3 in the evening.  Compulsive behaviors seem better.   Wife states that pt is moody and "mean and angry."  Has had more loss of balance; tells me was just put on a new bladder medication about a month ago and it has really helped.  Gait instability started a month ago.    12/29/13 update:  This patient is accompanied in the office by his spouse who supplements the history.  Pt on rytary $RemoveB'95mg'radBMxnp$  - 4 in the AM, 4 in the afternoon and 3 in the evening.   They don't think that it has been as effective as in the past and it has been very costly.  Pt c/o more difficulty with coordination but thinks that balance is a little better.  States that steps are smaller and drags R leg.  Dragging leg more for 2 months.  Memory has been "on and off."  Wellbutrin helped greatly with mood - wife states that is 75% better.  Asks about DBS surgery.  Last Thursday, pt was watching TV and noted acute onset of vision change (blurry per pt) on the right side.  He went to bed and it was the same way the next day.  He had  an appt the next day with a dr as he was in a research trial for cholesterol and he told him he thought had a stroke.  He f/u with Dr. Dennard Schaumann.  He saw Dr. Katy Fitch as well and was told that he had a little stroke behind his retina.  He has his carotid u/s tomorrow.  He has an MRI scheduled for thurs night.  He still cannot see anything in the upper quadrant out of the right eye but can see in the lower quandrant but can see out of the left eye.  He was on asa and plavix at the time of the event as well as lipitor.  He was previously on plavix for his cardiac stent.   PREVIOUS MEDICATIONS: Requip (compulsive spending), Cymbalta  ALLERGIES:   Allergies  Allergen Reactions  . Hydrocodone   . Morphine   . Sulfa Antibiotics     CURRENT MEDICATIONS:  Current Outpatient Prescriptions on File Prior to Visit  Medication Sig Dispense Refill  . albuterol (PROVENTIL HFA;VENTOLIN HFA) 108 (90 BASE) MCG/ACT inhaler Inhale 2 puffs into the lungs every 6 (six) hours as needed for wheezing or shortness of breath. 1 Inhaler 0  . Albuterol Sulfate (VENTOLIN HFA IN) Inhale into the lungs. Inhale 2 puffs q 6 hours prn wheezing    . aspirin EC 81 MG tablet Take 81 mg by mouth daily.    Marland Kitchen atorvastatin (LIPITOR) 40 MG tablet TAKE ONE TABLET BY MOUTH DAILY 15 tablet 0  . buPROPion (WELLBUTRIN XL) 150 MG 24 hr tablet Take 1 tablet (150 mg total) by mouth daily. 30 tablet 11  . Carbidopa-Levodopa ER (RYTARY) 23.75-95 MG CPCR Take 4 tablets by mouth 3 (  three) times daily. Take 4 tablets in am, 4 tablets in the afternoon, 3 tablets in the evening 330 capsule 5  . clopidogrel (PLAVIX) 75 MG tablet Take 1 tablet (75 mg total) by mouth daily. 30 tablet 12  . donepezil (ARICEPT) 10 MG tablet Take 1 tablet (10 mg total) by mouth at bedtime. 90 tablet 3  . furosemide (LASIX) 20 MG tablet TAKE 1 TABLET (20 MG TOTAL) BY MOUTH DAILY. 30 tablet 3  . glipiZIDE (GLUCOTROL) 5 MG tablet TAKE 1 TABLET (5 MG TOTAL) BY MOUTH 2 (TWO) TIMES  DAILY BEFORE A MEAL. 60 tablet 3  . glucose blood test strip Use as instructed 100 each 1  . losartan (COZAAR) 100 MG tablet TAKE 1 TABLET BY MOUTH EVERY DAY 30 tablet 11  . meloxicam (MOBIC) 15 MG tablet TAKE 1 TABLET BY MOUTH EVERY DAY 30 tablet 2  . metoprolol succinate (TOPROL-XL) 50 MG 24 hr tablet Take 1 tablet (50 mg total) by mouth daily. 30 tablet 6  . mirtazapine (REMERON) 15 MG tablet Take 1 tablet (15 mg total) by mouth at bedtime. 30 tablet 5  . Multiple Vitamin (MULTIVITAMIN) capsule Take 1 capsule by mouth daily.      Marland Kitchen RAPAFLO 8 MG CAPS capsule TAKE 1 CAPSULE (8 MG TOTAL) BY MOUTH DAILY WITH BREAKFAST. FOR PROSTATE 30 capsule 11  . glucose monitoring kit (FREESTYLE) monitoring kit 1 each by Does not apply route as needed for other. 1 each 0  . Lancets 28G MISC 1 Container by Does not apply route daily. 100 each 1  . nitroGLYCERIN (NITROSTAT) 0.4 MG SL tablet Place 1 tablet (0.4 mg total) under the tongue every 5 (five) minutes as needed. 25 tablet 11  . [DISCONTINUED] rOPINIRole (REQUIP) 1 MG tablet Take 1 mg by mouth 3 (three) times daily.      No current facility-administered medications on file prior to visit.    PAST MEDICAL HISTORY:   Past Medical History  Diagnosis Date  . Coronary artery disease   . Hypertension   . Diabetes mellitus   . Hypercholesteremia   . BPH (benign prostatic hypertrophy)   . DJD (degenerative joint disease)   . Asthma   . Memory loss   . Ventral hernia   . GERD (gastroesophageal reflux disease)   . Allergic rhinitis   . Parkinsonian syndrome   . Hypogonadism male   . Neuropathy of leg     Bilateral  . Depression   . Parkinson disease   . NAION (non-arteritic anterior ischemic optic neuropathy), right eye     Followed by Dr. Katy Fitch    PAST SURGICAL HISTORY:   Past Surgical History  Procedure Laterality Date  . Coronary stent placement  2003  . Shoulder surgery  2011    rotator cuff    SOCIAL HISTORY:   History   Social  History  . Marital Status: Married    Spouse Name: N/A    Number of Children: 2  . Years of Education: 14   Occupational History  . retired     Psychologist, sport and exercise   Social History Main Topics  . Smoking status: Former Smoker    Quit date: 02/26/1998  . Smokeless tobacco: Never Used  . Alcohol Use: Yes     Comment: Occ  . Drug Use: No  . Sexual Activity:    Partners: Female   Other Topics Concern  . Not on file   Social History Narrative   Pt lives at home with  his spouse.   Caffeine Use: Does not consume    FAMILY HISTORY:   Family Status  Relation Status Death Age  . Father Deceased 62    CHF  . Mother Deceased     pneumonia  . Child Alive     2, healthy    ROS:  A complete 10 system review of systems was obtained and was unremarkable apart from what is mentioned above.  PHYSICAL EXAMINATION:    VITALS:   Filed Vitals:   12/29/13 1054  BP: 138/80  Pulse: 80  Height: 5\' 9"  (1.753 m)  Weight: 240 lb (108.863 kg)   BP Readings from Last 3 Encounters:  12/29/13 138/80  12/25/13 132/88  12/15/13 136/90    GEN:  The patient appears stated age and is in NAD. HEENT:  Normocephalic, atraumatic.  The mucous membranes are moist. The superficial temporal arteries are without ropiness or tenderness. CV:  RRR Lungs:  CTAB Neck/HEME:  There are no carotid bruits bilaterally.  Neurological examination:  Orientation: A MoCA was performed 08/11/13 and the patient scored 18/30.  A MoCA was performed in 01/2013 and the pt scored 17/30.   Relies on wife for hx.   Cranial nerves: There is good facial symmetry.  There is facial hypomimia.  Pupils are equal round and reactive to light bilaterally. Fundoscopic exam reveals clear margins bilaterally.Trouble with both upgaze and downgaze.  There are definite square wave jerks.  The visual fields are full to confrontational testing. The speech is fluent and clear.  At times, it becomes quite hypophonic and he has significant difficulty with  the guttural sounds.  Soft palate rises symmetrically and there is no tongue deviation. Hearing is intact to conversational tone. Motor: Strength is 5/5 in the bilateral upper and lower extremities.   Shoulder shrug is equal and symmetric.  There is no pronator drift. Deep tendon reflexes: Deep tendon reflexes are 3/4 at the bilateral biceps, triceps, brachioradialis, patella and achilles. Plantar response is upgoing on the left and downgoing on the right.  Movement examination: Tone: There is normal tone in the bilateral upper extremities.  The tone in the lower extremities is normal.  Abnormal movements: There is a mild to moderate left upper extremity resting tremor. There is no tremor in the legs Coordination:  There is minimal decremation with hand opening and closing on the left, but finger taps were good bilaterally.  He has difficulty with heel taps and toe taps bilaterally. Gait and Station: The patient has no difficulty arising out of a deep-seated chair without the use of the hands. The patient's stride length is good today but he is dragging the R leg  Labs: I got lab work from his primary care physician dated September 08, 2012; his sodium was 138, potassium 4.4, chloride 96, CO2 26, BUN 18 creatinine 1.00.  Hemoglobin A1c was 6.3.  White blood cells were 9.4, hemoglobin 16.3, hematocrit 46.7 and platelets 300.  ASSESSMENT/PLAN:  1.  Parkinsonism.  -He does have some atypical features that are worrisome for one of the atypical parkinsonian states.  He is hyperreflexic, has an upgoing toe on the right, has square wave jerks, has diplopia and has had personality changes.  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  -He will continue on the aricept 10 mg daily.    -We will continue Rytary 95 mg, 4 tablets in the morning, 3 tablets in the  afternoon, 4 tablets in the evening.  Need to contact company to see if they  qualify for pt assist.  Information given to pt/wife  -He will continue off the artane as that made him worse  -the patient asked me about DBS therapy.  He really wanted to proceed.  I told him that I had serious reservations, as I did not think that he had idiopathic Parkinson's disease.  Despite that, the patient was very frustrated and still wanted to proceed with options.  As a compromise, I told him that we could proceed with neuro psych testing, as I was not convinced that he would be a candidate just because of that, and he was agreeable to proceed with that.  Will send a referral to Dr. Conley Canal.  -new referral to PT as dragging R leg but didn't want to change meds as no longer having rigidity or other bradykinesia. 2.    Peripheral neuropathy, likely secondary to diabetes.  -He is off of Cymbalta and doing well. 3.  Depression  -doing better with anger with wellbutrin.  Understand potential interaction with cymbalta but doing so much better that doesn't wish to change. Risks, benefits, side effects and alternative therapies were discussed.  The opportunity to ask questions was given and they were answered to the best of my ability.  The patient expressed understanding and willingness to follow the outlined treatment protocols. 4.  EDS with [facility]  -supposed to get his CPAP tomorrow 5.  Anterior ischemic optic neuropathy  -needs carotid/echo.  They are pending.  MRI for Thursday is pending.  Hopefully, no embolic source.  Would like to switch his ASA/Plavix to Aggrenox, but he is on Plavix for previous cardiac stent.  I emailed his cardiologist, Dr. Johnsie Cancel, to see if this is reasonable change or not.  Spent greater than 50% in counseling discussing s/s of stroke and importance of calling 911 in future.  Visit time 45 min. 6.  Return in about 3 months (around 03/31/2014).

## 2013-12-29 NOTE — Telephone Encounter (Signed)
-----   Message from Dickinson, DO sent at 12/29/2013  1:03 PM EST ----- Luvenia Starch,  Talked to Dr. Johnsie Cancel and he said okay to d/c plavix and asa.  Add aggrenox 25/200 - 1 po bid.  Tell pt that this drug can cause headache when first started but should go away ----- Message -----    From: Josue Hector, MD    Sent: 12/29/2013  12:35 PM      To: Richmond Campbell, LPN, Eustace Quail Tat, DO  Changing to aggrenox is fine and agree with carotids  I have not seen him in 2 years will arrange f/u  Christine:  Needs f/u with flex PA or me after carotid.  May need loop recorder  ----- Message -----    From: Ludwig Clarks, DO    Sent: 12/29/2013  11:17 AM      To: Josue Hector, MD  This patient saw me today.  He has parkinsonism.  He just had an episode of amarosis fugax and no longer has vision out of the right lower quadrant of the right eye.  This just happened Thursday.  He saw Dr. Katy Fitch but I don't have the notes yet; he was just told that it was permanent.  I suspect that he had an opthalmic a infarct but don't know yet.  He has an MRI scheduled for Thursday and a carotid u/s scheduled for tomorrow.  He was on plavix for his cardiac stent when this happened.  I wanted to change to aggrenox but wasn't sure how this would affect you/your treatment.  Thank you, as always, for your input.  Appreciate your help!  Wells Guiles

## 2013-12-29 NOTE — Addendum Note (Signed)
Addended byAnnamaria Helling on: 12/29/2013 01:35 PM   Modules accepted: Orders, Medications

## 2013-12-29 NOTE — Telephone Encounter (Signed)
LMOM for patient to call back. To make him aware of medication change. Awaiting call.

## 2013-12-29 NOTE — Patient Instructions (Signed)
Referral to neuropsych testing. Referral to neuro-rehab.  PT. Rytary assistance program. Return in 3 months.

## 2013-12-29 NOTE — Telephone Encounter (Signed)
Patient's wife made aware to stop ASA and Plavix and start Aggrenox BID. Aware this could cause a headache for a couple days but should get better. They will call if needed.

## 2013-12-30 ENCOUNTER — Ambulatory Visit (HOSPITAL_BASED_OUTPATIENT_CLINIC_OR_DEPARTMENT_OTHER): Payer: Medicare Other | Admitting: Cardiology

## 2013-12-30 ENCOUNTER — Ambulatory Visit (HOSPITAL_COMMUNITY): Payer: Medicare Other | Attending: Cardiovascular Disease | Admitting: *Deleted

## 2013-12-30 DIAGNOSIS — I10 Essential (primary) hypertension: Secondary | ICD-10-CM | POA: Insufficient documentation

## 2013-12-30 DIAGNOSIS — I6523 Occlusion and stenosis of bilateral carotid arteries: Secondary | ICD-10-CM

## 2013-12-30 DIAGNOSIS — I2510 Atherosclerotic heart disease of native coronary artery without angina pectoris: Secondary | ICD-10-CM | POA: Insufficient documentation

## 2013-12-30 DIAGNOSIS — H538 Other visual disturbances: Secondary | ICD-10-CM | POA: Diagnosis not present

## 2013-12-30 DIAGNOSIS — E785 Hyperlipidemia, unspecified: Secondary | ICD-10-CM | POA: Diagnosis not present

## 2013-12-30 DIAGNOSIS — E119 Type 2 diabetes mellitus without complications: Secondary | ICD-10-CM | POA: Insufficient documentation

## 2013-12-30 DIAGNOSIS — I639 Cerebral infarction, unspecified: Secondary | ICD-10-CM

## 2013-12-30 DIAGNOSIS — H5461 Unqualified visual loss, right eye, normal vision left eye: Secondary | ICD-10-CM

## 2013-12-30 DIAGNOSIS — Z87891 Personal history of nicotine dependence: Secondary | ICD-10-CM | POA: Insufficient documentation

## 2013-12-30 NOTE — Progress Notes (Signed)
Echo performed. 

## 2013-12-30 NOTE — Progress Notes (Signed)
Carotid duplex complete 

## 2013-12-31 ENCOUNTER — Ambulatory Visit
Admission: RE | Admit: 2013-12-31 | Discharge: 2013-12-31 | Disposition: A | Discharge status: Home / Self Care | Payer: Medicare Other | Source: Ambulatory Visit | Attending: Family Medicine | Admitting: Family Medicine

## 2013-12-31 DIAGNOSIS — H5461 Unqualified visual loss, right eye, normal vision left eye: Secondary | ICD-10-CM

## 2013-12-31 MED ORDER — GADOBENATE DIMEGLUMINE 529 MG/ML IV SOLN
13.0000 mL | Freq: Once | INTRAVENOUS | Status: AC | PRN
  Administered 2013-12-31: 13 mL via INTRAVENOUS

## 2014-01-02 ENCOUNTER — Other Ambulatory Visit: Payer: Self-pay | Admitting: Family Medicine

## 2014-01-02 ENCOUNTER — Other Ambulatory Visit: Payer: Self-pay | Admitting: Neurology

## 2014-01-04 NOTE — Telephone Encounter (Signed)
Okay to fill? 

## 2014-01-05 ENCOUNTER — Ambulatory Visit (INDEPENDENT_AMBULATORY_CARE_PROVIDER_SITE_OTHER): Payer: Medicare Other | Admitting: Physician Assistant

## 2014-01-05 ENCOUNTER — Encounter: Payer: Self-pay | Admitting: Physician Assistant

## 2014-01-05 ENCOUNTER — Telehealth: Payer: Self-pay | Admitting: *Deleted

## 2014-01-05 VITALS — BP 122/66 | HR 88 | Temp 98.3°F | Resp 18 | Wt 244.0 lb

## 2014-01-05 DIAGNOSIS — H00016 Hordeolum externum left eye, unspecified eyelid: Secondary | ICD-10-CM

## 2014-01-05 DIAGNOSIS — I2510 Atherosclerotic heart disease of native coronary artery without angina pectoris: Secondary | ICD-10-CM

## 2014-01-05 MED ORDER — BACITRACIN-POLYMYXIN B 500-10000 UNIT/GM OP OINT: TOPICAL_OINTMENT | OPHTHALMIC | Status: DC

## 2014-01-05 NOTE — Telephone Encounter (Signed)
-----   Message from Josue Hector, MD sent at 12/29/2013 12:35 PM EST ----- Changing to aggrenox is fine and agree with carotids  I have not seen him in 2 years will arrange f/u  Dashun Borre:  Needs f/u with flex PA or me after carotid.  May need loop recorder  ----- Message -----    From: Ludwig Clarks, DO    Sent: 12/29/2013  11:17 AM      To: Josue Hector, MD  This patient saw me today.  He has parkinsonism.  He just had an episode of amarosis fugax and no longer has vision out of the right lower quadrant of the right eye.  This just happened Thursday.  He saw Dr. Katy Fitch but I don't have the notes yet; he was just told that it was permanent.  I suspect that he had an opthalmic a infarct but don't know yet.  He has an MRI scheduled for Thursday and a carotid u/s scheduled for tomorrow.  He was on plavix for his cardiac stent when this happened.  I wanted to change to aggrenox but wasn't sure how this would affect you/your treatment.  Thank you, as always, for your input.  Appreciate your help!  Wells Guiles

## 2014-01-05 NOTE — Progress Notes (Signed)
Patient ID: Jeremy Terry MRN: [id number], DOB: 02-19-1946, 68 y.o. Date of Encounter: 01/05/2014, 4:50 PM    Chief Complaint:  Chief Complaint  Patient presents with  . stye on left eye    just came up today     HPI: 68 y.o. year old white male here with his wife for office visit.  They report that neither one of them had noticed this bump on his eye until wife just noticed it a couple of hours ago. Patient had not felt any kind of bump there and had not noticed seeing it either.  No other complaints. He has not noticed any new vision changes today. Had no mucus from the eye and no crusting of the eyelashes this morning.     Home Meds:   Outpatient Prescriptions Prior to Visit  Medication Sig Dispense Refill  . albuterol (PROVENTIL HFA;VENTOLIN HFA) 108 (90 BASE) MCG/ACT inhaler Inhale 2 puffs into the lungs every 6 (six) hours as needed for wheezing or shortness of breath. 1 Inhaler 0  . Albuterol Sulfate (VENTOLIN HFA IN) Inhale into the lungs. Inhale 2 puffs q 6 hours prn wheezing    . atorvastatin (LIPITOR) 40 MG tablet TAKE ONE TABLET BY MOUTH DAILY 15 tablet 0  . buPROPion (WELLBUTRIN XL) 150 MG 24 hr tablet Take 1 tablet (150 mg total) by mouth daily. 30 tablet 11  . Carbidopa-Levodopa ER (RYTARY) 23.75-95 MG CPCR Take 4 tablets by mouth 3 (three) times daily. Take 4 tablets in am, 4 tablets in the afternoon, 3 tablets in the evening 330 capsule 5  . clopidogrel (PLAVIX) 75 MG tablet TAKE 1 TABLET (75 MG TOTAL) BY MOUTH DAILY. 30 tablet 12  . dipyridamole-aspirin (AGGRENOX) 200-25 MG per 12 hr capsule Take 1 capsule by mouth 2 (two) times daily. 60 capsule 5  . donepezil (ARICEPT) 10 MG tablet Take 1 tablet (10 mg total) by mouth at bedtime. 90 tablet 3  . furosemide (LASIX) 20 MG tablet TAKE 1 TABLET (20 MG TOTAL) BY MOUTH DAILY. 30 tablet 3  . glipiZIDE (GLUCOTROL) 5 MG tablet TAKE 1 TABLET (5 MG TOTAL) BY MOUTH 2 (TWO) TIMES DAILY BEFORE A MEAL. 60 tablet 3  .  glucose blood test strip Use as instructed 100 each 1  . glucose monitoring kit (FREESTYLE) monitoring kit 1 each by Does not apply route as needed for other. 1 each 0  . Lancets 28G MISC 1 Container by Does not apply route daily. 100 each 1  . losartan (COZAAR) 100 MG tablet TAKE 1 TABLET BY MOUTH EVERY DAY 30 tablet 11  . meloxicam (MOBIC) 15 MG tablet TAKE 1 TABLET BY MOUTH EVERY DAY 30 tablet 2  . metoprolol succinate (TOPROL-XL) 50 MG 24 hr tablet Take 1 tablet (50 mg total) by mouth daily. 30 tablet 6  . mirtazapine (REMERON) 15 MG tablet TAKE 1 TABLET BY MOUTH AT BEDTIME 30 tablet 5  . Multiple Vitamin (MULTIVITAMIN) capsule Take 1 capsule by mouth daily.      . nitroGLYCERIN (NITROSTAT) 0.4 MG SL tablet Place 1 tablet (0.4 mg total) under the tongue every 5 (five) minutes as needed. 25 tablet 11  . oxybutynin (DITROPAN) 5 MG tablet Take 2.5 mg by mouth 3 (three) times daily.     Marland Kitchen RAPAFLO 8 MG CAPS capsule TAKE 1 CAPSULE (8 MG TOTAL) BY MOUTH DAILY WITH BREAKFAST. FOR PROSTATE 30 capsule 11   No facility-administered medications prior to visit.    Allergies:  Allergies  Allergen Reactions  . Hydrocodone   . Morphine   . Sulfa Antibiotics       Review of Systems: See HPI for pertinent ROS. All other ROS negative.    Physical Exam: Blood pressure 122/66, pulse 88, temperature 98.3 F (36.8 C), temperature source Oral, resp. rate 18, weight 244 lb (110.678 kg)., Body mass index is 36.02 kg/(m^2). General:  WM. Appears in no acute distress. HEENT: Normocephalic, atraumatic, eyes without discharge, sclera non-icteric, Right Eye: Appears normal. Left Eye; Medial aspect of lower eyelid---1/2 cm pink papule. No other lesions present. Conjunctiva appears normal.  Neck: Supple. No thyromegaly. No lymphadenopathy. Lungs: Clear bilaterally to auscultation without wheezes, rales, or rhonchi. Breathing is unlabored. Heart: Regular rhythm. No murmurs, rubs, or gallops. Msk:  Strength  and tone normal for age. Extremities/Skin: Warm and dry.  No rashes. Neuro: Alert and oriented X 3. Moves all extremities spontaneously. Gait is normal. CNII-XII grossly in tact. Psych:  Responds to questions appropriately with a normal affect.     ASSESSMENT AND PLAN:  68 y.o. year old male with  1. Hordeolum externum, left Recommended that they purchase baby shampoo. Apply warm compress. Apply baby shampoo and then rinse. Once the skin is dry then apply the Polysporin ophthalmic ointment. Repeat this process every 3 hours while awake. Follow up if site worsens or if it is not resolving in 1 week. - bacitracin-polymyxin b (POLYSPORIN) ophthalmic ointment; apply to eye every 3 hours while awake  Dispense: 3.5 g; Refill: 0  (He has Sulfa allergy--therefore Bleph 10 not used)  Signed, [address] [place], [place], [facility] 01/05/2014 4:50 PM

## 2014-01-05 NOTE — Telephone Encounter (Signed)
LMTCB   PT  IS OVERDUE  FOR AN APPT .Jeremy Terry

## 2014-01-07 NOTE — Telephone Encounter (Signed)
Follow up ° ° ° ° °Returning a nurses call °

## 2014-01-07 NOTE — Telephone Encounter (Signed)
Pt is aware that he has not seen Dr. Johnsie Cancel for 2 years, and that he needs to make an appointment with him or PA/NP after he had the carotid duplex. Pt states that he has had the carotid duplex already; and  his wife will call the office to make the appointment.

## 2014-01-12 ENCOUNTER — Ambulatory Visit: Payer: Medicare Other | Admitting: Neurology

## 2014-01-12 ENCOUNTER — Other Ambulatory Visit: Payer: Self-pay | Admitting: Family Medicine

## 2014-01-12 DIAGNOSIS — E58 Dietary calcium deficiency: Secondary | ICD-10-CM

## 2014-01-19 ENCOUNTER — Other Ambulatory Visit: Payer: Medicare Other

## 2014-01-19 NOTE — Telephone Encounter (Signed)
PT HAS APPT  WITH  DR Johnsie Cancel  AT   9:00 AM .Adonis Housekeeper

## 2014-01-28 ENCOUNTER — Ambulatory Visit
Admission: RE | Admit: 2014-01-28 | Discharge: 2014-01-28 | Disposition: A | Discharge status: Home / Self Care | Payer: Medicare Other | Source: Ambulatory Visit | Attending: Family Medicine | Admitting: Family Medicine

## 2014-01-28 DIAGNOSIS — E58 Dietary calcium deficiency: Secondary | ICD-10-CM

## 2014-01-29 ENCOUNTER — Encounter: Payer: Self-pay | Admitting: Physical Therapy

## 2014-01-29 ENCOUNTER — Ambulatory Visit: Payer: Medicare Other | Attending: Neurology | Admitting: Physical Therapy

## 2014-01-29 DIAGNOSIS — R293 Abnormal posture: Secondary | ICD-10-CM | POA: Diagnosis present

## 2014-01-29 DIAGNOSIS — R269 Unspecified abnormalities of gait and mobility: Secondary | ICD-10-CM | POA: Diagnosis not present

## 2014-01-29 NOTE — Therapy (Signed)
[facility] [address] [place], [place], [address] Phone: [phone]   Fax:  [phone]  Physical Therapy Evaluation  Patient Details  Name: Jeremy Terry MRN: [id number] Date of Birth: [birth year]  Encounter Date: 01/29/2014      PT End of Session - 01/29/14 1205    Visit Number 1  G1   Number of Visits 17   Date for PT Re-Evaluation 03/31/14   PT Start Time 0942   PT Stop Time 1016   PT Time Calculation (min) 34 min      Past Medical History  Diagnosis Date  . Coronary artery disease   . Hypertension   . Diabetes mellitus   . Hypercholesteremia   . BPH (benign prostatic hypertrophy)   . DJD (degenerative joint disease)   . Asthma   . Memory loss   . Ventral hernia   . GERD (gastroesophageal reflux disease)   . Allergic rhinitis   . Parkinsonian syndrome   . Hypogonadism male   . Neuropathy of leg     Bilateral  . Depression   . Parkinson disease   . NAION (non-arteritic anterior ischemic optic neuropathy), right eye     Followed by Dr. Katy Fitch    Past Surgical History  Procedure Laterality Date  . Coronary stent placement  2003  . Shoulder surgery  2011    rotator cuff    There were no vitals taken for this visit.  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  Pt feels that his balance has decreased with the loss of vision R eye.  He has history of Parkinson's disease, and wife reports at least 1-2 falls per week in the past 6 months.  Wife reports he tends to lose balance backwards.  He uses cane or walker with gait.   Patient Stated Goals improve coordination and balance.   Currently in Pain? No/denies          [facility] PT  Assessment - 01/29/14 1156    Assessment   Medical Diagnosis Parkinson's disease; non-arteritic anterior ischemic optic neuropathy, R eye   Onset Date 01/03/14   Precautions   Precautions Fall   Balance Screen   Has the patient fallen in the past 6 months Yes   How many times? --  1-2 falls per week, per wife report   Has the patient had a decrease in activity level because of a fear of falling?  No   Is the patient reluctant to leave their home because of a fear of falling?  No   Home Environment   Living Enviornment Private residence   Living Arrangements Spouse/significant other   Available Help at Discharge Family   Type of [place] or work area in basement  has stair lift for stairs to basement   Additional Comments Lives mainly on main level   Prior Function   Level of Independence Needs assistance with ADLs;Needs assistance with gait;Needs assistance with transfers   Posture/Postural Control   Posture/Postural Control Postural limitations   Postural Limitations Rounded Shoulders;Forward head;Flexed trunk   Posture Comments Hastening with gait with increased forward flexed posture  Strength   Right Hip Flexion 4/5   Left Hip Flexion 4/5   Right Knee Flexion 4/5   Right Knee Extension 3+/5   Left Knee Flexion 4/5   Left Knee Extension 4/5   Right Ankle Dorsiflexion 4/5   Right Ankle Plantar Flexion 4/5   Transfers   Transfers Sit to Stand;Stand to Sit   Sit to Stand 4: Min guard;With upper extremity assist;From chair/3-in-1;Five times sit to stand  posterior lean in 5x sit<>stand; needs UE to stand/incr time   Stand to Sit 4: Min assist;With upper extremity assist;To chair/3-in-1  uncontrolled descent   Ambulation/Gait   Ambulation/Gait Yes   Ambulation/Gait Assistance 4: Min guard   Ambulation Distance (Feet) 50 Feet   Assistive device Straight cane   Gait Pattern Decreased step length - right;Decreased stance time - right;Decreased weight  shift to right;Decreased dorsiflexion - right;Trunk flexed;Wide base of support;Poor foot clearance - right;Poor foot clearance - left   Gait velocity 15.40 sec = 2.13 ft/sec   Standardized Balance Assessment   Standardized Balance Assessment Timed Up and Go Test;Berg Balance Test  Berg score 33/56 (scores <45/56 incr. fall risk)   Berg Balance Test   Sit to Stand Able to stand using hands after several tries   Standing Unsupported Able to stand 2 minutes with supervision   Sitting with Back Unsupported but Feet Supported on Floor or Stool Able to sit safely and securely 2 minutes   Stand to Sit Sits independently, has uncontrolled descent   Transfers Needs one person to assist   Standing Unsupported with Eyes Closed Able to stand 10 seconds with supervision   Standing Ubsupported with Feet Together Able to place feet together independently and stand 1 minute safely   From Standing, Reach Forward with Outstretched Arm Can reach forward >12 cm safely (5")   From Standing Position, Pick up Object from Floor Able to pick up shoe, needs supervision   From Standing Position, Turn to Look Behind Over each Shoulder Looks behind from both sides and weight shifts well   Turn 360 Degrees Needs close supervision or verbal cueing   Standing Unsupported, Alternately Place Feet on Step/Stool Needs assistance to keep from falling or unable to try   Standing Unsupported, One Foot in Front Able to plae foot ahead of the other independently and hold 30 seconds   Standing on One Leg Tries to lift leg/unable to hold 3 seconds but remains standing independently   Total Score 33   Timed Up and Go Test   TUG Normal TUG   Normal TUG (seconds) 23.65   TUG Comments Requires close supervision and needs assistance to complete full turn prior to sitting with uncontrolled descent into sititng              PT Short Term Goals - 01/29/14 1208    PT SHORT TERM GOAL #1   Title Pt will perform HEP with  family/caregiver supervision, to improve transfers, balance.   Time 4   Period Weeks   Status New   PT SHORT TERM GOAL #2   Title perform at least 8 of 10 reps of sit<>stand transfers with minimal UE support and no posterior lean, for improved safety and efficiency with transfers.   Time 4   Period Weeks   Status New   PT SHORT TERM GOAL #3   Title improve Berg Balance score to at least 38/56 for decreased fall risk.   Time 4   Period Weeks  Status New   PT SHORT TERM GOAL #4   Title improve Timed Up and Go score to less than or equal to 20 seconds for decreased fall risk.   Time 4   Period Weeks   Status New          PT Long Term Goals - 02/26/2014 1210    PT LONG TERM GOAL #1   Title verbalize/demonstrate techniques for fall prevention within home environment.   Time 8   Period Weeks   Status New   PT LONG TERM GOAL #2   Title perform at least 4 of 5 reps of sit<>stand transfers with no UE support, no posterior lean, for improved safety and efficiency with transfers.   Time 8   Period Weeks   Status New   PT LONG TERM GOAL #3   Title improve Berg Balance score to at least 43/56 for decreased fall risk.   Time 8   Period Weeks   Status New   PT LONG TERM GOAL #4   Title improve TUG score to less than or equal to 15 seconds for decreased fall risk.   Time 8   Period Weeks   Status New   PT LONG TERM GOAL #5   Title improve gait velocity to at least 2.62 ft/sec for improved gait efficiency and safety.   Time 8   Period Weeks   Status New          Plan - 02/26/14 1206    Clinical Impression Statement Pt is a 68 year old male with history of Parkinson's disease with recent ischemic optic neuropathy R eye, causing decreased to no vision R eye.  Pt is falling 1-2 times per week, has unctonrolled and unsafe sit<>stand transfers, and demonstrates decreased balance with standing and gati.   Pt will benefit from skilled therapeutic intervention in order to improve on  the following deficits Abnormal gait;Decreased activity tolerance;Decreased balance;Decreased cognition;Decreased mobility;Decreased knowledge of use of DME;Decreased range of motion;Decreased safety awareness;Decreased strength;Difficulty walking;Other (comment);Postural dysfunction  decreased timing and coordination of gait   Rehab Potential Good   Clinical Impairments Affecting Rehab Potential Cognition   PT Frequency 2x / week   PT Duration 8 weeks  plus evaluation   PT Treatment/Interventions ADLs/Self Care Home Management;Functional mobility training;Gait training;DME Instruction;Therapeutic activities;Therapeutic exercise;Balance training;Neuromuscular re-education;Patient/family education   PT Next Visit Plan Initiate HEP for transfers, standing balance; work on balance strategies in posterior direciton   Consulted and Agree with Plan of Care Patient;Family member/caregiver          G-Codes - 02-26-2014 1213    Functional Assessment Tool Used TUG (23.65 sec); gait velocity 2.13 ft/sec, Berg 33/56   Functional Limitation Mobility: Walking and moving around   Mobility: Walking and Moving Around Current Status [phone]) At least 60 percent but less than 80 percent impaired, limited or restricted   Mobility: Walking and Moving Around Goal Status [phone]) At least 40 percent but less than 60 percent impaired, limited or restricted                            Problem List Patient Active Problem List   Diagnosis Date Noted  . NAION (non-arteritic anterior ischemic optic neuropathy), right eye   . Diabetic neuropathy 08/08/2012  . Parkinson's disease 11/13/2011  . ESSENTIAL HYPERTENSION, BENIGN 05/08/2010  . HYPERCHOLESTEROLEMIA 04/03/2010  . HYPERTENSION 04/03/2010  . CAD 04/03/2010  . ASTHMA, CHILDHOOD 04/03/2010  . GERD  04/03/2010  . VENTRAL HERNIA 04/03/2010  . HIATAL HERNIA 04/03/2010  . BPH (benign prostatic hypertrophy) 04/03/2010  . DEGENERATIVE JOINT  DISEASE 04/03/2010  . MEMORY LOSS 04/03/2010  . DIABETES MELLITUS, BORDERLINE 04/03/2010    MARRIOTT,AMY W. 01/29/2014, 12:15 PM      Amy Marriott, PT 01/29/2014 12:16 PM Phone: [phone] Fax: [phone]

## 2014-02-01 ENCOUNTER — Encounter: Payer: Self-pay | Admitting: Family Medicine

## 2014-02-03 ENCOUNTER — Encounter: Payer: Self-pay | Admitting: Physical Therapy

## 2014-02-03 ENCOUNTER — Ambulatory Visit: Payer: Medicare Other | Admitting: Physical Therapy

## 2014-02-03 DIAGNOSIS — R269 Unspecified abnormalities of gait and mobility: Secondary | ICD-10-CM

## 2014-02-03 DIAGNOSIS — R293 Abnormal posture: Secondary | ICD-10-CM

## 2014-02-03 NOTE — Patient Instructions (Addendum)
Sit to Stand Transfers:  1. Scoot out to the edge of the chair 2. Place your feet flat on the floor, shoulder width apart.  Make sure your feet are tucked just under your knees. 3. Lean forward (nose over toes) with momentum, and stand up tall with your best posture.  If you need to use your arms, use them as a quick boost up to stand. 4. If you are in a low or soft chair, you can lean back and then forward up to stand, in order to get more momentum. 5. Once you are standing, make sure you are looking ahead and standing tall.  To sit down:  1. Back up until you feel the chair behind your legs. Bend at you hips, reaching  Back for you chair, if needed, then slowly squat to sit down on your chair.  Toe / Heel Raise   Gently rock back on heels and raise toes. Then rock forward on toes and raise heels. Repeat sequence __2 sets of 10__ times per session. Do __2-3__ sessions per day.  Copyright  VHI. All rights reserved.     Standing backward step and weighshift (LSVT BIG based exercise-backward step and reach) -2 sets of 10 reps, standing and facing counter

## 2014-02-03 NOTE — Therapy (Signed)
[facility] [address] [place], [place], [address] Phone: [phone]   Fax:  [phone]  Physical Therapy Treatment  Patient Details  Name: Jeremy Terry MRN: [id number] Date of Birth: [birth year]  Encounter Date: 02/03/2014      PT End of Session - 02/03/14 1139    Visit Number 2   Number of Visits 17   Date for PT Re-Evaluation 03/31/14   PT Start Time 0936   PT Stop Time 1016   PT Time Calculation (min) 40 min   Activity Tolerance Patient tolerated treatment well      Past Medical History  Diagnosis Date  . Coronary artery disease   . Hypertension   . Diabetes mellitus   . Hypercholesteremia   . BPH (benign prostatic hypertrophy)   . DJD (degenerative joint disease)   . Asthma   . Memory loss   . Ventral hernia   . GERD (gastroesophageal reflux disease)   . Allergic rhinitis   . Parkinsonian syndrome   . Hypogonadism male   . Neuropathy of leg     Bilateral  . Depression   . Parkinson disease   . NAION (non-arteritic anterior ischemic optic neuropathy), right eye     Followed by Dr. Katy Fitch    Past Surgical History  Procedure Laterality Date  . Coronary stent placement  2003  . Shoulder surgery  2011    rotator cuff    There were no vitals taken for this visit.  Visit Diagnosis:  Abnormality of gait  Abnormal posture      Subjective Assessment - 02/03/14 0940    Symptoms Wife reports that they have just gotten confirmation that pt has PSP.  They report that they are trying to grasp the reality of this diagnosis.  No falls since eval   Currently in Pain? No/denies            [facility] Adult PT Treatment/Exercise - 02/03/14 0943    Transfers   Transfers Sit to Stand;Stand to Sit   Sit to Stand 4: Min guard;From elevated surface;From chair/3-in-1;With upper extremity assist;Without upper extremity assist  10 reps each, with cues for technique   Stand to Sit 4: Min guard;With upper extremity  assist;Without upper extremity assist;To elevated surface;To chair/3-in-1   High Level Balance   High Level Balance Comments standing step back and weightshift at counter x 10 reps   Exercises   Exercises Balance   Balance Exercises   Heel Raises 10 reps;Left;Right  2 sets   Heel Raises Limitations Pt performs at counter with UE support   Toe Raise 10 reps;3 seconds  right and left, 2 sets    Transfer training also performed from <16 inch soft surfaces, x 6 reps, with cues for technique, to mimic lower, softer recliner chair at home.  Pt performs transfers slowly and cautiously, taking brief break in between exercises.  He needs frequent verbal cues for proper technique.      PT Education - 02/03/14 1138    Education provided Yes   Education Details Educated in proper/safe sit<>stand transfer technique.  Also initiated HEP with heel/toe raises and backward step and weightshifting   Person(s) Educated Patient;Spouse   Methods Explanation;Demonstration;Handout   Comprehension Verbalized understanding;Returned demonstration;Need further instruction              Plan - 02/03/14 1139    Clinical Impression Statement Today's session focused on safe/efficient transfer technique from progressively lower height surfaces as well as initiating HEP  to address balance.  Pt able to perform transfers and balance exercises with verbal and tactile cues.   Pt will benefit from skilled therapeutic intervention in order to improve on the following deficits Abnormal gait;Decreased activity tolerance;Decreased balance;Decreased cognition;Decreased mobility;Decreased knowledge of use of DME;Decreased range of motion;Decreased safety awareness;Decreased strength;Difficulty walking;Other (comment);Postural dysfunction   Rehab Potential Good   Clinical Impairments Affecting Rehab Potential Cognition   PT Frequency 2x / week   PT Duration 8 weeks   PT Treatment/Interventions ADLs/Self Care Home  Management;Functional mobility training;Gait training;DME Instruction;Therapeutic activities;Therapeutic exercise;Balance training;Neuromuscular re-education;Patient/family education   PT Next Visit Plan Review transfers and HEP, continue balance work; trial rolling walker for gait safety   Consulted and Agree with Plan of Care Patient;Family member/caregiver                               Problem List Patient Active Problem List   Diagnosis Date Noted  . NAION (non-arteritic anterior ischemic optic neuropathy), right eye   . Diabetic neuropathy 08/08/2012  . Parkinson's disease 11/13/2011  . ESSENTIAL HYPERTENSION, BENIGN 05/08/2010  . HYPERCHOLESTEROLEMIA 04/03/2010  . HYPERTENSION 04/03/2010  . CAD 04/03/2010  . ASTHMA, CHILDHOOD 04/03/2010  . GERD 04/03/2010  . VENTRAL HERNIA 04/03/2010  . HIATAL HERNIA 04/03/2010  . BPH (benign prostatic hypertrophy) 04/03/2010  . DEGENERATIVE JOINT DISEASE 04/03/2010  . MEMORY LOSS 04/03/2010  . DIABETES MELLITUS, BORDERLINE 04/03/2010    MARRIOTT,AMY W. 02/03/2014, 11:43 AM    Mady Haagensen, PT 02/03/2014 11:45 AM Phone: [phone] Fax: [phone]

## 2014-02-05 ENCOUNTER — Telehealth: Payer: Self-pay | Admitting: Neurology

## 2014-02-05 ENCOUNTER — Ambulatory Visit: Payer: Medicare Other | Admitting: Physical Therapy

## 2014-02-05 DIAGNOSIS — R269 Unspecified abnormalities of gait and mobility: Secondary | ICD-10-CM | POA: Diagnosis not present

## 2014-02-05 DIAGNOSIS — R293 Abnormal posture: Secondary | ICD-10-CM

## 2014-02-05 NOTE — Telephone Encounter (Signed)
Mardene Celeste, pt's wife called regarding pt's script RYTARY. Pt would like to generic due to the cost. C/B [phone]

## 2014-02-05 NOTE — Telephone Encounter (Signed)
I called the pharmacy and spoke with Corene Cornea.  He said that the carbidopa-levodopa does not come in the strength that you ordered for the [place].  Please advise.

## 2014-02-05 NOTE — Telephone Encounter (Signed)
I spoke with Jeremy Terry and she said that she tried to use the discount card to pay only $25 a month but the pharmacy would not take it since they have insurance.  She would rather not have to switch his medicine but it is costing them $311 a month.  Informed her that I would call Dionne Milo on Monday and see if there is anything else I can do.

## 2014-02-05 NOTE — Telephone Encounter (Signed)
I'm looking back and he wasn't doing well on that previously.  Caryl Pina, will you make sure that they want to do that, find out their out of pocket cost as I had already given them the patient assistance number (make sure that they contacted that number), and then call Dionne Milo (our rep for the drug...she was in here today) and find out if there is anything more that she can do before we change

## 2014-02-05 NOTE — Telephone Encounter (Signed)
Left message for Jeremy Terry to call me back.

## 2014-02-05 NOTE — Therapy (Signed)
[facility] [address] [place], [place], [address] Phone: [phone]   Fax:  [phone]  Physical Therapy Treatment  Patient Details  Name: Jeremy Terry MRN: [id number] Date of Birth: Oct 18, 1945  Encounter Date: 02/05/2014      PT End of Session - 02/05/14 1158    Visit Number 3  wk 1 of 8; G3   Number of Visits 17   Date for PT Re-Evaluation 03/31/14   PT Start Time 1108   PT Stop Time 1146   PT Time Calculation (min) 38 min   Activity Tolerance Patient tolerated treatment well  Needs frequent cues for gait safety      Past Medical History  Diagnosis Date  . Coronary artery disease   . Hypertension   . Diabetes mellitus   . Hypercholesteremia   . BPH (benign prostatic hypertrophy)   . DJD (degenerative joint disease)   . Asthma   . Memory loss   . Ventral hernia   . GERD (gastroesophageal reflux disease)   . Allergic rhinitis   . Parkinsonian syndrome   . Hypogonadism male   . Neuropathy of leg     Bilateral  . Depression   . Parkinson disease   . NAION (non-arteritic anterior ischemic optic neuropathy), right eye     Followed by Dr. Katy Fitch    Past Surgical History  Procedure Laterality Date  . Coronary stent placement  2003  . Shoulder surgery  2011    rotator cuff    There were no vitals taken for this visit.  Visit Diagnosis:  Abnormality of gait  Abnormal posture      Subjective Assessment - 02/05/14 1109    Symptoms No falls, no changes since last visit. Wife reports pt uses rollator at home, but does not like to use outside the home.   Currently in Pain? No/denies            [facility] Adult PT Treatment/Exercise - 02/05/14 1111    Transfers   Transfers Sit to Stand;Stand to Sit   Sit to Stand 4: Min guard;From elevated surface;With upper extremity assist  cues for technnique x 10 reps   Stand to Sit 4: Min guard;With upper extremity assist;To elevated surface   Ambulation/Gait   Ambulation/Gait Yes   Ambulation/Gait Assistance 5: Supervision   Ambulation/Gait Assistance Details cues for R foot placement (large amplitude movement for improved R foot clearance and R heelstrike) to avoid R foot catching on floor  Attempted foot up brace on R, with no change   Ambulation Distance (Feet) 115 Feet  x 2 reps, then 200 ft. , then 120 ft.   Assistive device Rollator   Gait Pattern Decreased step length - right;Decreased stance time - right;Decreased weight shift to right;Decreased dorsiflexion - right;Trunk flexed;Wide base of support;Poor foot clearance - right;Poor foot clearance - left   High Level Balance   High Level Balance Activities --  Marching in place x 10   High Level Balance Comments standing step back and weightshift at counter x 10 reps; forward step and weightshift over hurdle for increased step height and step length, x 10 reps bilaterally  Review of HEP from last visit; pt needs cont. cues   Balance Exercises: Standing   Heel Raises 10 reps;Left;Right   Heel Raises Limitations Pt performs at counter with UE support  Review of HEP; pt needs continued cues   Ankle Exercises: Standing   Toe Raise 10 reps;3 seconds  PT Education - 02/05/14 1156    Education provided Yes   Education Details Reviewed HEP, with pt needing continued cues for technique.  Had discussion with pt/wife regarding safety with gait, recommended pt use rollator walker (vs cane) for all gait due to pt's decreased R foot clearance and hastening with gait when using cane.   Person(s) Educated Patient;Spouse   Methods Explanation;Demonstration   Comprehension Verbalized understanding;Need further instruction          PT Short Term Goals - 02/05/14 1201    PT SHORT TERM GOAL #1   Title Pt will perform HEP with family/caregiver supervision, to improve transfers, balance.   Status New   PT SHORT TERM GOAL #2   Title perform at least 8 of 10 reps of sit<>stand transfers with  minimal UE support and no posterior lean, for improved safety and efficiency with transfers.   Status New   PT SHORT TERM GOAL #3   Title improve Berg Balance score to at least 38/56 for decreased fall risk.   Status New   PT SHORT TERM GOAL #4   Title improve Timed Up and Go score to less than or equal to 20 seconds for decreased fall risk.   Status New          PT Long Term Goals - 02/05/14 1201    PT LONG TERM GOAL #1   Title verbalize/demonstrate techniques for fall prevention within home environment.   Status New   PT LONG TERM GOAL #2   Title perform at least 4 of 5 reps of sit<>stand transfers with no UE support, no posterior lean, for improved safety and efficiency with transfers.   Status New   PT LONG TERM GOAL #3   Title improve Berg Balance score to at least 43/56 for decreased fall risk.   Status New   PT LONG TERM GOAL #4   Title improve TUG score to less than or equal to 15 seconds for decreased fall risk.   Status New   PT LONG TERM GOAL #5   Title improve gait velocity to at least 2.62 ft/sec for improved gait efficiency and safety.   Status New          Plan - 02/05/14 1159    Clinical Impression Statement Pt needs frequent cues and reminders during exercises, transfers for proper technique and during gait for improved foot clearance. Pt would be safer with gait using rollator walker versus cane.  Recommended to pt and wife that pt use rollator walker at all times.   Pt will benefit from skilled therapeutic intervention in order to improve on the following deficits Abnormal gait;Decreased activity tolerance;Decreased balance;Decreased cognition;Decreased mobility;Decreased knowledge of use of DME;Decreased range of motion;Decreased safety awareness;Decreased strength;Difficulty walking;Other (comment);Postural dysfunction   Rehab Potential Good   Clinical Impairments Affecting Rehab Potential Cognition   PT Frequency 2x / week   PT Duration 8 weeks  wk 1 of  8   PT Treatment/Interventions ADLs/Self Care Home Management;Functional mobility training;Gait training;DME Instruction;Therapeutic activities;Therapeutic exercise;Balance training;Neuromuscular re-education;Patient/family education   PT Next Visit Plan continue balance work at counter, exercises to improve foot clearance   Consulted and Agree with Plan of Care Patient;Family member/caregiver                               Problem List Patient Active Problem List   Diagnosis Date Noted  . NAION (non-arteritic anterior ischemic optic neuropathy), right  eye   . Diabetic neuropathy 08/08/2012  . Parkinson's disease 11/13/2011  . ESSENTIAL HYPERTENSION, BENIGN 05/08/2010  . HYPERCHOLESTEROLEMIA 04/03/2010  . HYPERTENSION 04/03/2010  . CAD 04/03/2010  . ASTHMA, CHILDHOOD 04/03/2010  . GERD 04/03/2010  . VENTRAL HERNIA 04/03/2010  . HIATAL HERNIA 04/03/2010  . BPH (benign prostatic hypertrophy) 04/03/2010  . DEGENERATIVE JOINT DISEASE 04/03/2010  . MEMORY LOSS 04/03/2010  . DIABETES MELLITUS, BORDERLINE 04/03/2010    Azzam Mehra W. 02/05/2014, 12:03 PM     Tauren Delbuono, PT 02/05/2014 12:04 PM Phone: [phone] Fax: [phone]

## 2014-02-08 NOTE — Telephone Encounter (Signed)
I spoke with Jeremy Terry and she said that the patient will probably need a medical exception letter.  Please advise.

## 2014-02-09 NOTE — Telephone Encounter (Signed)
Spoke with patient's wife and made her aware that $25 copay card only good for [employer]. We don't need an appeal letter because medication already approved through insurance. They have looked into patient assistance program and they do not qualify. Let them know those are really our options. They will continue medication, because they do not want to switch right now, but if the financial burden becomes too great they will let us know.

## 2014-02-09 NOTE — Telephone Encounter (Signed)
Jade, I don't know what this means or entails and not sure that Caryl Pina understands this drug well enough when she was talking to drug co.  Does this mean an appeal letter?

## 2014-02-10 ENCOUNTER — Ambulatory Visit: Payer: Medicare Other | Admitting: Physical Therapy

## 2014-02-10 ENCOUNTER — Telehealth: Payer: Self-pay | Admitting: Neurology

## 2014-02-10 ENCOUNTER — Encounter: Payer: Self-pay | Admitting: Physical Therapy

## 2014-02-10 DIAGNOSIS — R269 Unspecified abnormalities of gait and mobility: Secondary | ICD-10-CM | POA: Diagnosis not present

## 2014-02-10 DIAGNOSIS — R293 Abnormal posture: Secondary | ICD-10-CM

## 2014-02-10 NOTE — Telephone Encounter (Signed)
Per Dr Tat - "please let patient/wife/daughter know that neuropsych did not feel that he would be a good DBS candidate.  This is generally not something that I override."    Called and spoke with patient's wife. They did already discuss with [facility] that he was not a candidate and they expressed appreciation for the call. Will keep appt in February and call with any questions prior.

## 2014-02-10 NOTE — Therapy (Signed)
[facility] [address] [place], [place], [address] Phone: [phone]   Fax:  [phone]  Physical Therapy Treatment  Patient Details  Name: Jeremy Terry MRN: [id number] Date of Birth: [birth year]  Encounter Date: 02/10/2014      PT End of Session - 02/10/14 1653    Visit Number 4  G4 (Week 2 of 8)   Number of Visits 17   Date for PT Re-Evaluation 03/31/14   PT Start Time 0936   PT Stop Time 1016   PT Time Calculation (min) 40 min   Equipment Utilized During Treatment Gait belt   Activity Tolerance Patient tolerated treatment well   Behavior During Therapy Flat affect  Pt appears to have difficulty understanding the walker height adjustments today.        Past Medical History  Diagnosis Date  . Coronary artery disease   . Hypertension   . Diabetes mellitus   . Hypercholesteremia   . BPH (benign prostatic hypertrophy)   . DJD (degenerative joint disease)   . Asthma   . Memory loss   . Ventral hernia   . GERD (gastroesophageal reflux disease)   . Allergic rhinitis   . Parkinsonian syndrome   . Hypogonadism male   . Neuropathy of leg     Bilateral  . Depression   . Parkinson disease   . NAION (non-arteritic anterior ischemic optic neuropathy), right eye     Followed by Dr. Katy Fitch    Past Surgical History  Procedure Laterality Date  . Coronary stent placement  2003  . Shoulder surgery  2011    rotator cuff    There were no vitals taken for this visit.  Visit Diagnosis:  Abnormality of gait  Abnormal posture      Subjective Assessment - 02/10/14 0938    Symptoms Had a fall off the edge of the barstool on Saturday.  No complaints from that.  Pt presents to PT today with rollator walker   Currently in Pain? No/denies            [facility] Adult PT Treatment/Exercise - 02/10/14 0946    Ambulation/Gait   Ambulation/Gait Yes   Ambulation/Gait Assistance 4: Min guard   Ambulation/Gait Assistance Details  Cues for posture and for increased step length; requires several stops and restarts during gait for maximal step length and foot clearance   Ambulation Distance (Feet) 230 Feet  ft x 2, then 200 ft resisted gait   Assistive device Rollator  Adjusted to appropriate height; pt requests to lower   Gait Pattern Decreased step length - right;Decreased stance time - right;Decreased weight shift to right;Decreased dorsiflexion - right;Trunk flexed;Wide base of support;Poor foot clearance - right;Poor foot clearance - left   Lumbar Exercises: Aerobic   Stationary Bike SciFit, Level 2, 4 extremities x 8 minutes, for leg strengthening, with RPM at 50, cues to increase intensity to >60 (pt unable)  Pt requires frequent verbal cues for intensity          PT Education - 02/10/14 1651    Education provided Yes   Education Details Discussed height of walker; adjusted/readjusted rollator walker height.  Discussed/practiced with pt and wife cueing to stop with rollator and reset during gait to avoid rollator getting too far out in of patient.  Discussed cueing for large steps once he starts walking again.   Person(s) Educated Patient;Spouse   Methods Explanation;Demonstration   Comprehension Verbalized understanding;Returned demonstration;Verbal cues required  PT Short Term Goals - 02/10/14 1659    PT SHORT TERM GOAL #1   Title Pt will perform HEP with family/caregiver supervision, to improve transfers, balance. (Target:  02/28/14)   Status On-going   PT SHORT TERM GOAL #2   Title perform at least 8 of 10 reps of sit<>stand transfers with minimal UE support and no posterior lean, for improved safety and efficiency with transfers.   Status On-going   PT SHORT TERM GOAL #3   Title improve Berg Balance score to at least 38/56 for decreased fall risk.   Status On-going   PT SHORT TERM GOAL #4   Title improve Timed Up and Go score to less than or equal to 20 seconds for decreased fall risk.    Status On-going            Plan - 02/10/14 1657    Clinical Impression Statement Pt needs frequent breaks during gait (approximately every 30-50 ft) to stop and reset posture and step length.  Pt is unable to achieve changes in gait pattern while he is walking, but it does improve after brief stop and starting again.  Pt appears safer with rollator, though cueing for pacing and step length will be important, as he still has tendency for hastening of gait.   Pt will benefit from skilled therapeutic intervention in order to improve on the following deficits Abnormal gait;Decreased activity tolerance;Decreased balance;Decreased cognition;Decreased mobility;Decreased knowledge of use of DME;Decreased range of motion;Decreased safety awareness;Decreased strength;Difficulty walking;Other (comment);Postural dysfunction   Rehab Potential Good   Clinical Impairments Affecting Rehab Potential Cognition   PT Frequency 2x / week   PT Duration 8 weeks  wk 2 of 8   PT Treatment/Interventions ADLs/Self Care Home Management;Functional mobility training;Gait training;DME Instruction;Therapeutic activities;Therapeutic exercise;Balance training;Neuromuscular re-education;Patient/family education   PT Next Visit Plan continue balance work at counter, exercises to improve foot clearance   Consulted and Agree with Plan of Care Patient;Family member/caregiver                               Problem List Patient Active Problem List   Diagnosis Date Noted  . NAION (non-arteritic anterior ischemic optic neuropathy), right eye   . Diabetic neuropathy 08/08/2012  . Parkinson's disease 11/13/2011  . ESSENTIAL HYPERTENSION, BENIGN 05/08/2010  . HYPERCHOLESTEROLEMIA 04/03/2010  . HYPERTENSION 04/03/2010  . CAD 04/03/2010  . ASTHMA, CHILDHOOD 04/03/2010  . GERD 04/03/2010  . VENTRAL HERNIA 04/03/2010  . HIATAL HERNIA 04/03/2010  . BPH (benign prostatic hypertrophy) 04/03/2010  .  DEGENERATIVE JOINT DISEASE 04/03/2010  . MEMORY LOSS 04/03/2010  . DIABETES MELLITUS, BORDERLINE 04/03/2010    Barth Trella W. 02/10/2014, 5:01 PM     Adleigh Mcmasters, PT 02/10/2014 5:02 PM Phone: [phone] Fax: [phone]

## 2014-02-10 NOTE — Progress Notes (Signed)
Patient ID: Jeremy Terry, male   DOB: Mar 13, 1945, 68 y.o.   MRN: [id number] 68 yo f/u for CAD  Has not been seen since 2013  there. Admitted 2005 for angina. Had cypher DES to LAD. No ETT since. CRF;s include  DM,HTN. previous smoker.  Diabetic and needs to decrease carbs and loose weight. No recent A1c. 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 Marland Kitchen Denies palpitations, edema, syncope. Compliant with meds. Has had memory issues the last ferw years and MRI showed atrophy only. Memory difficulty obvious during exam. Nuclear study done 2/12 normal with good EF Tolerating lipitor. W/U for parkinsons and memory loss ongoing.   Primary is [name].  Reviewed chol. HDL low at 26 and LDL 46 In Accelerate trial  Had retinal embolus a few weeks ago on aggrenox now  LE edema worse and not responding to lasix daily  Seeing Dr Tat and Parkinson's bad especially with LUE and gait  Falls a lot   ROS: Denies fever, malais, weight loss, blurry vision, decreased visual acuity, cough, sputum, SOB, hemoptysis, pleuritic pain, palpitaitons, heartburn, abdominal pain, melena, lower extremity edema, claudication, or rash.  All other systems reviewed and negative  General: Affect flat Healthy:  appears stated age HEENT: normal Neck supple with no adenopathy JVP normal no bruits no thyromegaly Lungs clear with no wheezing and good diaphragmatic motion Heart:  S1/S2 no murmur, no rub, gallop or click PMI normal Abdomen: benighn, BS positve, no tenderness, no AAA no bruit.  No HSM or HJR Distal pulses intact with no bruits Plus two bilateral edema with venous stasis  Neuro non-focal  parkinsons with LUE tremor  Skin warm and dry No muscular weakness   Current Outpatient Prescriptions  Medication Sig Dispense Refill  . albuterol (PROVENTIL HFA;VENTOLIN HFA) 108 (90 BASE) MCG/ACT inhaler Inhale 2 puffs into the lungs every 6 (six) hours as needed for wheezing or shortness of breath. 1 Inhaler 0  .  Albuterol Sulfate (VENTOLIN HFA IN) Inhale into the lungs. Inhale 2 puffs q 6 hours prn wheezing    . atorvastatin (LIPITOR) 40 MG tablet TAKE ONE TABLET BY MOUTH DAILY 15 tablet 0  . bacitracin-polymyxin b (POLYSPORIN) ophthalmic ointment apply to eye every 3 hours while awake 3.5 g 0  . buPROPion (WELLBUTRIN XL) 150 MG 24 hr tablet Take 1 tablet (150 mg total) by mouth daily. 30 tablet 11  . Carbidopa-Levodopa ER (RYTARY) 23.75-95 MG CPCR Take 4 tablets by mouth 3 (three) times daily. Take 4 tablets in am, 4 tablets in the afternoon, 3 tablets in the evening (Patient not taking: Reported on 01/29/2014) 330 capsule 5  . clopidogrel (PLAVIX) 75 MG tablet TAKE 1 TABLET (75 MG TOTAL) BY MOUTH DAILY. (Patient not taking: Reported on 01/29/2014) 30 tablet 12  . dipyridamole-aspirin (AGGRENOX) 200-25 MG per 12 hr capsule Take 1 capsule by mouth 2 (two) times daily. 60 capsule 5  . donepezil (ARICEPT) 10 MG tablet Take 1 tablet (10 mg total) by mouth at bedtime. 90 tablet 3  . furosemide (LASIX) 20 MG tablet TAKE 1 TABLET (20 MG TOTAL) BY MOUTH DAILY. 30 tablet 3  . glipiZIDE (GLUCOTROL) 5 MG tablet TAKE 1 TABLET (5 MG TOTAL) BY MOUTH 2 (TWO) TIMES DAILY BEFORE A MEAL. 60 tablet 3  . glucose blood test strip Use as instructed 100 each 1  . glucose monitoring kit (FREESTYLE) monitoring kit 1 each by Does not apply route as needed for other. 1 each 0  . Lancets  28G MISC 1 Container by Does not apply route daily. 100 each 1  . losartan (COZAAR) 100 MG tablet TAKE 1 TABLET BY MOUTH EVERY DAY 30 tablet 11  . meloxicam (MOBIC) 15 MG tablet TAKE 1 TABLET BY MOUTH EVERY DAY 30 tablet 2  . metoprolol succinate (TOPROL-XL) 50 MG 24 hr tablet Take 1 tablet (50 mg total) by mouth daily. 30 tablet 6  . mirtazapine (REMERON) 15 MG tablet TAKE 1 TABLET BY MOUTH AT BEDTIME 30 tablet 5  . Multiple Vitamin (MULTIVITAMIN) capsule Take 1 capsule by mouth daily.      . nitroGLYCERIN (NITROSTAT) 0.4 MG SL tablet Place 1 tablet  (0.4 mg total) under the tongue every 5 (five) minutes as needed. 25 tablet 11  . oxybutynin (DITROPAN) 5 MG tablet Take 2.5 mg by mouth 3 (three) times daily.     Marland Kitchen RAPAFLO 8 MG CAPS capsule TAKE 1 CAPSULE (8 MG TOTAL) BY MOUTH DAILY WITH BREAKFAST. FOR PROSTATE (Patient not taking: Reported on 01/29/2014) 30 capsule 11  . [DISCONTINUED] rOPINIRole (REQUIP) 1 MG tablet Take 1 mg by mouth 3 (three) times daily.      No current facility-administered medications for this visit.    Allergies  Hydrocodone; Morphine; and Sulfa antibiotics  Electrocardiogram:  SR rate 67 normal   Assessment and Plan

## 2014-02-11 ENCOUNTER — Encounter: Payer: Self-pay | Admitting: Cardiovascular Disease

## 2014-02-11 ENCOUNTER — Ambulatory Visit (INDEPENDENT_AMBULATORY_CARE_PROVIDER_SITE_OTHER): Payer: Medicare Other | Admitting: Cardiovascular Disease

## 2014-02-11 VITALS — BP 122/70 | HR 98 | Ht 69.0 in | Wt 243.1 lb

## 2014-02-11 DIAGNOSIS — E7800 Pure hypercholesterolemia, unspecified: Secondary | ICD-10-CM

## 2014-02-11 DIAGNOSIS — G20 Parkinson's disease: Secondary | ICD-10-CM

## 2014-02-11 DIAGNOSIS — I2510 Atherosclerotic heart disease of native coronary artery without angina pectoris: Secondary | ICD-10-CM

## 2014-02-11 DIAGNOSIS — R609 Edema, unspecified: Secondary | ICD-10-CM

## 2014-02-11 DIAGNOSIS — I10 Essential (primary) hypertension: Secondary | ICD-10-CM

## 2014-02-11 LAB — BASIC METABOLIC PANEL
BUN: 17 mg/dL (ref 6–23)
CO2: 21 mEq/L (ref 19–32)
CREATININE: 0.9 mg/dL (ref 0.4–1.5)
Calcium: 9.1 mg/dL (ref 8.4–10.5)
Chloride: 106 mEq/L (ref 96–112)
GFR: 86.67 mL/min (ref >60.00)
Glucose, Bld: 108 mg/dL — ABNORMAL HIGH (ref 70–99)
POTASSIUM: 3.9 meq/L (ref 3.5–5.1)
Sodium: 136 mEq/L (ref 135–145)

## 2014-02-11 MED ORDER — TORSEMIDE 20 MG PO TABS: 20.0000 mg | ORAL_TABLET | Freq: Two times a day (BID) | ORAL | Status: AC

## 2014-02-11 NOTE — Assessment & Plan Note (Signed)
Stable with no angina and good activity level.  Continue medical Rx  

## 2014-02-11 NOTE — Assessment & Plan Note (Signed)
Well controlled.  Continue current medications and low sodium [place] type diet.    

## 2014-02-11 NOTE — Assessment & Plan Note (Signed)
Accelerate trial is done needs f/u lipids in May 16

## 2014-02-11 NOTE — Patient Instructions (Signed)
Your physician has recommended you make the following change in your medication:  1) STOP FUROSEMIDE 2) START DEMADEX 20MG  TWICE DAILY. AN RX HAS BEEN SENT TO YOUR PHARMACY  Lab Today: Bmet  Your physician recommends that you return for lab work in: 3 weeks (Bmet)  Your physician recommends that you schedule a follow-up appointment next available with Dr.Nishan

## 2014-02-11 NOTE — Assessment & Plan Note (Signed)
Signficant limitations f/u Dr Tat  Continue sinemet

## 2014-02-11 NOTE — Assessment & Plan Note (Signed)
Significant and contributing to falls with parkinsons  D/C Lasix add Demedex 20 bid  BMET today and in 3 weeks  F/U with me next available K 4.6 and Cr normal in July

## 2014-02-12 ENCOUNTER — Ambulatory Visit: Payer: Medicare Other | Admitting: Physical Therapy

## 2014-02-12 ENCOUNTER — Encounter: Payer: Self-pay | Admitting: Physical Therapy

## 2014-02-12 DIAGNOSIS — R269 Unspecified abnormalities of gait and mobility: Secondary | ICD-10-CM

## 2014-02-12 DIAGNOSIS — R293 Abnormal posture: Secondary | ICD-10-CM

## 2014-02-12 NOTE — Therapy (Signed)
[facility] [address] [place], [place], [address] Phone: [phone]   Fax:  [phone]  Physical Therapy Treatment  Patient Details  Name: Jeremy Terry MRN: [id number] Date of Birth: 16-Feb-1946  Encounter Date: 02/12/2014      PT End of Session - 02/12/14 1118    Visit Number 5  G5 (week 2 of 8)   Number of Visits 17   Date for PT Re-Evaluation 03/31/14   PT Start Time 1021   PT Stop Time 1102   PT Time Calculation (min) 41 min   Equipment Utilized During Treatment Gait belt   Activity Tolerance Patient tolerated treatment well   Behavior During Therapy Flat affect      Past Medical History  Diagnosis Date  . Coronary artery disease   . Hypertension   . Diabetes mellitus   . Hypercholesteremia   . BPH (benign prostatic hypertrophy)   . DJD (degenerative joint disease)   . Asthma   . Memory loss   . Ventral hernia   . GERD (gastroesophageal reflux disease)   . Allergic rhinitis   . Parkinsonian syndrome   . Hypogonadism male   . Neuropathy of leg     Bilateral  . Depression   . Parkinson disease   . NAION (non-arteritic anterior ischemic optic neuropathy), right eye     Followed by Dr. Katy Fitch    Past Surgical History  Procedure Laterality Date  . Coronary stent placement  2003  . Shoulder surgery  2011    rotator cuff    There were no vitals taken for this visit.  Visit Diagnosis:  Abnormality of gait  Abnormal posture      Subjective Assessment - 02/12/14 1024    Symptoms Feeling better today.  Wife reports that he went to cardiologist yesterday with a new change in medication for lower extremity swelling.  Fell yesterday at [facility] trying to grab railing/ did not get hurt.   Currently in Pain? No/denies      There Ex:  NuStep, Level 5, 4 extremities x 8 minutes, with cues for steps/minute >70, for improved lower extremity strength and intensity of exercise.    Neuro  Re-education:  Standing at counter for balance exercises-heel/toe raises 2 sets x 15 reps with cues for 3-second hold; step back and weight shift, side step and weight shift x 15 reps for improved balance recovery; modified quadruped PWR! (parkinson wellness recovery) Up at parallel bars for improved posture x 15 reps; Marching in place x 15 reps with cues for increased large amplitude movement and deliberate effort. Alternating forward step taps performed at 6 inch step then 12 inch step with UE support at rail, x 10 reps each, then side step taps at 6 inch step x 10 reps.  Step ups x 10 reps each leg, with bilateral UE support, for improved foot clearance and transition step.   Gait:  Gait training x 100 ft, then 230 ft, then 120 ft with seated rest breaks in between.  During gait, pt uses rollator walker with min guard assistance, with cues for improved posture and improved foot clearance.  Pt requires frequent standing breaks to reset posture and large amplitude movements for foot clearance.                    PT Short Term Goals - 02/12/14 1122    PT SHORT TERM GOAL #1   Title Pt will perform HEP with family/caregiver supervision,  to improve transfers, balance. (Target:  02/28/14)   Status On-going   PT SHORT TERM GOAL #2   Title perform at least 8 of 10 reps of sit<>stand transfers with minimal UE support and no posterior lean, for improved safety and efficiency with transfers.   Status On-going   PT SHORT TERM GOAL #3   Title improve Berg Balance score to at least 38/56 for decreased fall risk.   Status On-going   PT SHORT TERM GOAL #4   Title improve Timed Up and Go score to less than or equal to 20 seconds for decreased fall risk.   Status On-going                  Plan - 02/12/14 1119    Clinical Impression Statement Pt performs standing exercises well with cueing; however, pt continues to have difficulty with foot clearance during gait, requiring standing  breaks to reset posture and large amplitude movements for foot clearance.  Pt will continue to benefit from further skilled PT to address strength, posture, balance and gait.   Pt will benefit from skilled therapeutic intervention in order to improve on the following deficits Abnormal gait;Decreased activity tolerance;Decreased balance;Decreased cognition;Decreased mobility;Decreased knowledge of use of DME;Decreased range of motion;Decreased safety awareness;Decreased strength;Difficulty walking;Other (comment);Postural dysfunction   Rehab Potential Good   Clinical Impairments Affecting Rehab Potential Cognition   PT Frequency 2x / week   PT Duration 8 weeks  wk 2 of 8   PT Treatment/Interventions ADLs/Self Care Home Management;Functional mobility training;Gait training;DME Instruction;Therapeutic activities;Therapeutic exercise;Balance training;Neuromuscular re-education;Patient/family education   PT Next Visit Plan continue balance work at counter, exercises to improve foot clearance   Consulted and Agree with Plan of Care Patient;Family member/caregiver        Problem List Patient Active Problem List   Diagnosis Date Noted  . Edema 02/11/2014  . NAION (non-arteritic anterior ischemic optic neuropathy), right eye   . Diabetic neuropathy 08/08/2012  . Parkinson's disease 11/13/2011  . ESSENTIAL HYPERTENSION, BENIGN 05/08/2010  . HYPERCHOLESTEROLEMIA 04/03/2010  . Essential hypertension 04/03/2010  . Coronary atherosclerosis 04/03/2010  . ASTHMA, CHILDHOOD 04/03/2010  . GERD 04/03/2010  . VENTRAL HERNIA 04/03/2010  . HIATAL HERNIA 04/03/2010  . BPH (benign prostatic hypertrophy) 04/03/2010  . DEGENERATIVE JOINT DISEASE 04/03/2010  . MEMORY LOSS 04/03/2010  . DIABETES MELLITUS, BORDERLINE 04/03/2010    Kadarius Cuffe W. 02/12/2014, 11:27 AM  Mady Haagensen, PT 02/12/2014 11:27 AM Phone: [phone] Fax: [facility] [address] [place], [place], [address] Phone: [phone]   Fax:  [phone]

## 2014-02-15 ENCOUNTER — Ambulatory Visit: Payer: Medicare Other | Admitting: Physical Therapy

## 2014-02-15 ENCOUNTER — Other Ambulatory Visit: Payer: Self-pay | Admitting: Family Medicine

## 2014-02-15 ENCOUNTER — Encounter: Payer: Self-pay | Admitting: Physical Therapy

## 2014-02-15 ENCOUNTER — Encounter: Payer: Self-pay | Admitting: Cardiology

## 2014-02-15 DIAGNOSIS — R269 Unspecified abnormalities of gait and mobility: Secondary | ICD-10-CM

## 2014-02-15 DIAGNOSIS — R293 Abnormal posture: Secondary | ICD-10-CM

## 2014-02-15 NOTE — Therapy (Signed)
[facility] [address] [place], [place], [address] Phone: [phone]   Fax:  [phone]  Physical Therapy Treatment  Patient Details  Name: Jeremy Terry MRN: [id number] Date of Birth: 01-09-1946  Encounter Date: 02/15/2014      PT End of Session - 02/15/14 0928    Visit Number 6  G6 (wk 3 of 8)   Number of Visits 17   Date for PT Re-Evaluation 03/31/14   PT Start Time 0849   PT Stop Time 0931   PT Time Calculation (min) 42 min   Activity Tolerance Patient tolerated treatment well   Behavior During Therapy Flat affect      Past Medical History  Diagnosis Date  . Coronary artery disease   . Hypertension   . Diabetes mellitus   . Hypercholesteremia   . BPH (benign prostatic hypertrophy)   . DJD (degenerative joint disease)   . Asthma   . Memory loss   . Ventral hernia   . GERD (gastroesophageal reflux disease)   . Allergic rhinitis   . Parkinsonian syndrome   . Hypogonadism male   . Neuropathy of leg     Bilateral  . Depression   . Parkinson disease   . NAION (non-arteritic anterior ischemic optic neuropathy), right eye     Followed by Dr. Katy Fitch    Past Surgical History  Procedure Laterality Date  . Coronary stent placement  2003  . Shoulder surgery  2011    rotator cuff    There were no vitals taken for this visit.  Visit Diagnosis:  Abnormality of gait  Abnormal posture      Subjective Assessment - 02/15/14 0851    Symptoms Off balance today-forgot to take a dose of medications yesterday.  Fell off stool again yesterday; wife reports pt does not fully sit on stool.     Currently in Pain? No/denies     OBJECTIVE:  Therapeutic Activity:  Sit<>stand transfers from bar stool height, x 5 reps, with cues for hip flexion, buttock placement appropriately on stool for improved scooting and decreased risk of falling.  Pt reports the stool he sits on at home tends to move.  Practiced several  reps of sit<>stand from stool, with shelf liner under stool legs to decrease movement of stool.  Educated pt and wife in use of shelf liner under stool legs at home as well as need for increased hip flexion for placement on stool to decrease risk of falling off stool in the future.  Sit<>stand at seat of rollator walker during standing balance activities at counter, turning to sit and sit>stand at NuStep, with verbal cues for hand placement and to fully turn and position body properly before sitting.  Neuro Reeducation: Standing at counter:  Heel toe raises15 reps with cues for 3 second hold; step back and weight shift 15 reps bilaterally; step side and weightshift x 15 reps bilaterally, with UE support.  Cues provided for maximal large amplitude movement.  Combined forward/backward step and weightshift x 15 reps each side, with cues for improved foot clearance in each direction. Marching in place x 15 reps, with cues for large amplitude movement.  TherEx: Seated leg strengthening on NuStep, Level 6 x 8 minutes, 4 extremities, with cues to keep steps/minute >70 for improved intensity of exercise.  Pt needs frequent cues for pacing on NuStep.  PT Short Term Goals - 02/12/14 1122    PT SHORT TERM GOAL #1   Title Pt will perform HEP with family/caregiver supervision, to improve transfers, balance. (Target:  02/28/14)   Status On-going   PT SHORT TERM GOAL #2   Title perform at least 8 of 10 reps of sit<>stand transfers with minimal UE support and no posterior lean, for improved safety and efficiency with transfers.   Status On-going   PT SHORT TERM GOAL #3   Title improve Berg Balance score to at least 38/56 for decreased fall risk.   Status On-going   PT SHORT TERM GOAL #4   Title improve Timed Up and Go score to less than or equal to 20 seconds for decreased fall risk.   Status On-going           PT Long Term Goals - 02/05/14 1201    PT LONG TERM  GOAL #1   Title verbalize/demonstrate techniques for fall prevention within home environment.   Status New   PT LONG TERM GOAL #2   Title perform at least 4 of 5 reps of sit<>stand transfers with no UE support, no posterior lean, for improved safety and efficiency with transfers.   Status New   PT LONG TERM GOAL #3   Title improve Berg Balance score to at least 43/56 for decreased fall risk.   Status New   PT LONG TERM GOAL #4   Title improve TUG score to less than or equal to 15 seconds for decreased fall risk.   Status New   PT LONG TERM GOAL #5   Title improve gait velocity to at least 2.62 ft/sec for improved gait efficiency and safety.   Status New               Plan - 02/15/14 1030    Clinical Impression Statement Pt continues to need frequent cueing during standing exercises, gait and seated therapeutic activity for improved overall large amplitude movements and consistent performance of functional movement activities.  Pt continues to demonstrate decreased safety awareness with transfers and gait.  Pt will continue to benefit from further skilled PT to address strength, posture, balance gait.   Pt will benefit from skilled therapeutic intervention in order to improve on the following deficits Abnormal gait;Decreased activity tolerance;Decreased balance;Decreased cognition;Decreased mobility;Decreased knowledge of use of DME;Decreased range of motion;Decreased safety awareness;Decreased strength;Difficulty walking;Other (comment);Postural dysfunction   Rehab Potential Good   Clinical Impairments Affecting Rehab Potential Cognition   PT Frequency 2x / week   PT Duration 8 weeks  wk 3 of 8   PT Treatment/Interventions ADLs/Self Care Home Management;Functional mobility training;Gait training;DME Instruction;Therapeutic activities;Therapeutic exercise;Balance training;Neuromuscular re-education;Patient/family education   PT Next Visit Plan posture exercises in sitting and  standing; dynamic balance exercises at counter   Consulted and Agree with Plan of Care Patient;Family member/caregiver        Problem List Patient Active Problem List   Diagnosis Date Noted  . Edema 02/11/2014  . NAION (non-arteritic anterior ischemic optic neuropathy), right eye   . Diabetic neuropathy 08/08/2012  . Parkinson's disease 11/13/2011  . ESSENTIAL HYPERTENSION, BENIGN 05/08/2010  . HYPERCHOLESTEROLEMIA 04/03/2010  . Essential hypertension 04/03/2010  . Coronary atherosclerosis 04/03/2010  . ASTHMA, CHILDHOOD 04/03/2010  . GERD 04/03/2010  . VENTRAL HERNIA 04/03/2010  . HIATAL HERNIA 04/03/2010  . BPH (benign prostatic hypertrophy) 04/03/2010  . DEGENERATIVE JOINT DISEASE 04/03/2010  . MEMORY LOSS 04/03/2010  . DIABETES MELLITUS, BORDERLINE 04/03/2010    Mackenze Grandison W.  02/15/2014, 10:35 AM  Mady Haagensen, PT 02/15/2014 10:38 AM Phone: [phone] Fax: [facility] [address] [place], [place], [address] Phone: [phone]   Fax:  [phone]

## 2014-02-16 ENCOUNTER — Encounter: Payer: Self-pay | Admitting: Family Medicine

## 2014-02-16 ENCOUNTER — Telehealth: Payer: Self-pay | Admitting: Neurology

## 2014-02-16 NOTE — Telephone Encounter (Signed)
Jeremy Terry, pt's wife called requesting a refill for pt's script RYTARY .  Pharmacy: [facility] [name] # [phone]

## 2014-02-16 NOTE — Telephone Encounter (Signed)
Patient's wife aware I have sent this via computer, but called [facility] and okayed refill.

## 2014-02-17 ENCOUNTER — Ambulatory Visit: Payer: Medicare Other | Admitting: Physical Therapy

## 2014-02-17 ENCOUNTER — Encounter: Payer: Self-pay | Admitting: Physical Therapy

## 2014-02-17 DIAGNOSIS — R269 Unspecified abnormalities of gait and mobility: Secondary | ICD-10-CM

## 2014-02-17 DIAGNOSIS — R293 Abnormal posture: Secondary | ICD-10-CM

## 2014-02-17 NOTE — Patient Instructions (Signed)
PELVIC TILT: Anterior   Start in slumped position. Roll pelvis forward to arch back. __10_ reps per set, _2__ sets per day  Copyright  VHI. All rights reserved.  Scapular Retraction (Standing)   With arms at sides, pinch shoulder blades together. Repeat _10___ times per set. Do _2___ sets per session. Do _2___ sessions per day.  http://orth.exer.us/944   Copyright  VHI. All rights reserved.  Axial Extension (Chin Tuck)   Pull chin in and lengthen back of neck. Hold __3__ seconds while counting out loud. Repeat __10__ times. Do __2_ sessions per day.  http://gt2.exer.us/449   Copyright  VHI. All rights reserved.

## 2014-02-17 NOTE — Therapy (Signed)
[facility] [address] [place], [place], [address] Phone: [phone]   Fax:  [phone]  Physical Therapy Treatment  Patient Details  Name: Jeremy Terry MRN: [id number] Date of Birth: 1945/11/21  Encounter Date: 02/17/2014      PT End of Session - 02/17/14 1739    Visit Number 7  G7-wk 3 of 8   Number of Visits 17  pt spent at least 6 minutes in bathroom during session   Date for PT Re-Evaluation 03/31/14   PT Start Time 1022   PT Stop Time 1100  pt spent at least 6 minutes in bathroom during session   PT Time Calculation (min) 38 min   Equipment Utilized During Treatment Gait belt   Activity Tolerance Patient limited by lethargy   Behavior During Therapy Flat affect      Past Medical History  Diagnosis Date  . Coronary artery disease   . Hypertension   . Diabetes mellitus   . Hypercholesteremia   . BPH (benign prostatic hypertrophy)   . DJD (degenerative joint disease)   . Asthma   . Memory loss   . Ventral hernia   . GERD (gastroesophageal reflux disease)   . Allergic rhinitis   . Parkinsonian syndrome   . Hypogonadism male   . Neuropathy of leg     Bilateral  . Depression   . Parkinson disease   . NAION (non-arteritic anterior ischemic optic neuropathy), right eye     Followed by Dr. Katy Fitch    Past Surgical History  Procedure Laterality Date  . Coronary stent placement  2003  . Shoulder surgery  2011    rotator cuff    There were no vitals taken for this visit.  Visit Diagnosis:  Abnormality of gait  Abnormal posture      Subjective Assessment - 02/17/14 1025    Symptoms The shelf liner placed on the floor beneath stool has helped.  No falls since last visit.   Currently in Pain? No/denies       OBJECTIVE: TherEx:  Seated leg strengthening on NuStep:  Level 6, x 8 minutes, 4 extremities with cues to keep steps/minute >70 for improved intensity of exercise.  SEated postural  exercises, then standing postural exercises :  Seated pelvic tilts x 10 reps, scapular retraction x 10 reps with tactile cues.  Standing at doorframe:  Neck retraction with towel behind neck 10 reps, then scapular retraction x 10 reps.  (Added to HEP)  Pt requires frequent verbal cues for large, deliberate movement patterns.  Pt was assisted to restroom, where wife assisted him.  Pt reports overall just feeling"off" today and previous visit.  (Pt was in restroom for approximately 6 minutes of session)  Self Care:  Wife has questions about pt taking a long time to process commands and cues for normal, daily tasks.  Discussed slowed processing, and discussed using brief, concise verbal cues as well as occasional tactile cues.  Discussed pt's not feeling well this week-she reports he's sleeping fine and taking medications appropriately.  Recommended she contact doctor if he continues to feel bad or if symptoms change/intensify.                     PT Education - 02/17/14 1743    Education provided Yes   Education Details HEP for posture   Person(s) Educated Patient;Spouse   Methods Explanation;Demonstration;Handout   Comprehension Verbalized understanding;Returned demonstration;Verbal cues required  PT Short Term Goals - 02/12/14 1122    PT SHORT TERM GOAL #1   Title Pt will perform HEP with family/caregiver supervision, to improve transfers, balance. (Target:  02/28/14)   Status On-going   PT SHORT TERM GOAL #2   Title perform at least 8 of 10 reps of sit<>stand transfers with minimal UE support and no posterior lean, for improved safety and efficiency with transfers.   Status On-going   PT SHORT TERM GOAL #3   Title improve Berg Balance score to at least 38/56 for decreased fall risk.   Status On-going   PT SHORT TERM GOAL #4   Title improve Timed Up and Go score to less than or equal to 20 seconds for decreased fall risk.   Status On-going           PT Long  Term Goals - 02/05/14 1201    PT LONG TERM GOAL #1   Title verbalize/demonstrate techniques for fall prevention within home environment.   Status New   PT LONG TERM GOAL #2   Title perform at least 4 of 5 reps of sit<>stand transfers with no UE support, no posterior lean, for improved safety and efficiency with transfers.   Status New   PT LONG TERM GOAL #3   Title improve Berg Balance score to at least 43/56 for decreased fall risk.   Status New   PT LONG TERM GOAL #4   Title improve TUG score to less than or equal to 15 seconds for decreased fall risk.   Status New   PT LONG TERM GOAL #5   Title improve gait velocity to at least 2.62 ft/sec for improved gait efficiency and safety.   Status New               Plan - 02/17/14 1740    Clinical Impression Statement Pt seems more fatigued than usual during today's session.  Had discussion with wife regarding cueing for daily tasks to assist with functional mobility.   Pt will benefit from skilled therapeutic intervention in order to improve on the following deficits Abnormal gait;Decreased activity tolerance;Decreased balance;Decreased cognition;Decreased mobility;Decreased knowledge of use of DME;Decreased range of motion;Decreased safety awareness;Decreased strength;Difficulty walking;Other (comment);Postural dysfunction   Rehab Potential Good   Clinical Impairments Affecting Rehab Potential Cognition   PT Frequency 2x / week   PT Duration 8 weeks  wk 3 of 8   PT Treatment/Interventions ADLs/Self Care Home Management;Functional mobility training;Gait training;DME Instruction;Therapeutic activities;Therapeutic exercise;Balance training;Neuromuscular re-education;Patient/family education   PT Next Visit Plan Review posture exercises   Consulted and Agree with Plan of Care Patient;Family member/caregiver        Problem List Patient Active Problem List   Diagnosis Date Noted  . Edema 02/11/2014  . NAION (non-arteritic anterior  ischemic optic neuropathy), right eye   . Diabetic neuropathy 08/08/2012  . Parkinson's disease 11/13/2011  . ESSENTIAL HYPERTENSION, BENIGN 05/08/2010  . HYPERCHOLESTEROLEMIA 04/03/2010  . Essential hypertension 04/03/2010  . Coronary atherosclerosis 04/03/2010  . ASTHMA, CHILDHOOD 04/03/2010  . GERD 04/03/2010  . VENTRAL HERNIA 04/03/2010  . HIATAL HERNIA 04/03/2010  . BPH (benign prostatic hypertrophy) 04/03/2010  . DEGENERATIVE JOINT DISEASE 04/03/2010  . MEMORY LOSS 04/03/2010  . DIABETES MELLITUS, BORDERLINE 04/03/2010    Elyna Pangilinan W. 02/17/2014, 5:44 PM  Mady Haagensen, PT 02/17/2014 5:44 PM Phone: [phone] Fax: [facility] [address] [place], [place], [address] Phone: [phone]   Fax:  [phone]

## 2014-02-23 ENCOUNTER — Ambulatory Visit: Payer: Medicare Other | Admitting: Physical Therapy

## 2014-02-24 ENCOUNTER — Ambulatory Visit: Payer: Medicare Other

## 2014-02-24 ENCOUNTER — Encounter: Payer: Self-pay | Admitting: Physical Therapy

## 2014-02-24 DIAGNOSIS — R269 Unspecified abnormalities of gait and mobility: Secondary | ICD-10-CM | POA: Diagnosis not present

## 2014-02-24 DIAGNOSIS — R293 Abnormal posture: Secondary | ICD-10-CM

## 2014-02-24 NOTE — Therapy (Signed)
[facility] [address] [place], [place], [address] Phone: [phone]   Fax:  [phone]  Physical Therapy Treatment  Patient Details  Name: Jeremy Terry MRN: [id number] Date of Birth: [birth year]  Encounter Date: 02/24/2014      PT End of Session - 02/24/14 1141    Visit Number 8   Number of Visits 17   Date for PT Re-Evaluation 03/31/14   PT Start Time 0934   PT Stop Time 1020   PT Time Calculation (min) 46 min      Past Medical History  Diagnosis Date  . Coronary artery disease   . Hypertension   . Diabetes mellitus   . Hypercholesteremia   . BPH (benign prostatic hypertrophy)   . DJD (degenerative joint disease)   . Asthma   . Memory loss   . Ventral hernia   . GERD (gastroesophageal reflux disease)   . Allergic rhinitis   . Parkinsonian syndrome   . Hypogonadism male   . Neuropathy of leg     Bilateral  . Depression   . Parkinson disease   . NAION (non-arteritic anterior ischemic optic neuropathy), right eye     Followed by Dr. Katy Fitch    Past Surgical History  Procedure Laterality Date  . Coronary stent placement  2003  . Shoulder surgery  2011    rotator cuff    There were no vitals taken for this visit.  Visit Diagnosis:  Abnormality of gait  Abnormal posture      Subjective Assessment - 02/24/14 0939    Symptoms Pt's wife reports she frequently has to cue him to keep the rollator close to him and is asking if we can order a RW with two wheels.   Currently in Pain? No/denies          [facility] PT Assessment - 02/24/14 0001    Standardized Balance Assessment   Standardized Balance Assessment Berg Balance Test;Timed Up and Go Test   Berg Balance Test   Sit to Stand Able to stand  independently using hands   Standing Unsupported Able to stand safely 2 minutes   Sitting with Back Unsupported but Feet Supported on Floor or Stool Able to sit safely and securely 2 minutes   Stand to  Sit Sits safely with minimal use of hands   Transfers Able to transfer safely, definite need of hands   Standing Unsupported with Eyes Closed Able to stand 10 seconds safely   Standing Ubsupported with Feet Together Able to place feet together independently and stand 1 minute safely   From Standing, Reach Forward with Outstretched Arm Can reach confidently >25 cm (10")   From Standing Position, Pick up Object from Floor Able to pick up shoe, needs supervision   From Standing Position, Turn to Look Behind Over each Shoulder Looks behind from both sides and weight shifts well   Turn 360 Degrees Able to turn 360 degrees safely but slowly   Standing Unsupported, Alternately Place Feet on Step/Stool Able to complete 4 steps without aid or supervision   Standing Unsupported, One Foot in [employer] balance while stepping or standing   Standing on One Leg Able to lift leg independently and hold equal to or more than 3 seconds   Total Score 43   Timed Up and Go Test   TUG Normal TUG   Normal TUG (seconds) 35.72      Therex: Reviewed pt's posture exercise home exercise program with pt  and wife requiring instructions for correct performance of cervical retraction and pelvic tilts. Performs scapular retractions as indicated.  Neuro Re-ed: Merrilee Jansky balance test performed. While attempting partial tandem stance, pt had abrupt loss of balance with no evidence of ankle or hip strategy in efforts to correct and required MAX A to prevent fall. Also completed 2 repetitions of TUG with rollator. First rep not counted due to pt loss of balance with the 180 degree turn. Pt noted to take backwards step during turn causing loss of balance and also takes backwards steps to approach a chair.  Will benefit from further training for this. Also reviewed pt's initial HEP including sit to stands, backward step/weight shift, and anterior/posterior limits of stability.                       PT Short Term Goals  - 02/24/14 0944    PT SHORT TERM GOAL #1   Title Pt will perform HEP with family/caregiver supervision, to improve transfers, balance. (Target:  02/28/14)   Status Partially Met  caregiver and pt require some cueing for correct performance. Pt is doing HEP at home   PT SHORT TERM GOAL #2   Title perform at least 8 of 10 reps of sit<>stand transfers with minimal UE support and no posterior lean, for improved safety and efficiency with transfers.   Status Partially Met  pt performs 1 rep without UE support, then 10 reps with BUE support without retropulsion or posterior lean.    PT SHORT TERM GOAL #3   Title improve Berg Balance score to at least 38/56 for decreased fall risk.   Status Achieved  Scored 43/56 on 02/24/14   PT SHORT TERM GOAL #4   Title improve Timed Up and Go score to less than or equal to 20 seconds for decreased fall risk.   Status Not Met  35.72 seconds on 02/24/14           PT Long Term Goals - 02/05/14 1201    PT LONG TERM GOAL #1   Title verbalize/demonstrate techniques for fall prevention within home environment.   Status New   PT LONG TERM GOAL #2   Title perform at least 4 of 5 reps of sit<>stand transfers with no UE support, no posterior lean, for improved safety and efficiency with transfers.   Status New   PT LONG TERM GOAL #3   Title improve Berg Balance score to at least 43/56 for decreased fall risk.   Status New   PT LONG TERM GOAL #4   Title improve TUG score to less than or equal to 15 seconds for decreased fall risk.   Status New   PT LONG TERM GOAL #5   Title improve gait velocity to at least 2.62 ft/sec for improved gait efficiency and safety.   Status New               Plan - 02/24/14 1142    Clinical Impression Statement Pt's shuffling gait causes toe scuffing which increases his risk of tripping/falling. May benefit form leather toe caps for his shoes for increased step through. Pt met Berg goal and partially met HEP goal. Provided  tips to wife for proper cueing of cervical retraction and countertop exercises.   PT Next Visit Plan Pt's wife would like pt to trial rolling walker and determine if it is appropriate for him.        Problem List Patient Active Problem List  Diagnosis Date Noted  . Edema 02/11/2014  . NAION (non-arteritic anterior ischemic optic neuropathy), right eye   . Diabetic neuropathy 08/08/2012  . Parkinson's disease 11/13/2011  . ESSENTIAL HYPERTENSION, BENIGN 05/08/2010  . HYPERCHOLESTEROLEMIA 04/03/2010  . Essential hypertension 04/03/2010  . Coronary atherosclerosis 04/03/2010  . ASTHMA, CHILDHOOD 04/03/2010  . GERD 04/03/2010  . VENTRAL HERNIA 04/03/2010  . HIATAL HERNIA 04/03/2010  . BPH (benign prostatic hypertrophy) 04/03/2010  . DEGENERATIVE JOINT DISEASE 04/03/2010  . MEMORY LOSS 04/03/2010  . DIABETES MELLITUS, BORDERLINE 04/03/2010    Delrae Sawyers D 02/24/2014, 12:07 PM  [facility] [address] [place], [place], [address] Phone: [phone]   Fax:  [phone]

## 2014-03-02 ENCOUNTER — Encounter: Payer: Self-pay | Admitting: Physical Therapy

## 2014-03-02 ENCOUNTER — Ambulatory Visit: Payer: Medicare Other | Attending: Neurology | Admitting: Physical Therapy

## 2014-03-02 DIAGNOSIS — R293 Abnormal posture: Secondary | ICD-10-CM

## 2014-03-02 DIAGNOSIS — R269 Unspecified abnormalities of gait and mobility: Secondary | ICD-10-CM | POA: Insufficient documentation

## 2014-03-02 NOTE — Therapy (Signed)
[facility] [address] [place], [place], [address] Phone: [phone]   Fax:  [phone]  Physical Therapy Treatment  Patient Details  Name: Jeremy Terry MRN: [id number] Date of Birth: August 11, 1945  Encounter Date: 03/02/2014      PT End of Session - 03/02/14 1123    Visit Number 9  wk 5 of 8   Number of Visits 17   Date for PT Re-Evaluation 03/31/14   Authorization Type G-Code due next visit   PT Start Time 1021   PT Stop Time 1101   PT Time Calculation (min) 40 min   Equipment Utilized During Treatment Gait belt   Activity Tolerance Patient limited by lethargy   Behavior During Therapy Flat affect      Past Medical History  Diagnosis Date  . Coronary artery disease   . Hypertension   . Diabetes mellitus   . Hypercholesteremia   . BPH (benign prostatic hypertrophy)   . DJD (degenerative joint disease)   . Asthma   . Memory loss   . Ventral hernia   . GERD (gastroesophageal reflux disease)   . Allergic rhinitis   . Parkinsonian syndrome   . Hypogonadism male   . Neuropathy of leg     Bilateral  . Depression   . Parkinson disease   . NAION (non-arteritic anterior ischemic optic neuropathy), right eye     Followed by Dr. Katy Fitch    Past Surgical History  Procedure Laterality Date  . Coronary stent placement  2003  . Shoulder surgery  2011    rotator cuff    There were no vitals taken for this visit.  Visit Diagnosis:  No diagnosis found.      Subjective Assessment - 03/02/14 1025    Symptoms Pt reports having 2 falls in the past week.  He reports falling upon getting up from the chair; second fall occurred getting out of the shower.  He reprots no pain/injury except bruing R elbow.   Currently in Pain? No/denies       Gait:  Discussed options for walkers:  Rolling walker vs. rollator 4-wheeled rolling walker.  Pt ambulates with 2-wheeled RW x 350 ft with supervision and cues for upright  posture and increased foot clearance/step length during gait.  Pt appears to have significantly increased forward lean and forward flexed posture with increased pressure through arms through walker.  Pt ambulates with 4-wheeled rollator walker x 230 ft with supervision and cues for upright posture and pacing to allow for improved step length and foot clearance on RLE.  Pt ambulates with decreased foot clearance and decreased heelstrike on RLE despite cues.  Pt does not appear to have improved gait pattern with 2-wheeled rolling walker.  Gait x 115 ft with rollator walker and simulated R leather toe-cap.  Pt continues to have decreased foot clearance and decreased step length.  Short distance gait with marching for turns-marching in place 2 sets x 10 at rollator for support, with cues for maximal deliberate effort, followed by gait x 10 ft with turning in place, with supervision and cues for marching to improve weightshifting and foot clearance during turns.  Self Care:  Discussed with pt and wife that 2-wheeled RW does not add additional benefit for improved gait due to pt's increased forward lean and strong reliance through upper extremities.  Discussed with pt and wife that simulated toe-cap does not provide improved foot clearance or step length on RLE with gait.  Ultimately  discussed that wife providing appropriate cueing, and pt using rollator walker within household is helping to optimize safety with gait.  Discussed importance of short distance gait to less episodes of hastening with gait and excessive forward flexed posture.                     PT Education - 03/02/14 1122    Education provided Yes   Education Details Safety with gait-including use of rollator walker at all times, even in the home; use of cueing strategies for optimal safety with gait and turns   Person(s) Educated Patient;Spouse   Methods Explanation;Demonstration   Comprehension Verbalized understanding;Verbal  cues required          PT Short Term Goals - 02/24/14 0944    PT SHORT TERM GOAL #1   Title Pt will perform HEP with family/caregiver supervision, to improve transfers, balance. (Target:  02/28/14)   Status Partially Met  caregiver and pt require some cueing for correct performance. Pt is doing HEP at home   PT SHORT TERM GOAL #2   Title perform at least 8 of 10 reps of sit<>stand transfers with minimal UE support and no posterior lean, for improved safety and efficiency with transfers.   Status Partially Met  pt performs 1 rep without UE support, then 10 reps with BUE support without retropulsion or posterior lean.    PT SHORT TERM GOAL #3   Title improve Berg Balance score to at least 38/56 for decreased fall risk.   Status Achieved  Scored 43/56 on 02/24/14   PT SHORT TERM GOAL #4   Title improve Timed Up and Go score to less than or equal to 20 seconds for decreased fall risk.   Status Not Met  35.72 seconds on 02/24/14           PT Long Term Goals - 02/05/14 1201    PT LONG TERM GOAL #1   Title verbalize/demonstrate techniques for fall prevention within home environment.   Status New   PT LONG TERM GOAL #2   Title perform at least 4 of 5 reps of sit<>stand transfers with no UE support, no posterior lean, for improved safety and efficiency with transfers.   Status New   PT LONG TERM GOAL #3   Title improve Berg Balance score to at least 43/56 for decreased fall risk.   Status New   PT LONG TERM GOAL #4   Title improve TUG score to less than or equal to 15 seconds for decreased fall risk.   Status New   PT LONG TERM GOAL #5   Title improve gait velocity to at least 2.62 ft/sec for improved gait efficiency and safety.   Status New               Plan - 03/02/14 1125    Clinical Impression Statement Attempted gait with leather toe caps, but that does not improve increased step length or foot clearance.  Discussed need to stop gait every 20-30 ft to reset posture  and pacing with gait due to increased pace, increased forward flexed posture, and increased likelihood of falling.  Wife verbalizes understanding and reports she is attempting to cue him at home, but pt often resists.   Pt will benefit from skilled therapeutic intervention in order to improve on the following deficits Abnormal gait;Decreased activity tolerance;Decreased balance;Decreased cognition;Decreased mobility;Decreased knowledge of use of DME;Decreased range of motion;Decreased safety awareness;Decreased strength;Difficulty walking;Other (comment);Postural dysfunction   Rehab Potential Good  Clinical Impairments Affecting Rehab Potential Cognition   PT Frequency 2x / week   PT Duration 8 weeks  wk 5 of 8   PT Treatment/Interventions ADLs/Self Care Home Management;Functional mobility training;Gait training;DME Instruction;Therapeutic activities;Therapeutic exercise;Balance training;Neuromuscular re-education;Patient/family education   PT Next Visit Plan GCODE next visit.  Work on Warden/ranger, multi-directional stepping, hip/ankle strategies at [employer].   Consulted and Agree with Plan of Care Patient;Family member/caregiver        Problem List Patient Active Problem List   Diagnosis Date Noted  . Edema 02/11/2014  . NAION (non-arteritic anterior ischemic optic neuropathy), right eye   . Diabetic neuropathy 08/08/2012  . Parkinson's disease 11/13/2011  . ESSENTIAL HYPERTENSION, BENIGN 05/08/2010  . HYPERCHOLESTEROLEMIA 04/03/2010  . Essential hypertension 04/03/2010  . Coronary atherosclerosis 04/03/2010  . ASTHMA, CHILDHOOD 04/03/2010  . GERD 04/03/2010  . VENTRAL HERNIA 04/03/2010  . HIATAL HERNIA 04/03/2010  . BPH (benign prostatic hypertrophy) 04/03/2010  . DEGENERATIVE JOINT DISEASE 04/03/2010  . MEMORY LOSS 04/03/2010  . DIABETES MELLITUS, BORDERLINE 04/03/2010    Hertha Gergen W. 03/02/2014, 11:29 AM  Mady Haagensen, PT 03/02/2014 11:36 AM Phone: [phone] Fax:  [facility] [address] [place], [place], [address] Phone: [phone]   Fax:  [phone]

## 2014-03-04 ENCOUNTER — Encounter: Payer: Self-pay | Admitting: Physical Therapy

## 2014-03-04 ENCOUNTER — Emergency Department (HOSPITAL_COMMUNITY): Payer: Medicare Other

## 2014-03-04 ENCOUNTER — Other Ambulatory Visit: Payer: Medicare Other

## 2014-03-04 ENCOUNTER — Emergency Department (HOSPITAL_COMMUNITY)
Admission: EM | Admit: 2014-03-04 | Discharge: 2014-03-04 | Disposition: A | Discharge status: Home / Self Care | Payer: Medicare Other | Attending: Emergency Medicine | Admitting: Emergency Medicine

## 2014-03-04 ENCOUNTER — Encounter (HOSPITAL_COMMUNITY): Payer: Self-pay | Admitting: Emergency Medicine

## 2014-03-04 ENCOUNTER — Ambulatory Visit: Payer: Medicare Other | Admitting: Physical Therapy

## 2014-03-04 VITALS — BP 106/66 | HR 90

## 2014-03-04 DIAGNOSIS — W19XXXA Unspecified fall, initial encounter: Secondary | ICD-10-CM

## 2014-03-04 DIAGNOSIS — Z8739 Personal history of other diseases of the musculoskeletal system and connective tissue: Secondary | ICD-10-CM | POA: Diagnosis not present

## 2014-03-04 DIAGNOSIS — E780 Pure hypercholesterolemia: Secondary | ICD-10-CM | POA: Insufficient documentation

## 2014-03-04 DIAGNOSIS — Y998 Other external cause status: Secondary | ICD-10-CM | POA: Diagnosis not present

## 2014-03-04 DIAGNOSIS — Z8719 Personal history of other diseases of the digestive system: Secondary | ICD-10-CM | POA: Insufficient documentation

## 2014-03-04 DIAGNOSIS — I2510 Atherosclerotic heart disease of native coronary artery without angina pectoris: Secondary | ICD-10-CM | POA: Diagnosis not present

## 2014-03-04 DIAGNOSIS — N400 Enlarged prostate without lower urinary tract symptoms: Secondary | ICD-10-CM | POA: Diagnosis not present

## 2014-03-04 DIAGNOSIS — I10 Essential (primary) hypertension: Secondary | ICD-10-CM | POA: Diagnosis not present

## 2014-03-04 DIAGNOSIS — W01198A Fall on same level from slipping, tripping and stumbling with subsequent striking against other object, initial encounter: Secondary | ICD-10-CM | POA: Insufficient documentation

## 2014-03-04 DIAGNOSIS — Y92009 Unspecified place in unspecified non-institutional (private) residence as the place of occurrence of the external cause: Secondary | ICD-10-CM | POA: Diagnosis not present

## 2014-03-04 DIAGNOSIS — Z791 Long term (current) use of non-steroidal anti-inflammatories (NSAID): Secondary | ICD-10-CM | POA: Insufficient documentation

## 2014-03-04 DIAGNOSIS — R269 Unspecified abnormalities of gait and mobility: Secondary | ICD-10-CM | POA: Diagnosis not present

## 2014-03-04 DIAGNOSIS — R293 Abnormal posture: Secondary | ICD-10-CM

## 2014-03-04 DIAGNOSIS — E119 Type 2 diabetes mellitus without complications: Secondary | ICD-10-CM | POA: Diagnosis not present

## 2014-03-04 DIAGNOSIS — Z7982 Long term (current) use of aspirin: Secondary | ICD-10-CM | POA: Insufficient documentation

## 2014-03-04 DIAGNOSIS — Z9861 Coronary angioplasty status: Secondary | ICD-10-CM | POA: Diagnosis not present

## 2014-03-04 DIAGNOSIS — Z79899 Other long term (current) drug therapy: Secondary | ICD-10-CM | POA: Insufficient documentation

## 2014-03-04 DIAGNOSIS — Z87891 Personal history of nicotine dependence: Secondary | ICD-10-CM | POA: Insufficient documentation

## 2014-03-04 DIAGNOSIS — G20 Parkinson's disease: Secondary | ICD-10-CM | POA: Diagnosis not present

## 2014-03-04 DIAGNOSIS — Y9389 Activity, other specified: Secondary | ICD-10-CM | POA: Diagnosis not present

## 2014-03-04 DIAGNOSIS — S0990XA Unspecified injury of head, initial encounter: Secondary | ICD-10-CM | POA: Diagnosis not present

## 2014-03-04 DIAGNOSIS — F329 Major depressive disorder, single episode, unspecified: Secondary | ICD-10-CM | POA: Insufficient documentation

## 2014-03-04 DIAGNOSIS — J45909 Unspecified asthma, uncomplicated: Secondary | ICD-10-CM | POA: Diagnosis not present

## 2014-03-04 DIAGNOSIS — Z8673 Personal history of transient ischemic attack (TIA), and cerebral infarction without residual deficits: Secondary | ICD-10-CM | POA: Insufficient documentation

## 2014-03-04 NOTE — ED Notes (Signed)
Pt has parkinsons and was shuffling too fast and fell forward into cabinet. Has small goose egg on head. Dizziness but no LOC; is on blood thinners.

## 2014-03-04 NOTE — Therapy (Signed)
[facility] [address] [place], [place], [address] Phone: [phone]   Fax:  [phone]  Physical Therapy Treatment  Patient Details  Name: Jeremy Terry MRN: [id number] Date of Birth: November 06, 1945 Referring Provider:  Susy Frizzle, MD  Encounter Date: 03/04/2014      PT End of Session - 03/04/14 1211    Visit Number 10   Number of Visits 17   Date for PT Re-Evaluation 03/31/14   PT Start Time 1110   PT Stop Time 1153   PT Time Calculation (min) 43 min   Equipment Utilized During Treatment Gait belt   Activity Tolerance Patient limited by lethargy   Behavior During Therapy Flat affect      Past Medical History  Diagnosis Date  . Coronary artery disease   . Hypertension   . Diabetes mellitus   . Hypercholesteremia   . BPH (benign prostatic hypertrophy)   . DJD (degenerative joint disease)   . Asthma   . Memory loss   . Ventral hernia   . GERD (gastroesophageal reflux disease)   . Allergic rhinitis   . Parkinsonian syndrome   . Hypogonadism male   . Neuropathy of leg     Bilateral  . Depression   . Parkinson disease   . NAION (non-arteritic anterior ischemic optic neuropathy), right eye     Followed by Dr. Katy Fitch    Past Surgical History  Procedure Laterality Date  . Coronary stent placement  2003  . Shoulder surgery  2011    rotator cuff    BP 106/66 mmHg  Pulse 90  Visit Diagnosis:  Abnormality of gait  Abnormal posture      Subjective Assessment - 03/04/14 1114    Symptoms Pt fell this morning while carrying dog not using walker.  Denies injury other than "bump" on head.  Wife states she almost took him to the ER as his head was hurting initially beut denies pain presently.   Currently in Pain? No/denies                    [facility] Adult PT Treatment/Exercise - 03/04/14 1205    Transfers   Transfers Sit to Stand;Stand to Sit   Sit to Stand 4: Min guard;From elevated  surface;With upper extremity assist;Other/comment  cues for hand placement   Stand to Sit 4: Min guard;With upper extremity assist;With armrests;Other (comment)  cues for hand placement   Ambulation/Gait   Ambulation/Gait Yes   Ambulation/Gait Assistance 4: Min guard   Ambulation/Gait Assistance Details cues for increased step length on R and decreasing speed   Ambulation Distance (Feet) 110 Feet  three times   Assistive device Rollator   Gait Pattern Decreased step length - right;Decreased stance time - right;Decreased weight shift to right;Decreased dorsiflexion - right;Trunk flexed;Wide base of support;Poor foot clearance - right;Poor foot clearance - left            [facility]) - 03/04/14 1207    Step and Reach Forward Other reps (comment)  15 with UE support   Step and Reach Backward Other reps (comment)  15 with UE support   Step and Reach Sideways Other reps (comment)  15 with UE support     Hip strategies at counter using counter bump posteriorly and pulling back into upright. Bil heel and toe raises at counter x 10 each       PT Education - 03/04/14 1211    Education provided Yes  Education Details Recommended for wife to take patient to ED secondary to fall this am in which pt hit his head and c/o dizziness currently.   Person(s) Educated Patient;Spouse   Methods Explanation          PT Short Term Goals - 02/24/14 0944    PT SHORT TERM GOAL #1   Title Pt will perform HEP with family/caregiver supervision, to improve transfers, balance. (Target:  02/28/14)   Status Partially Met  caregiver and pt require some cueing for correct performance. Pt is doing HEP at home   PT SHORT TERM GOAL #2   Title perform at least 8 of 10 reps of sit<>stand transfers with minimal UE support and no posterior lean, for improved safety and efficiency with transfers.   Status Partially Met  pt performs 1 rep without UE support, then 10 reps with BUE support without retropulsion  or posterior lean.    PT SHORT TERM GOAL #3   Title improve Berg Balance score to at least 38/56 for decreased fall risk.   Status Achieved  Scored 43/56 on 02/24/14   PT SHORT TERM GOAL #4   Title improve Timed Up and Go score to less than or equal to 20 seconds for decreased fall risk.   Status Not Met  35.72 seconds on 02/24/14           PT Long Term Goals - 02/05/14 1201    PT LONG TERM GOAL #1   Title verbalize/demonstrate techniques for fall prevention within home environment.   Status New   PT LONG TERM GOAL #2   Title perform at least 4 of 5 reps of sit<>stand transfers with no UE support, no posterior lean, for improved safety and efficiency with transfers.   Status New   PT LONG TERM GOAL #3   Title improve Berg Balance score to at least 43/56 for decreased fall risk.   Status New   PT LONG TERM GOAL #4   Title improve TUG score to less than or equal to 15 seconds for decreased fall risk.   Status New   PT LONG TERM GOAL #5   Title improve gait velocity to at least 2.62 ft/sec for improved gait efficiency and safety.   Status New               Plan - 2014/03/22 1212    Clinical Impression Statement Pt c/o dizziness with standing activities and took BP.  Wife reports fall this am and pt hit top of his head with visible bump.  Had headache/pain after fall but denies at present.  Encouraged wife to take pt to ED secondary to fall.  Wife agreed.  Pt states he does not feel as coordinated today.   Pt will benefit from skilled therapeutic intervention in order to improve on the following deficits Abnormal gait;Decreased activity tolerance;Decreased balance;Decreased cognition;Decreased mobility;Decreased knowledge of use of DME;Decreased range of motion;Decreased safety awareness;Decreased strength;Difficulty walking;Other (comment);Postural dysfunction   Rehab Potential Good   Clinical Impairments Affecting Rehab Potential Cognition   PT Frequency 2x / week   PT  Duration 8 weeks   PT Treatment/Interventions ADLs/Self Care Home Management;Functional mobility training;Gait training;DME Instruction;Therapeutic activities;Therapeutic exercise;Balance training;Neuromuscular re-education;Patient/family education   PT Next Visit Plan Wife to schedule 2 additional weeks of PT for 3 more weeks total per POC.  Continue balance and gait.   Consulted and Agree with Plan of Care Patient;Family member/caregiver          G-Codes - 22-Mar-2014  1216    Functional Assessment Tool Used BERG 43/56, TUG 35.72 sec with Rollator      Problem List Patient Active Problem List   Diagnosis Date Noted  . Edema 02/11/2014  . NAION (non-arteritic anterior ischemic optic neuropathy), right eye   . Diabetic neuropathy 08/08/2012  . Parkinson's disease 11/13/2011  . ESSENTIAL HYPERTENSION, BENIGN 05/08/2010  . HYPERCHOLESTEROLEMIA 04/03/2010  . Essential hypertension 04/03/2010  . Coronary atherosclerosis 04/03/2010  . ASTHMA, CHILDHOOD 04/03/2010  . GERD 04/03/2010  . VENTRAL HERNIA 04/03/2010  . HIATAL HERNIA 04/03/2010  . BPH (benign prostatic hypertrophy) 04/03/2010  . DEGENERATIVE JOINT DISEASE 04/03/2010  . MEMORY LOSS 04/03/2010  . DIABETES MELLITUS, BORDERLINE 04/03/2010    Narda Bonds 03/04/2014, 12:18 PM  [facility] [address] [place], [place], [address] Phone: [phone]   Fax:  [place] 03/04/2014 12:18 pm Phone: [phone] Fax: [phone]

## 2014-03-04 NOTE — ED Provider Notes (Signed)
Patient tripped and fell 7:30 AM today in his home. He was not using a walker he struck the top of his head. No other injury. No loss of consciousness. On exam patient is alert Glasgow Coma Score 15 HEENT exam normal cephalic atraumatic. No scalp hematoma. Neck supple. No tenderness. Neurologic Glasgow Coma Score 15. Pill-rolling consistent with Parkinson's disease. Moves all extremity is well. No distress 5 over 5 overall cranial nerves II through XII grossly intact. CT scan of brain ordered as patient is on blood thinners  Orlie Dakin, MD 03/04/14 1314

## 2014-03-04 NOTE — ED Notes (Addendum)
Pt reports dizziness since fall this am. Denies shortness of breath and chest pain. Alert and oriented x4. Denies cervical/spinal tenderness on palpation.

## 2014-03-04 NOTE — ED Notes (Signed)
Pt up at bedside with assistance using urinal.

## 2014-03-04 NOTE — Discharge Instructions (Signed)
Use your walker when walking at home.

## 2014-03-04 NOTE — ED Notes (Signed)
Patient transported to CT 

## 2014-03-04 NOTE — ED Provider Notes (Signed)
CSN: [id number]     Arrival date & time 03/04/14  1209 History   First MD Initiated Contact with Patient 03/04/14 1253     Chief Complaint  Patient presents with  . Fall  . Head Injury     (Consider location/radiation/quality/duration/timing/severity/associated sxs/prior Treatment) HPI Comments: Patient with a history of Parkinson's presents today after a fall that occurred at 7:30 AM today.  He reports that he has some gait disturbances secondary to Parkinson's and was shuffling too fast and fell forward.  He hit the top of his head on a cabinet when he fell.  No LOC.  He denies headache, CP, SOB, vision changes, nausea, vomiting, extremity pain, neck pain, or back pain.  No facial droop, numbness, tingling, or new difficulty swallowing.Marland KitchenHe is currently on Aggrenox due to history of CVA.  He has not taken any pain medications prior to arrival.    Patient is a 69 y.o. male presenting with fall and head injury. The history is provided by the patient.  Fall  Head Injury   Past Medical History  Diagnosis Date  . Coronary artery disease   . Hypertension   . Diabetes mellitus   . Hypercholesteremia   . BPH (benign prostatic hypertrophy)   . DJD (degenerative joint disease)   . Asthma   . Memory loss   . Ventral hernia   . GERD (gastroesophageal reflux disease)   . Allergic rhinitis   . Parkinsonian syndrome   . Hypogonadism male   . Neuropathy of leg     Bilateral  . Depression   . Parkinson disease   . NAION (non-arteritic anterior ischemic optic neuropathy), right eye     Followed by Dr. Katy Fitch   Past Surgical History  Procedure Laterality Date  . Coronary stent placement  2003  . Shoulder surgery  2011    rotator cuff   Family History  Problem Relation Age of Onset  . Other Father     respiratory problems   History  Substance Use Topics  . Smoking status: Former Smoker    Quit date: 02/26/1998  . Smokeless tobacco: Never Used  . Alcohol Use: Yes     Comment: Occ     Review of Systems  All other systems reviewed and are negative.     Allergies  Hydrocodone; Morphine; and Sulfa antibiotics  Home Medications   Prior to Admission medications   Medication Sig Start Date End Date Taking? Authorizing Provider  albuterol (PROVENTIL HFA;VENTOLIN HFA) 108 (90 BASE) MCG/ACT inhaler Inhale 2 puffs into the lungs every 6 (six) hours as needed for wheezing or shortness of breath. 05/01/13   Susy Frizzle, MD  atorvastatin (LIPITOR) 40 MG tablet TAKE ONE TABLET BY MOUTH DAILY 08/13/13   Josue Hector, MD  buPROPion (WELLBUTRIN XL) 150 MG 24 hr tablet Take 1 tablet (150 mg total) by mouth daily. 10/13/13   Eustace Quail Tat, DO  Carbidopa-Levodopa ER (RYTARY) 23.75-95 MG CPCR Take 4 tablets by mouth 3 (three) times daily. Take 4 tablets in am, 4 tablets in the afternoon, 3 tablets in the evening 08/25/13   Eustace Quail Tat, DO  dipyridamole-aspirin (AGGRENOX) 200-25 MG per 12 hr capsule Take 1 capsule by mouth 2 (two) times daily. 12/29/13   Rebecca S Tat, DO  donepezil (ARICEPT) 10 MG tablet Take 1 tablet (10 mg total) by mouth at bedtime. 12/29/12   Rebecca S Tat, DO  glipiZIDE (GLUCOTROL) 5 MG tablet TAKE 1 TABLET (5 MG TOTAL) BY  MOUTH 2 (TWO) TIMES DAILY BEFORE A MEAL. 02/15/14   Susy Frizzle, MD  glucose blood test strip Use as instructed 07/13/13   Susy Frizzle, MD  glucose monitoring kit (FREESTYLE) monitoring kit 1 each by Does not apply route as needed for other. 07/13/13   Susy Frizzle, MD  Lancets 28G MISC 1 Container by Does not apply route daily. 07/13/13   Susy Frizzle, MD  losartan (COZAAR) 100 MG tablet TAKE 1 TABLET BY MOUTH EVERY DAY 07/09/13   Susy Frizzle, MD  meloxicam (MOBIC) 15 MG tablet TAKE 1 TABLET BY MOUTH EVERY DAY 02/15/14   Susy Frizzle, MD  metoprolol succinate (TOPROL-XL) 50 MG 24 hr tablet Take 1 tablet (50 mg total) by mouth daily. 09/30/12   Josue Hector, MD  mirtazapine (REMERON) 15 MG tablet TAKE 1 TABLET BY MOUTH  AT BEDTIME 01/04/14   Eustace Quail Tat, DO  Multiple Vitamin (MULTIVITAMIN) capsule Take 1 capsule by mouth daily.      Historical Provider, MD  nitroGLYCERIN (NITROSTAT) 0.4 MG SL tablet Place 1 tablet (0.4 mg total) under the tongue every 5 (five) minutes as needed. 08/07/11   Josue Hector, MD  oxybutynin (DITROPAN) 5 MG tablet Take 2.5 mg by mouth 3 (three) times daily.  12/23/13   Historical Provider, MD  RAPAFLO 8 MG CAPS capsule TAKE 1 CAPSULE (8 MG TOTAL) BY MOUTH DAILY WITH BREAKFAST. FOR PROSTATE    Susy Frizzle, MD  torsemide (DEMADEX) 20 MG tablet Take 1 tablet (20 mg total) by mouth 2 (two) times daily. 02/11/14   Josue Hector, MD   BP 106/61 mmHg  Pulse 81  Temp(Src) 97.6 F (36.4 C) (Oral)  Resp 13  SpO2 95% Physical Exam  Constitutional: He appears well-developed and well-nourished.  HENT:  Head: Normocephalic and atraumatic.  Eyes: EOM are normal. Pupils are equal, round, and reactive to light.  Neck: Normal range of motion. Neck supple.  Cardiovascular: Normal rate, regular rhythm and normal heart sounds.   Pulmonary/Chest: Effort normal and breath sounds normal.  Abdominal: Soft. Bowel sounds are normal. He exhibits no distension and no mass. There is no tenderness. There is no rebound and no guarding.  Musculoskeletal: Normal range of motion.       Cervical back: He exhibits normal range of motion, no tenderness, no bony tenderness, no swelling, no edema and no deformity.       Thoracic back: He exhibits normal range of motion, no tenderness, no bony tenderness, no swelling, no edema and no deformity.       Lumbar back: He exhibits normal range of motion, no tenderness, no bony tenderness, no swelling, no edema and no deformity.  Neurological: He is alert. He has normal strength. No cranial nerve deficit or sensory deficit.  Fine tremor of left hand, which family reports is baseline.  Skin: Skin is warm and dry.  Psychiatric: He has a normal mood and affect.   Nursing note and vitals reviewed.   ED Course  Procedures (including critical care time) Labs Review Labs Reviewed - No data to display  Imaging Review Ct Head Wo Contrast  03/04/2014   CLINICAL DATA:  Parkinson's syndrome. Post fall with no loss of consciousness. Injury to top of head.  EXAM: CT HEAD WITHOUT CONTRAST  TECHNIQUE: Contiguous axial images were obtained from the base of the skull through the vertex without intravenous contrast.  COMPARISON:  MRI 12/31/2013.  FINDINGS: Ventricles, cisterns and other  CSF spaces are within normal. There is no mass, mass effect, shift of midline structures or acute hemorrhage. No evidence of acute infarction. Remaining bones and soft tissues are unremarkable.  IMPRESSION: No acute intracranial findings.   Electronically Signed   By: Marin Olp M.D.   On: 03/04/2014 14:26     EKG Interpretation None      MDM   Final diagnoses:  Fall   Patient presents today after a mechanical fall.  He hit his head on a cabinet when he fell.  No LOC.  He is currently on Aggrenox.  No focal neurological deficits.  CT head is negative.  No spinal tenderness.  Full ROM of all extremities without pain.  He denies SOB, nausea, vomiting, vision changes, chest pain, or any other pain.  Feel that the patient is stable for discharge.  Return precautions given.    Hyman Bible, PA-C 03/04/14 [place], PA-C 03/04/14 1511  Orlie Dakin, MD 03/04/14 [phone]

## 2014-03-04 NOTE — Therapy (Signed)
[facility] [address] [place], [place], [address] Phone: [phone]   Fax:  [phone]  Physical Therapy Treatment  Patient Details  Name: Jeremy Terry MRN: [id number] Date of Birth: Mar 02, 1945 Referring Provider:  Susy Frizzle, MD  Encounter Date: 03/04/2014      PT End of Session - 03/04/14 1211    Visit Number 10   Number of Visits 17   Date for PT Re-Evaluation 03/31/14   PT Start Time 1110   PT Stop Time 1153   PT Time Calculation (min) 43 min   Equipment Utilized During Treatment Gait belt   Activity Tolerance Patient limited by lethargy   Behavior During Therapy Flat affect      Past Medical History  Diagnosis Date  . Coronary artery disease   . Hypertension   . Diabetes mellitus   . Hypercholesteremia   . BPH (benign prostatic hypertrophy)   . DJD (degenerative joint disease)   . Asthma   . Memory loss   . Ventral hernia   . GERD (gastroesophageal reflux disease)   . Allergic rhinitis   . Parkinsonian syndrome   . Hypogonadism male   . Neuropathy of leg     Bilateral  . Depression   . Parkinson disease   . NAION (non-arteritic anterior ischemic optic neuropathy), right eye     Followed by Dr. Katy Fitch    Past Surgical History  Procedure Laterality Date  . Coronary stent placement  2003  . Shoulder surgery  2011    rotator cuff    BP 106/66 mmHg  Pulse 90  Visit Diagnosis:  Abnormality of gait  Abnormal posture      Subjective Assessment - 03/04/14 1114    Symptoms Pt fell this morning while carrying dog not using walker.  Denies injury other than "bump" on head.  Wife states she almost took him to the ER as his head was hurting initially beut denies pain presently.   Currently in Pain? No/denies                    [facility] Adult PT Treatment/Exercise - 03/04/14 1205    Transfers   Transfers Sit to Stand;Stand to Sit   Sit to Stand 4: Min guard;From elevated  surface;With upper extremity assist;Other/comment  cues for hand placement   Stand to Sit 4: Min guard;With upper extremity assist;With armrests;Other (comment)  cues for hand placement   Ambulation/Gait   Ambulation/Gait Yes   Ambulation/Gait Assistance 4: Min guard   Ambulation/Gait Assistance Details cues for increased step length on R and decreasing speed   Ambulation Distance (Feet) 110 Feet  three times   Assistive device Rollator   Gait Pattern Decreased step length - right;Decreased stance time - right;Decreased weight shift to right;Decreased dorsiflexion - right;Trunk flexed;Wide base of support;Poor foot clearance - right;Poor foot clearance - left            LSVT [facility] - 03/04/14 1207    Step and Reach Forward Other reps (comment)  15 with UE support   Step and Reach Backward Other reps (comment)  15 with UE support   Step and Reach Sideways Other reps (comment)  15 with UE support            PT Education - 03/04/14 1211    Education provided Yes   Education Details Recommended for wife to take patient to ED secondary to fall this am in which pt hit his  head and c/o dizziness currently.   Person(s) Educated Patient;Spouse   Methods Explanation          PT Short Term Goals - 02/24/14 0944    PT SHORT TERM GOAL #1   Title Pt will perform HEP with family/caregiver supervision, to improve transfers, balance. (Target:  02/28/14)   Status Partially Met  caregiver and pt require some cueing for correct performance. Pt is doing HEP at home   PT SHORT TERM GOAL #2   Title perform at least 8 of 10 reps of sit<>stand transfers with minimal UE support and no posterior lean, for improved safety and efficiency with transfers.   Status Partially Met  pt performs 1 rep without UE support, then 10 reps with BUE support without retropulsion or posterior lean.    PT SHORT TERM GOAL #3   Title improve Berg Balance score to at least 38/56 for decreased fall risk.   Status  Achieved  Scored 43/56 on 02/24/14   PT SHORT TERM GOAL #4   Title improve Timed Up and Go score to less than or equal to 20 seconds for decreased fall risk.   Status Not Met  35.72 seconds on 02/24/14           PT Long Term Goals - 02/05/14 1201    PT LONG TERM GOAL #1   Title verbalize/demonstrate techniques for fall prevention within home environment.   Status New   PT LONG TERM GOAL #2   Title perform at least 4 of 5 reps of sit<>stand transfers with no UE support, no posterior lean, for improved safety and efficiency with transfers.   Status New   PT LONG TERM GOAL #3   Title improve Berg Balance score to at least 43/56 for decreased fall risk.   Status New   PT LONG TERM GOAL #4   Title improve TUG score to less than or equal to 15 seconds for decreased fall risk.   Status New   PT LONG TERM GOAL #5   Title improve gait velocity to at least 2.62 ft/sec for improved gait efficiency and safety.   Status New               Plan - 03/29/14 1212    Clinical Impression Statement Pt c/o dizziness with standing activities and took BP.  Wife reports fall this am and pt hit top of his head with visible bump.  Had headache/pain after fall but denies at present.  Encouraged wife to take pt to ED secondary to fall.  Wife agreed.  Pt states he does not feel as coordinated today.   Pt will benefit from skilled therapeutic intervention in order to improve on the following deficits Abnormal gait;Decreased activity tolerance;Decreased balance;Decreased cognition;Decreased mobility;Decreased knowledge of use of DME;Decreased range of motion;Decreased safety awareness;Decreased strength;Difficulty walking;Other (comment);Postural dysfunction   Rehab Potential Good   Clinical Impairments Affecting Rehab Potential Cognition   PT Frequency 2x / week   PT Duration 8 weeks   PT Treatment/Interventions ADLs/Self Care Home Management;Functional mobility training;Gait training;DME  Instruction;Therapeutic activities;Therapeutic exercise;Balance training;Neuromuscular re-education;Patient/family education   PT Next Visit Plan Wife to schedule 2 additional weeks of PT for 3 more weeks total per POC.  Continue balance and gait.   Consulted and Agree with Plan of Care Patient;Family member/caregiver          G-Codes - 2014/03/29 1216    Functional Assessment Tool Used BERG 43/56, TUG 35.72 sec with Rollator   Functional Limitation Mobility:  Walking and moving around   Mobility: Walking and Moving Around Current Status [phone]) At least 60 percent but less than 80 percent impaired, limited or restricted   Mobility: Walking and Moving Around Goal Status [phone]) At least 60 percent but less than 80 percent impaired, limited or restricted      Problem List Patient Active Problem List   Diagnosis Date Noted  . Edema 02/11/2014  . NAION (non-arteritic anterior ischemic optic neuropathy), right eye   . Diabetic neuropathy 08/08/2012  . Parkinson's disease 11/13/2011  . ESSENTIAL HYPERTENSION, BENIGN 05/08/2010  . HYPERCHOLESTEROLEMIA 04/03/2010  . Essential hypertension 04/03/2010  . Coronary atherosclerosis 04/03/2010  . ASTHMA, CHILDHOOD 04/03/2010  . GERD 04/03/2010  . VENTRAL HERNIA 04/03/2010  . HIATAL HERNIA 04/03/2010  . BPH (benign prostatic hypertrophy) 04/03/2010  . DEGENERATIVE JOINT DISEASE 04/03/2010  . MEMORY LOSS 04/03/2010  . DIABETES MELLITUS, BORDERLINE 04/03/2010    MARRIOTT,AMY W. 03/04/2014, 1:12 PM   Treatment performed by Nita Sells, LPTA, with G-code completed and signed by Mady Haagensen, PT 03/04/2014 1:13 PM Phone: [phone] Fax: [facility] [address] [place], [place], [address] Phone: [phone]   Fax:  [phone]

## 2014-03-05 ENCOUNTER — Encounter: Payer: Self-pay | Admitting: Family Medicine

## 2014-03-08 ENCOUNTER — Ambulatory Visit (INDEPENDENT_AMBULATORY_CARE_PROVIDER_SITE_OTHER): Payer: Medicare Other | Admitting: Family Medicine

## 2014-03-08 VITALS — BP 138/72 | HR 78 | Temp 97.7°F | Resp 16 | Wt 238.0 lb

## 2014-03-08 DIAGNOSIS — R42 Dizziness and giddiness: Secondary | ICD-10-CM

## 2014-03-08 DIAGNOSIS — I951 Orthostatic hypotension: Secondary | ICD-10-CM

## 2014-03-08 DIAGNOSIS — E1143 Type 2 diabetes mellitus with diabetic autonomic (poly)neuropathy: Secondary | ICD-10-CM

## 2014-03-08 LAB — URINALYSIS, ROUTINE W REFLEX MICROSCOPIC
Bilirubin Urine: NEGATIVE
Glucose, UA: NEGATIVE mg/dL
HGB URINE DIPSTICK: NEGATIVE
KETONES UR: NEGATIVE mg/dL
Leukocytes, UA: NEGATIVE
NITRITE: NEGATIVE
PH: 5.5 (ref 5.0–8.0)
Protein, ur: NEGATIVE mg/dL
Specific Gravity, Urine: 1.01 (ref 1.005–1.030)
UROBILINOGEN UA: 0.2 mg/dL (ref 0.0–1.0)

## 2014-03-08 NOTE — Patient Instructions (Signed)
We will call with lab results Stop the Lasix F/U as scheduled

## 2014-03-08 NOTE — Assessment & Plan Note (Signed)
Noted, d/c lasix, hold demadex for tonight, check lytes

## 2014-03-08 NOTE — Assessment & Plan Note (Signed)
Check A1C make sure glucose is doing okay and is not overtreated

## 2014-03-08 NOTE — Progress Notes (Signed)
Patient ID: Jeremy Terry, male   DOB: 12-Mar-1945, 69 y.o.   MRN: [id number]   Subjective:    Patient ID: Jeremy Terry, male    DOB: 1945-05-01, 69 y.o.   MRN: [id number]  Patient presents for Dizziness patient here with dizzy spells for the past 4 days. He actually had a severe dizzy spell losses balance which he has gait instability at baseline because of his Parkinson's disease. He hit his head and his wife to come to the emergency room they did a CT scan of his brain which was negative for any acute bleed but there was no blood work or anything else done. She also noted that his blood pressure has been dropping lower recently. On review of medications he's been taking 2 diuretics both Helene Kelp my as well as Lasix Lasix was supposed be discontinued after his last cardiology visit but his wife was not aware of this. She states that he states he had been very thirsty and he was drinking more fluids recently. He has not had any fever or congestion. He has not had any headaches there's been no change in his neurological status. He has severe Parkinson's at baseline with mood disorder and gait problems which he is currently in physical therapy for.  Has history of diabetes mellitus is currently on glipizide he is due for repeat A1c his blood sugars have been 125-136 Review Of Systems:  GEN- denies fatigue, fever, weight loss,weakness, recent illness HEENT- denies eye drainage, change in vision, nasal discharge, CVS- denies chest pain, palpitations RESP- denies SOB, cough, wheeze ABD- denies N/V, change in stools, abd pain GU- denies dysuria, hematuria, dribbling, incontinence MSK- denies joint pain, muscle aches, injury Neuro- denies headache, dizziness, syncope, seizure activity       Objective:    BP 138/72 mmHg  Pulse 78  Temp(Src) 97.7 F (36.5 C) (Oral)  Resp 16  Wt 238 lb (107.956 kg) GEN- NAD, alert and oriented x3 HEENT- PERRL, EOMI, non injected sclera, pink conjunctiva, MMM,  oropharynx clear Neck- Supple, no JVD CVS- RRR, no murmur RESP-CTAB ABD-NABS,soft,NT,ND Neuro- gait instability, slow and shuffles, purposeful tremor, slow speech, legally blind in right eye EXT- pedal  edema Pulses- Radial 2+   Orthostatic blood pressures lying 138/72 sitting 118/62 standing 118/60       Assessment & Plan:      Problem List Items Addressed This Visit      Unprioritized   Diabetes mellitus with neurological manifestation    Check A1C make sure glucose is doing okay and is not overtreated    Relevant Orders      Hemoglobin A1c    Other Visit Diagnoses    Dizzy spells    -  Primary    Orthostatic hypotension noted, d/c lasix, wife will check BP, check lytes, UA, CT recently negative, no new focal deficits or HA, does not sound like veritgo    Relevant Orders       CBC with Differential       Comprehensive metabolic panel       Urinalysis, Routine w reflex microscopic (Completed)       Note: This dictation was prepared with Dragon dictation along with smaller phrase technology. Any transcriptional errors that result from this process are unintentional.

## 2014-03-09 ENCOUNTER — Ambulatory Visit: Payer: Medicare Other | Admitting: Cardiovascular Disease

## 2014-03-09 ENCOUNTER — Ambulatory Visit: Payer: Medicare Other | Admitting: Physical Therapy

## 2014-03-09 ENCOUNTER — Telehealth: Payer: Self-pay | Admitting: Family Medicine

## 2014-03-09 ENCOUNTER — Encounter: Payer: Self-pay | Admitting: Physical Therapy

## 2014-03-09 DIAGNOSIS — R293 Abnormal posture: Secondary | ICD-10-CM

## 2014-03-09 DIAGNOSIS — R269 Unspecified abnormalities of gait and mobility: Secondary | ICD-10-CM

## 2014-03-09 LAB — COMPREHENSIVE METABOLIC PANEL
ALT: 16 U/L (ref 0–53)
AST: 21 U/L (ref 0–37)
Albumin: 3.8 g/dL (ref 3.5–5.2)
Alkaline Phosphatase: 66 U/L (ref 39–117)
BUN: 20 mg/dL (ref 6–23)
CALCIUM: 9.2 mg/dL (ref 8.4–10.5)
CHLORIDE: 100 meq/L (ref 96–112)
CO2: 33 meq/L — AB (ref 19–32)
Creat: 1.12 mg/dL (ref 0.50–1.35)
Glucose, Bld: 118 mg/dL — ABNORMAL HIGH (ref 70–99)
POTASSIUM: 3.5 meq/L (ref 3.5–5.3)
Sodium: 140 mEq/L (ref 135–145)
Total Bilirubin: 0.6 mg/dL (ref 0.2–1.2)
Total Protein: 6.3 g/dL (ref 6.0–8.3)

## 2014-03-09 LAB — CBC WITH DIFFERENTIAL/PLATELET
Basophils Absolute: 0 10*3/uL (ref 0.0–0.1)
Basophils Relative: 0 % (ref 0–1)
EOS ABS: 0.2 10*3/uL (ref 0.0–0.7)
Eosinophils Relative: 3 % (ref 0–5)
HEMATOCRIT: 43.6 % (ref 39.0–52.0)
Hemoglobin: 14.9 g/dL (ref 13.0–17.0)
LYMPHS ABS: 1.4 10*3/uL (ref 0.7–4.0)
Lymphocytes Relative: 20 % (ref 12–46)
MCH: 32.4 pg (ref 26.0–34.0)
MCHC: 34.2 g/dL (ref 30.0–36.0)
MCV: 94.8 fL (ref 78.0–100.0)
MPV: 10 fL (ref 8.6–12.4)
Monocytes Absolute: 0.8 10*3/uL (ref 0.1–1.0)
Monocytes Relative: 11 % (ref 3–12)
NEUTROS ABS: 4.6 10*3/uL (ref 1.7–7.7)
Neutrophils Relative %: 66 % (ref 43–77)
Platelets: 231 10*3/uL (ref 150–400)
RBC: 4.6 MIL/uL (ref 4.22–5.81)
RDW: 13.9 % (ref 11.5–15.5)
WBC: 6.9 10*3/uL (ref 4.0–10.5)

## 2014-03-09 LAB — HEMOGLOBIN A1C
HEMOGLOBIN A1C: 6.8 % — AB (ref <5.7)
MEAN PLASMA GLUCOSE: 148 mg/dL — AB (ref <117)

## 2014-03-09 NOTE — Therapy (Signed)
[facility] [address] [place], [place], [address] Phone: [phone]   Fax:  [phone]  Physical Therapy Treatment  Patient Details  Name: Jeremy Terry MRN: [id number] Date of Birth: 1946/02/08 Referring Provider:  Susy Frizzle, MD  Encounter Date: 03/09/2014      PT End of Session - 03/10/14 1316    Visit Number 11   Number of Visits 17   Date for PT Re-Evaluation 03/31/14   PT Start Time 1020   PT Stop Time 1100   PT Time Calculation (min) 40 min   Equipment Utilized During Treatment Gait belt      Past Medical History  Diagnosis Date  . Coronary artery disease   . Hypertension   . Diabetes mellitus   . Hypercholesteremia   . BPH (benign prostatic hypertrophy)   . DJD (degenerative joint disease)   . Asthma   . Memory loss   . Ventral hernia   . GERD (gastroesophageal reflux disease)   . Allergic rhinitis   . Parkinsonian syndrome   . Hypogonadism male   . Neuropathy of leg     Bilateral  . Depression   . Parkinson disease   . NAION (non-arteritic anterior ischemic optic neuropathy), right eye     Followed by Dr. Katy Fitch    Past Surgical History  Procedure Laterality Date  . Coronary stent placement  2003  . Shoulder surgery  2011    rotator cuff    There were no vitals taken for this visit.  Visit Diagnosis:  Abnormality of gait  Abnormal posture      Subjective Assessment - 03/09/14 1023    Symptoms Wife reports taking patient to ED-no changes, everything okay per ED visit.  Reports he feels "dizzy", started approximately 3-4 weeks ago with this sensation gradually getting more significant.  Wife reports drop in BP from lying down to standing in doctor's office.     Currently in Pain? No/denies        Objective: Sit<>stand transfers from 22 inch height x 10 reps, then from 18 inch height x 10 reps with min guard and cues for forward lean and slow, controlled descent into  sitting.  Seated lower extremity exercises for general ROM, flexibility, instructed to perform if pt has been sitting for long periods:  Ankle pumps, LAQ, then seated marching 2 sets x 10 reps each.  Sitting BP:  120/68, followed by standing BP 134/71.  Asked pt to describe dizziness-lightheadedness versus room spinning versus unbalanced.  Pt has hard time describing dizziness.   He c/o dizziness at end of standing exercises and after reps of sit<>stand.  Difficult to assess if related more to balance/unsteadiness or potential drop in blood pressure, as pt is not good descriptor of symptoms.  He does state that the room is not spinning.    Standing balance activities at counter:  Backward step and weight shift x 15 reps each leg, with cues given for deliberate, effortful pattern with weightshifting.  Standing at counter, with buttocks near counter, with anterior/posterior weightshifting for improved hip/ankle strategy, x 15 reps.  Turning clockwise and counterclockwise for positioning with exercises and to sit, with cues for large sidestepping to turn.  After completion of standing exercises, PT provides cues for pt to focus on visual target to settle dizziness.                    PT Education - 03/10/14 1315  Education provided Yes   Education Details Discussed symptoms of orthostatic hypotension and how that might relate to dizziness (wife reports physician doing work-up) in regards to mobility.  Discussed lower extremity exercises prior to standing   Person(s) Educated Patient;Spouse   Methods Explanation;Demonstration   Comprehension Verbalized understanding;Returned demonstration          PT Short Term Goals - 02/24/14 0944    PT SHORT TERM GOAL #1   Title Pt will perform HEP with family/caregiver supervision, to improve transfers, balance. (Target:  02/28/14)   Status Partially Met  caregiver and pt require some cueing for correct performance. Pt is doing HEP at home   PT  SHORT TERM GOAL #2   Title perform at least 8 of 10 reps of sit<>stand transfers with minimal UE support and no posterior lean, for improved safety and efficiency with transfers.   Status Partially Met  pt performs 1 rep without UE support, then 10 reps with BUE support without retropulsion or posterior lean.    PT SHORT TERM GOAL #3   Title improve Berg Balance score to at least 38/56 for decreased fall risk.   Status Achieved  Scored 43/56 on 02/24/14   PT SHORT TERM GOAL #4   Title improve Timed Up and Go score to less than or equal to 20 seconds for decreased fall risk.   Status Not Met  35.72 seconds on 02/24/14           PT Long Term Goals - 02/05/14 1201    PT LONG TERM GOAL #1   Title verbalize/demonstrate techniques for fall prevention within home environment.   Status New   PT LONG TERM GOAL #2   Title perform at least 4 of 5 reps of sit<>stand transfers with no UE support, no posterior lean, for improved safety and efficiency with transfers.   Status New   PT LONG TERM GOAL #3   Title improve Berg Balance score to at least 43/56 for decreased fall risk.   Status New   PT LONG TERM GOAL #4   Title improve TUG score to less than or equal to 15 seconds for decreased fall risk.   Status New   PT LONG TERM GOAL #5   Title improve gait velocity to at least 2.62 ft/sec for improved gait efficiency and safety.   Status New               Plan - 03/10/14 1317    Clinical Impression Statement Pt appears to be less lethargic than usual today, with improved participation in therapy activities.  Pt continues to need max cueing for effortful, large amplitude movement activities.   Pt will benefit from skilled therapeutic intervention in order to improve on the following deficits Abnormal gait;Decreased activity tolerance;Decreased balance;Decreased cognition;Decreased mobility;Decreased knowledge of use of DME;Decreased range of motion;Decreased safety awareness;Decreased  strength;Difficulty walking;Other (comment);Postural dysfunction   Rehab Potential Good   Clinical Impairments Affecting Rehab Potential Cognition   PT Frequency 2x / week   PT Duration 8 weeks  wk 6 of 8   PT Treatment/Interventions ADLs/Self Care Home Management;Functional mobility training;Gait training;DME Instruction;Therapeutic activities;Therapeutic exercise;Balance training;Neuromuscular re-education;Patient/family education   PT Next Visit Plan balance, gait activities, weight shifting at counter, try Modified quadruped PWR! moves at counter        Problem List Patient Active Problem List   Diagnosis Date Noted  . Orthostatic hypotension 03/08/2014  . Edema 02/11/2014  . NAION (non-arteritic anterior ischemic optic neuropathy), right eye   .  Diabetic neuropathy 08/08/2012  . Parkinson's disease 11/13/2011  . ESSENTIAL HYPERTENSION, BENIGN 05/08/2010  . HYPERCHOLESTEROLEMIA 04/03/2010  . Essential hypertension 04/03/2010  . Coronary atherosclerosis 04/03/2010  . ASTHMA, CHILDHOOD 04/03/2010  . GERD 04/03/2010  . VENTRAL HERNIA 04/03/2010  . HIATAL HERNIA 04/03/2010  . BPH (benign prostatic hypertrophy) 04/03/2010  . DEGENERATIVE JOINT DISEASE 04/03/2010  . MEMORY LOSS 04/03/2010  . Diabetes mellitus with neurological manifestation 04/03/2010    Sahvanna Mcmanigal W. 03/10/2014, 1:21 PM  Jahzion Brogden Gerrit Friends, PT 03/10/2014 1:22 PM Phone: [phone] Fax: [facility] [address] [place], [place], [address] Phone: [phone]   Fax:  [phone]

## 2014-03-09 NOTE — Telephone Encounter (Signed)
Patients wife aware of results.

## 2014-03-09 NOTE — Telephone Encounter (Signed)
[phone] Pt wife is calling to get results from husbands lab work yesterday

## 2014-03-11 ENCOUNTER — Ambulatory Visit: Payer: Medicare Other | Admitting: Cardiovascular Disease

## 2014-03-12 ENCOUNTER — Encounter: Payer: Self-pay | Admitting: Physical Therapy

## 2014-03-12 ENCOUNTER — Ambulatory Visit: Payer: Medicare Other | Admitting: Physical Therapy

## 2014-03-12 DIAGNOSIS — R293 Abnormal posture: Secondary | ICD-10-CM

## 2014-03-12 DIAGNOSIS — R269 Unspecified abnormalities of gait and mobility: Secondary | ICD-10-CM

## 2014-03-12 NOTE — Therapy (Signed)
[facility] [address] [place], [place], [address] Phone: [phone]   Fax:  [phone]  Physical Therapy Treatment  Patient Details  Name: Jeremy Terry MRN: [id number] Date of Birth: 08-09-1945 Referring Provider:  Susy Frizzle, MD  Encounter Date: 03/12/2014      PT End of Session - 03/12/14 1109    Visit Number 12   Number of Visits 17   Date for PT Re-Evaluation 03/31/14   PT Start Time 1016   PT Stop Time 1100   PT Time Calculation (min) 44 min   Equipment Utilized During Treatment Gait belt   Activity Tolerance Patient tolerated treatment well   Behavior During Therapy Flat affect      Past Medical History  Diagnosis Date  . Coronary artery disease   . Hypertension   . Diabetes mellitus   . Hypercholesteremia   . BPH (benign prostatic hypertrophy)   . DJD (degenerative joint disease)   . Asthma   . Memory loss   . Ventral hernia   . GERD (gastroesophageal reflux disease)   . Allergic rhinitis   . Parkinsonian syndrome   . Hypogonadism male   . Neuropathy of leg     Bilateral  . Depression   . Parkinson disease   . NAION (non-arteritic anterior ischemic optic neuropathy), right eye     Followed by Dr. Katy Fitch    Past Surgical History  Procedure Laterality Date  . Coronary stent placement  2003  . Shoulder surgery  2011    rotator cuff    There were no vitals taken for this visit.  Visit Diagnosis:  Abnormality of gait  Abnormal posture      Subjective Assessment - 03/12/14 1018    Symptoms Pt reports feeling "off" today-pretty dizzy this morning.  Wife reports that results from physician didn't show anything signficant.     Currently in Pain? No/denies     Objective: Neuro Reeducation:  Standing at walker upon initial standing-lateral weightshifting prior to ambulation x 5 reps, then at counter, lateral weightshifting with bilateral UE support, then weightshifting laterally  with UE reaches x 10 reps, with tactile and verbal cues for technique.  Lateral weightshifting x 10 reps with lifting one leg at a time, to work on improved single limb stance for balance, with verbal and tactile cues.  Stagger standing foot position (semitandem) with anterior/posterior weightshifting x 15 reps with UE support.   Single limb stance at 6 inch step:  Consecutive step taps x 10 reps each leg, then alternating steps each leg x 10 reps; alternating steps x 10 reps to 12 inch step with bilateral UE support, with cues for increased deliberate, large amplitude movement.  With mirror for visual cues and 1 UE supported at counter:  Stepping over bolster, then stepping over hurdle, 2 sets x 10 reps each with cues for maximal deliberate effort to clear obstacle.    Modified quadruped PWR! Position at counter-PWR! Up for posture x 10 reps, with tactile and verbal cues for scapular retraction, PWR! Rock (anterior/posterior) for improved weightshifting x 10 reps, PWR! Step x 10 reps with verbal cues for foot clearance.  Gait:  Gait training with short bouts of gait throughout PT session, 100 ft. X 6 reps, including turning to sit, using 4-wheeled rollator walker, with supervision and cues for RLE foot clearance.  PT provides cues for slowing down ahead of turning to sit for improved safety.  PT provides cues and pt  performs lateral weightshifting prior to initiating gait for improved safety with initial gait.                       PT Education - 03/12/14 1108    Education provided Yes   Education Details Educated in utilizing lateral weightshifting strategy prior to initiating gait as well as need for verbal and possible tactile cues for slowing gait prior to turning to sit for improved safety.   Person(s) Educated Patient;Spouse   Methods Explanation;Demonstration   Comprehension Verbalized understanding;Verbal cues required          PT Short Term Goals - 02/24/14 0944    PT  SHORT TERM GOAL #1   Title Pt will perform HEP with family/caregiver supervision, to improve transfers, balance. (Target:  02/28/14)   Status Partially Met  caregiver and pt require some cueing for correct performance. Pt is doing HEP at home   PT SHORT TERM GOAL #2   Title perform at least 8 of 10 reps of sit<>stand transfers with minimal UE support and no posterior lean, for improved safety and efficiency with transfers.   Status Partially Met  pt performs 1 rep without UE support, then 10 reps with BUE support without retropulsion or posterior lean.    PT SHORT TERM GOAL #3   Title improve Berg Balance score to at least 38/56 for decreased fall risk.   Status Achieved  Scored 43/56 on 02/24/14   PT SHORT TERM GOAL #4   Title improve Timed Up and Go score to less than or equal to 20 seconds for decreased fall risk.   Status Not Met  35.72 seconds on 02/24/14           PT Long Term Goals - 02/05/14 1201    PT LONG TERM GOAL #1   Title verbalize/demonstrate techniques for fall prevention within home environment.   Status New   PT LONG TERM GOAL #2   Title perform at least 4 of 5 reps of sit<>stand transfers with no UE support, no posterior lean, for improved safety and efficiency with transfers.   Status New   PT LONG TERM GOAL #3   Title improve Berg Balance score to at least 43/56 for decreased fall risk.   Status New   PT LONG TERM GOAL #4   Title improve TUG score to less than or equal to 15 seconds for decreased fall risk.   Status New   PT LONG TERM GOAL #5   Title improve gait velocity to at least 2.62 ft/sec for improved gait efficiency and safety.   Status New               Plan - 03/12/14 1110    Clinical Impression Statement Pt responds well to use of mirror as visual and postural cue for improved foot clearance with single limb stance activities.  Pt will continue to benefit from further skilled PT to further address balance, gait and transfers.   Pt will  benefit from skilled therapeutic intervention in order to improve on the following deficits Abnormal gait;Decreased activity tolerance;Decreased balance;Decreased cognition;Decreased mobility;Decreased knowledge of use of DME;Decreased range of motion;Decreased safety awareness;Decreased strength;Difficulty walking;Other (comment);Postural dysfunction   Rehab Potential Good   Clinical Impairments Affecting Rehab Potential Cognition   PT Frequency 2x / week   PT Duration 8 weeks  wk 6 of 8   PT Treatment/Interventions ADLs/Self Care Home Management;Functional mobility training;Gait training;DME Instruction;Therapeutic activities;Therapeutic exercise;Balance training;Neuromuscular re-education;Patient/family education  PT Next Visit Plan continue single limb stance activities, weightshifting activities, modified quadruped PWR! moves at [employer] and Agree with Plan of Care Patient;Family member/caregiver        Problem List Patient Active Problem List   Diagnosis Date Noted  . Orthostatic hypotension 03/08/2014  . Edema 02/11/2014  . NAION (non-arteritic anterior ischemic optic neuropathy), right eye   . Diabetic neuropathy 08/08/2012  . Parkinson's disease 11/13/2011  . ESSENTIAL HYPERTENSION, BENIGN 05/08/2010  . HYPERCHOLESTEROLEMIA 04/03/2010  . Essential hypertension 04/03/2010  . Coronary atherosclerosis 04/03/2010  . ASTHMA, CHILDHOOD 04/03/2010  . GERD 04/03/2010  . VENTRAL HERNIA 04/03/2010  . HIATAL HERNIA 04/03/2010  . BPH (benign prostatic hypertrophy) 04/03/2010  . DEGENERATIVE JOINT DISEASE 04/03/2010  . MEMORY LOSS 04/03/2010  . Diabetes mellitus with neurological manifestation 04/03/2010    Khyrie Masi W. 03/12/2014, 11:14 AM   Mady Haagensen, PT 03/12/2014 11:14 AM Phone: [phone] Fax: [facility] [address] [place], [place], [address] Phone: [phone]   Fax:   [phone]

## 2014-03-17 ENCOUNTER — Ambulatory Visit: Payer: Medicare Other | Admitting: Physical Therapy

## 2014-03-17 ENCOUNTER — Encounter: Payer: Self-pay | Admitting: Cardiovascular Disease

## 2014-03-17 DIAGNOSIS — R269 Unspecified abnormalities of gait and mobility: Secondary | ICD-10-CM | POA: Diagnosis not present

## 2014-03-17 DIAGNOSIS — R293 Abnormal posture: Secondary | ICD-10-CM

## 2014-03-17 NOTE — Therapy (Signed)
[facility] [address] [place], [place], [address] Phone: [phone]   Fax:  [phone]  Physical Therapy Treatment  Patient Details  Name: Jeremy Terry MRN: [id number] Date of Birth: 06-02-1945 Referring Provider:  Susy Frizzle, MD  Encounter Date: 03/17/2014      PT End of Session - 03/18/14 1424    Visit Number 13   Number of Visits 17   Date for PT Re-Evaluation 03/31/14   PT Start Time 0935   PT Stop Time 1016   PT Time Calculation (min) 41 min   Equipment Utilized During Treatment Gait belt   Activity Tolerance Patient tolerated treatment well      Past Medical History  Diagnosis Date  . Coronary artery disease   . Hypertension   . Diabetes mellitus   . Hypercholesteremia   . BPH (benign prostatic hypertrophy)   . DJD (degenerative joint disease)   . Asthma   . Memory loss   . Ventral hernia   . GERD (gastroesophageal reflux disease)   . Allergic rhinitis   . Parkinsonian syndrome   . Hypogonadism male   . Neuropathy of leg     Bilateral  . Depression   . Parkinson disease   . NAION (non-arteritic anterior ischemic optic neuropathy), right eye     Followed by Dr. Katy Fitch    Past Surgical History  Procedure Laterality Date  . Coronary stent placement  2003  . Shoulder surgery  2011    rotator cuff    There were no vitals taken for this visit.  Visit Diagnosis:  Abnormality of gait  Abnormal posture      Subjective Assessment - 03/17/14 0939    Symptoms Pt reports falling backwards.  Wife reports he is unable to stop himself when he starts going backwards.   Currently in Pain? No/denies      Standing lateral weightshifting activities, 3 sets x  10 reps with progression from legs only to legs combined with UE reaching (with verbal and tactile cues).  Semi-tandem stance anterior/posterior rocking and weightshifting 3 sets x 10 reps, with cues to slow pace and hold each position  2-3 seconds.  Verbal cues provided for patient to improve deliberate movement, by feeling as if he is actively stopping himself from falling.  With mat placed on wall, practiced progression of posterior step and weightshift with minimal UE support at walker, with PT providing minimal assistance and verbal cues.  Pt performs side step and weightshfiting, progressing to varied direction perturbations given by PT in R, L and posterior directions, with min guard assistance from therapist, with several episodes of therapist providing minimal>moderate assistance to prevent loss of balance.                      PT Education - 03/18/14 1422    Education provided Yes   Education Details Discussed with wife/practiced with patient need for verbal cueing during step and weightshift exercises to help patient feel he is actively using step strategy for fall prevention   Person(s) Educated Patient;Spouse   Methods Explanation;Demonstration   Comprehension Verbalized understanding;Returned demonstration;Verbal cues required          PT Short Term Goals - 02/24/14 0944    PT SHORT TERM GOAL #1   Title Pt will perform HEP with family/caregiver supervision, to improve transfers, balance. (Target:  02/28/14)   Status Partially Met  caregiver and pt require some cueing for correct performance.  Pt is doing HEP at home   PT SHORT TERM GOAL #2   Title perform at least 8 of 10 reps of sit<>stand transfers with minimal UE support and no posterior lean, for improved safety and efficiency with transfers.   Status Partially Met  pt performs 1 rep without UE support, then 10 reps with BUE support without retropulsion or posterior lean.    PT SHORT TERM GOAL #3   Title improve Berg Balance score to at least 38/56 for decreased fall risk.   Status Achieved  Scored 43/56 on 02/24/14   PT SHORT TERM GOAL #4   Title improve Timed Up and Go score to less than or equal to 20 seconds for decreased fall risk.    Status Not Met  35.72 seconds on 02/24/14           PT Long Term Goals - 02/05/14 1201    PT LONG TERM GOAL #1   Title verbalize/demonstrate techniques for fall prevention within home environment.   Status New   PT LONG TERM GOAL #2   Title perform at least 4 of 5 reps of sit<>stand transfers with no UE support, no posterior lean, for improved safety and efficiency with transfers.   Status New   PT LONG TERM GOAL #3   Title improve Berg Balance score to at least 43/56 for decreased fall risk.   Status New   PT LONG TERM GOAL #4   Title improve TUG score to less than or equal to 15 seconds for decreased fall risk.   Status New   PT LONG TERM GOAL #5   Title improve gait velocity to at least 2.62 ft/sec for improved gait efficiency and safety.   Status New               Plan - 03/18/14 1425    Clinical Impression Statement Utilized progression of step strategy and balance perturbations to continue to work on balance recovery/fall prevention.  Pt performs with min guard assistance, but does tend to want to cross feet instead of having true stepping strategy.   Pt will continue to benefit from further skilled PT to address fall prevention and improved balance/gait.    Pt will benefit from skilled therapeutic intervention in order to improve on the following deficits Abnormal gait;Decreased activity tolerance;Decreased balance;Decreased cognition;Decreased mobility;Decreased knowledge of use of DME;Decreased range of motion;Decreased safety awareness;Decreased strength;Difficulty walking;Other (comment);Postural dysfunction   Rehab Potential Good   Clinical Impairments Affecting Rehab Potential Cognition   PT Frequency 2x / week   PT Duration 8 weeks  wk 7 of 8   PT Treatment/Interventions ADLs/Self Care Home Management;Functional mobility training;Gait training;DME Instruction;Therapeutic activities;Therapeutic exercise;Balance training;Neuromuscular  re-education;Patient/family education   PT Next Visit Plan varied direction balance perturbation and stepping; check goals, discuss D/C?   Consulted and Agree with Plan of Care Family member/caregiver   Family Member Consulted wife        Problem List Patient Active Problem List   Diagnosis Date Noted  . Orthostatic hypotension 03/08/2014  . Edema 02/11/2014  . NAION (non-arteritic anterior ischemic optic neuropathy), right eye   . Diabetic neuropathy 08/08/2012  . Parkinson's disease 11/13/2011  . ESSENTIAL HYPERTENSION, BENIGN 05/08/2010  . HYPERCHOLESTEROLEMIA 04/03/2010  . Essential hypertension 04/03/2010  . Coronary atherosclerosis 04/03/2010  . ASTHMA, CHILDHOOD 04/03/2010  . GERD 04/03/2010  . VENTRAL HERNIA 04/03/2010  . HIATAL HERNIA 04/03/2010  . BPH (benign prostatic hypertrophy) 04/03/2010  . DEGENERATIVE JOINT DISEASE 04/03/2010  . MEMORY  LOSS 04/03/2010  . Diabetes mellitus with neurological manifestation 04/03/2010    Frazier Butt. 03/18/2014, 2:28 PM  Lemond Griffee Gerrit Friends, PT 03/18/2014 2:28 PM Phone: [phone] Fax: [facility] [address] [place], [place], [address] Phone: [phone]   Fax:  [phone]

## 2014-03-18 ENCOUNTER — Emergency Department (HOSPITAL_COMMUNITY): Payer: Medicare Other

## 2014-03-18 ENCOUNTER — Emergency Department (HOSPITAL_COMMUNITY)
Admission: EM | Admit: 2014-03-18 | Discharge: 2014-03-18 | Disposition: A | Discharge status: Home / Self Care | Payer: Medicare Other | Attending: Emergency Medicine | Admitting: Emergency Medicine

## 2014-03-18 ENCOUNTER — Encounter (HOSPITAL_COMMUNITY): Payer: Self-pay | Admitting: *Deleted

## 2014-03-18 DIAGNOSIS — F329 Major depressive disorder, single episode, unspecified: Secondary | ICD-10-CM | POA: Diagnosis not present

## 2014-03-18 DIAGNOSIS — M1990 Unspecified osteoarthritis, unspecified site: Secondary | ICD-10-CM | POA: Diagnosis not present

## 2014-03-18 DIAGNOSIS — S5001XA Contusion of right elbow, initial encounter: Secondary | ICD-10-CM | POA: Insufficient documentation

## 2014-03-18 DIAGNOSIS — I10 Essential (primary) hypertension: Secondary | ICD-10-CM | POA: Diagnosis not present

## 2014-03-18 DIAGNOSIS — Z9861 Coronary angioplasty status: Secondary | ICD-10-CM | POA: Insufficient documentation

## 2014-03-18 DIAGNOSIS — J45909 Unspecified asthma, uncomplicated: Secondary | ICD-10-CM | POA: Insufficient documentation

## 2014-03-18 DIAGNOSIS — E780 Pure hypercholesterolemia: Secondary | ICD-10-CM | POA: Insufficient documentation

## 2014-03-18 DIAGNOSIS — I2510 Atherosclerotic heart disease of native coronary artery without angina pectoris: Secondary | ICD-10-CM | POA: Diagnosis not present

## 2014-03-18 DIAGNOSIS — Y998 Other external cause status: Secondary | ICD-10-CM | POA: Diagnosis not present

## 2014-03-18 DIAGNOSIS — S59901A Unspecified injury of right elbow, initial encounter: Secondary | ICD-10-CM | POA: Diagnosis present

## 2014-03-18 DIAGNOSIS — Z79899 Other long term (current) drug therapy: Secondary | ICD-10-CM | POA: Insufficient documentation

## 2014-03-18 DIAGNOSIS — Z791 Long term (current) use of non-steroidal anti-inflammatories (NSAID): Secondary | ICD-10-CM | POA: Insufficient documentation

## 2014-03-18 DIAGNOSIS — Z87891 Personal history of nicotine dependence: Secondary | ICD-10-CM | POA: Insufficient documentation

## 2014-03-18 DIAGNOSIS — W19XXXA Unspecified fall, initial encounter: Secondary | ICD-10-CM

## 2014-03-18 DIAGNOSIS — W01198A Fall on same level from slipping, tripping and stumbling with subsequent striking against other object, initial encounter: Secondary | ICD-10-CM | POA: Diagnosis not present

## 2014-03-18 DIAGNOSIS — G20 Parkinson's disease: Secondary | ICD-10-CM | POA: Diagnosis not present

## 2014-03-18 DIAGNOSIS — Z8719 Personal history of other diseases of the digestive system: Secondary | ICD-10-CM | POA: Diagnosis not present

## 2014-03-18 DIAGNOSIS — E119 Type 2 diabetes mellitus without complications: Secondary | ICD-10-CM | POA: Insufficient documentation

## 2014-03-18 DIAGNOSIS — Y9389 Activity, other specified: Secondary | ICD-10-CM | POA: Diagnosis not present

## 2014-03-18 DIAGNOSIS — Z87438 Personal history of other diseases of male genital organs: Secondary | ICD-10-CM | POA: Diagnosis not present

## 2014-03-18 DIAGNOSIS — Y92009 Unspecified place in unspecified non-institutional (private) residence as the place of occurrence of the external cause: Secondary | ICD-10-CM | POA: Insufficient documentation

## 2014-03-18 NOTE — ED Provider Notes (Signed)
CSN: [id number]     Arrival date & time 03/18/14  1245 History  This chart was scribed for Nat Christen, MD by Edison Simon, ED Scribe. This patient was seen in room APA12/APA12 and the patient's care was started at 1:29 PM.    Chief Complaint  Patient presents with  . Fall   The history is provided by the patient and the spouse. No language interpreter was used.    HPI Comments: Jeremy Terry is a 69 y.o. male with history of Parkinson's disease who presents to the Emergency Department complaining of fall at 0200 this morning. He states he was reaching to open the bedroom door and fell to the floor. Wife notes tenderness to his right elbow. He denies other injury or pain anywhere. He denies pain to his legs, hips, or neck. No head or neck trauma. Normal behavior  PCP: Odette Fraction, MD at [facility]  Past Medical History  Diagnosis Date  . Coronary artery disease   . Hypertension   . Diabetes mellitus   . Hypercholesteremia   . BPH (benign prostatic hypertrophy)   . DJD (degenerative joint disease)   . Asthma   . Memory loss   . Ventral hernia   . GERD (gastroesophageal reflux disease)   . Allergic rhinitis   . Parkinsonian syndrome   . Hypogonadism male   . Neuropathy of leg     Bilateral  . Depression   . Parkinson disease   . NAION (non-arteritic anterior ischemic optic neuropathy), right eye     Followed by Dr. Katy Fitch   Past Surgical History  Procedure Laterality Date  . Coronary stent placement  2003  . Shoulder surgery  2011    rotator cuff   Family History  Problem Relation Age of Onset  . Other Father     respiratory problems   History  Substance Use Topics  . Smoking status: Former Smoker    Quit date: 02/26/1998  . Smokeless tobacco: Never Used  . Alcohol Use: Yes     Comment: Occ    Review of Systems A complete 10 system review of systems was obtained and all systems are negative except as noted in the HPI and PMH.    Allergies  Morphine;  Hydrocodone; and Sulfa antibiotics  Home Medications   Prior to Admission medications   Medication Sig Start Date End Date Taking? Authorizing Provider  albuterol (PROVENTIL HFA;VENTOLIN HFA) 108 (90 BASE) MCG/ACT inhaler Inhale 2 puffs into the lungs every 6 (six) hours as needed for wheezing or shortness of breath. 05/01/13  Yes Susy Frizzle, MD  atorvastatin (LIPITOR) 40 MG tablet TAKE ONE TABLET BY MOUTH DAILY Patient taking differently: Take 40 mg by mouth daily at 6 PM.  08/13/13  Yes Josue Hector, MD  buPROPion (WELLBUTRIN XL) 150 MG 24 hr tablet Take 1 tablet (150 mg total) by mouth daily. 10/13/13  Yes Rebecca S Tat, DO  Carbidopa-Levodopa ER (RYTARY) 23.75-95 MG CPCR Take 4 tablets by mouth 3 (three) times daily. Take 4 tablets in am, 4 tablets in the afternoon, 3 tablets in the evening 08/25/13  Yes Rebecca S Tat, DO  dipyridamole-aspirin (AGGRENOX) 200-25 MG per 12 hr capsule Take 1 capsule by mouth 2 (two) times daily. 12/29/13  Yes Rebecca S Tat, DO  donepezil (ARICEPT) 10 MG tablet Take 1 tablet (10 mg total) by mouth at bedtime. 12/29/12  Yes Rebecca S Tat, DO  glipiZIDE (GLUCOTROL) 5 MG tablet TAKE 1 TABLET (5  MG TOTAL) BY MOUTH 2 (TWO) TIMES DAILY BEFORE A MEAL. 02/15/14  Yes Susy Frizzle, MD  losartan (COZAAR) 100 MG tablet TAKE 1 TABLET BY MOUTH EVERY DAY 07/09/13  Yes Susy Frizzle, MD  meloxicam (MOBIC) 15 MG tablet TAKE 1 TABLET BY MOUTH EVERY DAY 02/15/14  Yes Susy Frizzle, MD  metoprolol succinate (TOPROL-XL) 50 MG 24 hr tablet Take 1 tablet (50 mg total) by mouth daily. 09/30/12  Yes Josue Hector, MD  mirtazapine (REMERON) 15 MG tablet TAKE 1 TABLET BY MOUTH AT BEDTIME 01/04/14  Yes Rebecca S Tat, DO  Multiple Vitamin (MULTIVITAMIN) capsule Take 1 capsule by mouth daily.     Yes Historical Provider, MD  nitroGLYCERIN (NITROSTAT) 0.4 MG SL tablet Place 1 tablet (0.4 mg total) under the tongue every 5 (five) minutes as needed. Patient taking differently: Place 0.4  mg under the tongue every 5 (five) minutes as needed for chest pain.  08/07/11  Yes Josue Hector, MD  oxybutynin (DITROPAN) 5 MG tablet Take 2.5 mg by mouth 3 (three) times daily.  12/23/13  Yes Historical Provider, MD  torsemide (DEMADEX) 20 MG tablet Take 1 tablet (20 mg total) by mouth 2 (two) times daily. 02/11/14  Yes Josue Hector, MD   BP 118/64 mmHg  Pulse 79  Temp(Src) 98.4 F (36.9 C) (Oral)  Resp 20  Ht 5\' 9"  (1.753 m)  Wt 238 lb (107.956 kg)  BMI 35.13 kg/m2  SpO2 97% Physical Exam  Constitutional: He is oriented to person, place, and time. He appears well-developed and well-nourished.  HENT:  Head: Normocephalic and atraumatic.  Eyes: Conjunctivae and EOM are normal. Pupils are equal, round, and reactive to light.  Neck: Normal range of motion. Neck supple.  Cardiovascular: Normal rate and regular rhythm.   Pulmonary/Chest: Effort normal and breath sounds normal.  Abdominal: Soft. Bowel sounds are normal.  Musculoskeletal:  Minimally tender to tip of right elbow Has ROM of elbow, perhaps less vigorously than normal  Neurological: He is alert and oriented to person, place, and time.  Skin: Skin is warm and dry.  Psychiatric: He has a normal mood and affect. His behavior is normal.  Nursing note and vitals reviewed.   ED Course  Procedures (including critical care time)   DIAGNOSTIC STUDIES: Oxygen Saturation is 97% on room air, normal by my interpretation.    COORDINATION OF CARE: 1:31 PM Discussed treatment plan with patient at beside, including x-ray of right elbow. The patient agrees with the plan and has no further questions at this time.   Labs Review Labs Reviewed - No data to display  Imaging Review Dg Elbow Complete Right  03/18/2014   CLINICAL DATA:  Pain following fall 1 day prior  EXAM: RIGHT ELBOW - COMPLETE 3+ VIEW  COMPARISON:  None.  FINDINGS: Frontal, lateral, and bilateral oblique views were obtained. There is no fracture or dislocation.  Joint spaces appear intact. There is calcification lateral to the distal humeral condyle laterally, felt to have arthropathic etiology. There is soft tissue hematoma medial to the distal medial humeral condyle. There is no appreciable joint space narrowing.  IMPRESSION: Evidence of soft tissue hematoma medially. No fracture or dislocation. No joint effusion. Mild arthropathic change.   Electronically Signed   By: Lowella Grip M.D.   On: 03/18/2014 14:10     EKG Interpretation None      MDM   Final diagnoses:  Fall  Elbow contusion, right, initial encounter  Plain films of right elbow negative. No other injuries. Normal neuro exam. I personally performed the services described in this documentation, which was scribed in my presence. The recorded information has been reviewed and is accurate.   Nat Christen, MD 03/18/14 1440

## 2014-03-18 NOTE — Discharge Instructions (Signed)
X-ray of right elbow was normal. Ice. Ibuprofen or Tylenol for pain.

## 2014-03-18 NOTE — ED Notes (Signed)
Pt fell last night on bilateral knees and hit elbow, denies hitting head, pt unsteady while checking in, pt uses walker as well

## 2014-03-19 ENCOUNTER — Ambulatory Visit: Payer: Medicare Other | Admitting: Physical Therapy

## 2014-03-22 ENCOUNTER — Ambulatory Visit: Payer: Medicare Other | Admitting: Physical Therapy

## 2014-03-24 ENCOUNTER — Encounter: Payer: Self-pay | Admitting: Physical Therapy

## 2014-03-24 ENCOUNTER — Ambulatory Visit: Payer: Medicare Other | Admitting: Physical Therapy

## 2014-03-24 DIAGNOSIS — R293 Abnormal posture: Secondary | ICD-10-CM

## 2014-03-24 DIAGNOSIS — R269 Unspecified abnormalities of gait and mobility: Secondary | ICD-10-CM | POA: Diagnosis not present

## 2014-03-24 NOTE — Therapy (Signed)
[facility] [address] [place], [place], [address] Phone: [phone]   Fax:  [phone]  Physical Therapy Treatment  Patient Details  Name: Jeremy Terry MRN: [id number] Date of Birth: [birth year] Referring Provider:  Susy Frizzle, MD  Encounter Date: 03/24/2014      PT End of Session - 03/24/14 1015    Visit Number 13   Number of Visits 17   Date for PT Re-Evaluation 03/31/14   PT Start Time 0093   PT Stop Time 1100   PT Time Calculation (min) 45 min   Equipment Utilized During Treatment Gait belt   Activity Tolerance Patient tolerated treatment well   Behavior During Therapy Flat affect      Past Medical History  Diagnosis Date  . Coronary artery disease   . Hypertension   . Diabetes mellitus   . Hypercholesteremia   . BPH (benign prostatic hypertrophy)   . DJD (degenerative joint disease)   . Asthma   . Memory loss   . Ventral hernia   . GERD (gastroesophageal reflux disease)   . Allergic rhinitis   . Parkinsonian syndrome   . Hypogonadism male   . Neuropathy of leg     Bilateral  . Depression   . Parkinson disease   . NAION (non-arteritic anterior ischemic optic neuropathy), right eye     Followed by Dr. Katy Fitch    Past Surgical History  Procedure Laterality Date  . Coronary stent placement  2003  . Shoulder surgery  2011    rotator cuff    There were no vitals taken for this visit.  Visit Diagnosis:  Abnormality of gait  Abnormal posture      Subjective Assessment - 03/24/14 0939    Symptoms Near fall trying to maneuver front door without rollator. Wife & he report that he doesn't use rollator inside home unless he is dizzy. He uses rails to get out to car or "[place]" /cart to go with wife to feed animals.   Currently in Pain? No/denies                    [facility] Adult PT Treatment/Exercise - 03/24/14 0930    Transfers   Transfers Sit to Stand;Stand to Sit  5 reps  38.72 sec   Sit to Stand 5: Supervision;Without upper extremity assist;From chair/3-in-1;Five times sit to stand  pushes back of legs against chair to assist   Stand to Sit 5: Supervision;Without upper extremity assist  pushes back of legs against chair to assist   Ambulation/Gait   Ambulation/Gait Yes   Ambulation/Gait Assistance 5: Supervision   Assistive device Rollator   Gait Pattern Right hip hike;Trunk flexed;Wide base of support   Gait velocity 2.64f/sec with rollator, 2.879fsec   Ambulation/Gait Yes   Ambulation/Gait Assistance 4: Min guard   Assistive device Rollator   Gait Pattern Decreased step length - right;Decreased stance time - right;Decreased weight shift to right;Decreased dorsiflexion - right;Trunk flexed;Wide base of support;Poor foot clearance - right;Poor foot clearance - left   Berg Balance Test   Sit to Stand Able to stand without using hands and stabilize independently   Standing Unsupported Able to stand safely 2 minutes   Sitting with Back Unsupported but Feet Supported on Floor or Stool Able to sit safely and securely 2 minutes   Stand to Sit Sits safely with minimal use of hands   Transfers Able to transfer safely, minor use of hands   Standing  Unsupported with Eyes Closed Able to stand 10 seconds safely   Standing Ubsupported with Feet Together Able to place feet together independently and stand 1 minute safely   From Standing, Reach Forward with Outstretched Arm Can reach confidently >25 cm (10")   From Standing Position, Pick up Object from Floor Able to pick up shoe, needs supervision   From Standing Position, Turn to Look Behind Over each Shoulder Looks behind from both sides and weight shifts well   Turn 360 Degrees Able to turn 360 degrees safely but slowly   Standing Unsupported, Alternately Place Feet on Step/Stool Able to complete 4 steps without aid or supervision   Standing Unsupported, One Foot in [place] to plae foot ahead of the other  independently and hold 30 seconds   Standing on One Leg Tries to lift leg/unable to hold 3 seconds but remains standing independently   Total Score 47   Timed Up and Go Test   Normal TUG (seconds) 19.78  19.78sec with rollator, 14.50 sec without device      Patient & wife verbalized fall prevention strategies in home being followed but needed PT to review first.            PT Short Term Goals - 02/24/14 0944    PT SHORT TERM GOAL #1   Title Pt will perform HEP with family/caregiver supervision, to improve transfers, balance. (Target:  02/28/14)   Status Partially Met  caregiver and pt require some cueing for correct performance. Pt is doing HEP at home   PT SHORT TERM GOAL #2   Title perform at least 8 of 10 reps of sit<>stand transfers with minimal UE support and no posterior lean, for improved safety and efficiency with transfers.   Status Partially Met  pt performs 1 rep without UE support, then 10 reps with BUE support without retropulsion or posterior lean.    PT SHORT TERM GOAL #3   Title improve Berg Balance score to at least 38/56 for decreased fall risk.   Status Achieved  Scored 43/56 on 02/24/14   PT SHORT TERM GOAL #4   Title improve Timed Up and Go score to less than or equal to 20 seconds for decreased fall risk.   Status Not Met  35.72 seconds on 02/24/14           PT Long Term Goals - 03/24/14 1015    PT LONG TERM GOAL #1   Title verbalize/demonstrate techniques for fall prevention within home environment.   Baseline 03/24/14 MET Wife & patient report using techniques discussed but needed to PT to review initially.   Status Achieved   PT LONG TERM GOAL #2   Title perform at least 4 of 5 reps of sit<>stand transfers with no UE support, no posterior lean, for improved safety and efficiency with transfers.   Baseline 03/24/14 patient performs 5 of 5 sit<>stand transfers with no UE support but pushes back of legs against chair to stabalize in 38.72seconds.    Status On-going   PT LONG TERM GOAL #3   Title improve Berg Balance score to at least 43/56 for decreased fall risk.   Baseline 03/24/14 MET Berg 47/56   Status Achieved   PT LONG TERM GOAL #4   Title improve TUG score to less than or equal to 15 seconds for decreased fall risk.   Baseline 03/24/14 TUG with rollator 19.78 seconds and without device 14.50seconds.   Status On-going   PT LONG TERM GOAL #5  Title improve gait velocity to at least 2.62 ft/sec for improved gait efficiency and safety.   Baseline 03/24/14 Gait velocity with rollator 2.13f/sec and without device 2.81 ft/sec   Status On-going               Plan - 03/24/14 1015    Clinical Impression Statement Patient met or partially met LTGs. He is scheduled to see primary PT next week to further discuss discharge vs continuing with higher level LTGs.    Pt will benefit from skilled therapeutic intervention in order to improve on the following deficits Abnormal gait;Decreased activity tolerance;Decreased balance;Decreased cognition;Decreased mobility;Decreased knowledge of use of DME;Decreased range of motion;Decreased safety awareness;Decreased strength;Difficulty walking;Other (comment);Postural dysfunction   Rehab Potential Good   Clinical Impairments Affecting Rehab Potential Cognition   PT Frequency 2x / week   PT Duration 8 weeks  wk 6 of 8   PT Treatment/Interventions ADLs/Self Care Home Management;Functional mobility training;Gait training;DME Instruction;Therapeutic activities;Therapeutic exercise;Balance training;Neuromuscular re-education;Patient/family education   PT Next Visit Plan Discuss discharge vs renewal with primary PT, review exercises   Consulted and Agree with Plan of Care Patient;Family member/caregiver        Problem List Patient Active Problem List   Diagnosis Date Noted  . Orthostatic hypotension 03/08/2014  . Edema 02/11/2014  . NAION (non-arteritic anterior ischemic optic neuropathy),  right eye   . Diabetic neuropathy 08/08/2012  . Parkinson's disease 11/13/2011  . ESSENTIAL HYPERTENSION, BENIGN 05/08/2010  . HYPERCHOLESTEROLEMIA 04/03/2010  . Essential hypertension 04/03/2010  . Coronary atherosclerosis 04/03/2010  . ASTHMA, CHILDHOOD 04/03/2010  . GERD 04/03/2010  . VENTRAL HERNIA 04/03/2010  . HIATAL HERNIA 04/03/2010  . BPH (benign prostatic hypertrophy) 04/03/2010  . DEGENERATIVE JOINT DISEASE 04/03/2010  . MEMORY LOSS 04/03/2010  . Diabetes mellitus with neurological manifestation 04/03/2010    WJamey ReasPT, DPT 03/24/2014, 11:46 AM  [place] [address]Phone: [phone]  Fax:  [phone]

## 2014-03-30 ENCOUNTER — Encounter: Payer: Self-pay | Admitting: Neurology

## 2014-03-30 ENCOUNTER — Ambulatory Visit (INDEPENDENT_AMBULATORY_CARE_PROVIDER_SITE_OTHER): Payer: Medicare Other | Admitting: Neurology

## 2014-03-30 VITALS — BP 140/80 | HR 88 | Ht 69.0 in | Wt 240.0 lb

## 2014-03-30 DIAGNOSIS — F330 Major depressive disorder, recurrent, mild: Secondary | ICD-10-CM

## 2014-03-30 DIAGNOSIS — G4733 Obstructive sleep apnea (adult) (pediatric): Secondary | ICD-10-CM

## 2014-03-30 DIAGNOSIS — G231 Progressive supranuclear ophthalmoplegia [Steele-Richardson-Olszewski]: Secondary | ICD-10-CM

## 2014-03-30 MED ORDER — BUPROPION HCL ER (XL) 300 MG PO TB24: 300.0000 mg | ORAL_TABLET | Freq: Every day | ORAL | Status: DC

## 2014-03-30 MED ORDER — DONEPEZIL HCL 10 MG PO TABS: 10.0000 mg | ORAL_TABLET | Freq: Every day | ORAL | Status: AC

## 2014-03-30 MED ORDER — CARBIDOPA-LEVODOPA ER 23.75-95 MG PO CPCR: 23.7500 mg | ORAL_CAPSULE | Freq: Three times a day (TID) | ORAL | Status: DC

## 2014-03-30 NOTE — Progress Notes (Signed)
Jeremy Terry was seen today in the movement disorders clinic for neurologic consultation at the request of [name] [name], MD.  He was previously seen by Dr. [name].  They're requesting a second opinion regarding possible DBS surgery.  He has an appointment with [facility] in this regard as well.  The pt began with L hand tremor that began in 07/2009.  He was started on requip in 06/2010, but after 2 years on the medication, it was tapered off just 2 days ago because of compulsive spending.  When he was initally placed on the medication he had some nausea but his wife did think that it was effective. He has been on levodopa since 09/2011.  This was definitely effective.  He takes 1 1/2 tablets three to four times per day.  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  His wife notices that the medication may wear off about 30 minutes to one hour before the next dosage, as she will notice a resurgence of tremor.  He takes medication at 5 AM, 12 PM, often misses the 3 PM dose and then was taken another at 6 PM.  10/03/12:   The patient presents today with his wife who supplements the history.  They report that he did not get started on Comtan.  His pharmacy told them that it was backordered.  However, when we called the pharmacy and they stated that they had not received the prescription.  The patient did have a fall on July 16 resulting in a wrist fracture.  He was outside and fell over a bucket when he tripped. 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  He is currently taking his carbidopa/levodopa 25/100, 1-1/2 tablets at 8 AM/12 PM/3 M/6 PM.  He cannot tell when the medication wears off.  His wife states that it definitely does help.  The patient did go to therapy.  It was recommended that he use a cane.  He has not been doing that.  His wife recalls that he has been very impulsive.  He  is having major personality change.  He curses a lot and he never did this previously.  He has no hallucinations.  He is having intermittent diplopia.  10/31/12 update:  The pt presents with his wife who supplements the hx.  His Aricept was increased last time to 10 mg daily.  His Requip was d/c.  Is not sure of the levodopa is helping but he continues to take carbidopa/levodopa 25/100, 1-1/2 tablets 4 times per day.  I did discontinue his Cymbalta last visit as he was using it for peripheral neuropathy, but had no symptoms of paresthesias.  He noticed no increase in paresthesias with the discontinuation of Cymbalta.  His mood remains good.  His wife states that his personality has been much better and he is not cursing like he was.  He continues to have some intermittent double vision.  He is exercising some, although not necessarily faithfully.  11/27/12 update:  This patient is accompanied in the office by his spouse who supplements the history.  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  He is currently on Aricept 10 mg daily.  He is on carbidopa/levodopa 25/100, 1-1/2 tablets 4 times per day.  Last visit, I cautiously added Artane, because of his biggest complaint of tremor.  He reports that tremor is markedly better.  He did hold his carbidopa/levodopa at my request prior to coming into the office today.  He really feels no different off of the medication.  His biggest complaint is significant knee pain.  It is preventing him from exercising.  I did review his MRI of the brain that was recently done.  There was ventricular prominence, but I thought that the frontotemporal atrophy was greater than the atrophy posteriorly.  12/29/12 update:  This patient is accompanied in the office by his spouse who supplements the history.  He is on artane and doing well.  Last visit, he did not think that the carbidopa/levodopa 25/100 was helpful and so we stopped it.  He got much  worse after this was stopped (falls, trouble getting out of the bed) and so he went to the PCP on Friday and it was restarted.    He is feeling much better.  His mood is better.  There are no hallucinations.  He does c/o hypophonic speech that is worsening.  02/23/13:  This patient is accompanied in the office by his spouse who supplements the history.  Last visit his carbidopa/levodopa was restarted as he got worse when he was off of the medication.  He takes it at 52 AM/12 PM/3 PM/6 PM.  He is doing well with this and much better now that he is back on the medication.  He is also on aricept 10 mg daily.  He still has spells where he has a significant personality change and is mean to his wife in public, but just as quickly it seems to go away.  They just returned from a cruise and he had a good time.    His only c/o is tremor in the L hand.  He is supposed to be on artane but they don't know what happened to that.  I called the pharmacy and it was last picked up in [place].  He was doing better when on it without significant cognitive change. He is in [place] and doing well with that.  05/25/13 update:  He is accompanied by his wife who supplements the history.  Pt is on levodopa at 8am/12pm/3pm/6pm.  I restarted his artane as he really wanted to retry.  I cautioned them about memory but his wife noted deterioration of memory when he went back on it and she stopped it in Jan.  He has been more moody and depressed.  He has had more balance problems.  He fell on Saturday when he bent over to pick up the dog food dish.  He was supposed to go back to OT but he got a bad sinus infection and then he just got worse and was unmotivated to go back.  If he gets up in the middle of the night, he will sometimes be confused as to how to get the bathroom.  No hallucinations.    07/21/13 update:  Pt presents with wife, who supplements the history.  Is having trouble sleeping at night but is sleeping during the day.  Snores per wife.   Falls asleep watching movie or just when sitting down will doze off.  Still having compulsive eating/buying.  Bought a truck behind wife's back and becoming marial issue.   Having trouble losing weight.  Becoming aggressive with wife at times.  Added lexapro last visit but not helping.   08/11/13 update:  The patient presents today accompanied by his wife who supplements the history.  The patient  has been sleeping better.  The patient reports that he feels somewhat more confused, but his wife disagrees.  The patient did decrease the levodopa and while that compulsive behaviors are better, his balance is much worse.  Overall, the patient feels that he is worse on the lower dose of levodopa, even though his wife is happy that the compulsive behaviors are better.  10/13/13 update:  The patient presents today accompanied by his wife who supplements the history.  Pt is on rytary now, 95 mg - 4 in the AM, 4 in the afternoon and 3 in the evening.  Compulsive behaviors seem better.   Wife states that pt is moody and "mean and angry."  Has had more loss of balance; tells me was just put on a new bladder medication about a month ago and it has really helped.  Gait instability started a month ago.    12/29/13 update:  This patient is accompanied in the office by his spouse who supplements the history.  Pt on rytary 95mg  - 4 in the AM, 4 in the afternoon and 3 in the evening.   They don't think that it has been as effective as in the past and it has been very costly.  Pt c/o more difficulty with coordination but thinks that balance is a little better.  States that steps are smaller and drags R leg.  Dragging leg more for 2 months.  Memory has been "on and off."  Wellbutrin helped greatly with mood - wife states that is 75% better.  Asks about DBS surgery.  Last Thursday, pt was watching TV and noted acute onset of vision change (blurry per pt) on the right side.  He went to bed and it was the same way the next day.  He had  an appt the next day with a dr as he was in a research trial for cholesterol and he told him he thought had a stroke.  He f/u with Dr. Dennard Schaumann.  He saw Dr. Katy Fitch as well and was told that he had a little stroke behind his retina.  He has his carotid u/s tomorrow.  He has an MRI scheduled for thurs night.  He still cannot see anything in the upper quadrant out of the right eye but can see in the lower quandrant but can see out of the left eye.  He was on asa and plavix at the time of the event as well as lipitor.  He was previously on plavix for his cardiac stent.  03/30/14 update:  The patient returns today for follow-up, accompanied by his wife who supplements the history.  Records that were made available to me since last visit were reviewed.  The patient is currently on Rytary 95 mg, 4 in the morning, 3 in afternoon and 4 in the evening.  He had neuropsych testing done by Dr. Leonides Schanz since last visit.  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  Pt got very depressed after this.  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  This demonstrated 1-39% stenosis bilaterally.  He had an echocardiogram on 12/30/2013 that demonstrated a normal left ventricular ejection fraction of 60-65%.  I did change his aspirin and Plavix that he was previously on to Aggrenox.  He has tolerated this well and has had no more episodes.  He had an MRI of the brain since  last visit that was negative for acute infarct.  He did go to the emergency room on 01/03/2015 after he fell and hit his head.  He had a CT of the brain then and it was negative.  Just a few days later on 01/07/2015 he followed up with his cardiologist and was dizzy and was found to be orthostatic. His Lasix was d/c.  Dizziness was much better.   He had labs that were essentially unremarkable.  He went to the emergency room again on 01/17/2015 after a fall  the prior evening after reaching to open the bedroom door.  He hit his elbow this time and x-rays of the elbow were negative.  He has not been faithfully using the walker in the home.  They are planning on going on some upcoming cruise.   PREVIOUS MEDICATIONS: Requip (compulsive spending), Cymbalta  ALLERGIES:   Allergies  Allergen Reactions  . Morphine Hives    hyperventilate  . Hydrocodone Rash    hyperventilate  . Sulfa Antibiotics Rash    hyperventilate    CURRENT MEDICATIONS:  Current Outpatient Prescriptions on File Prior to Visit  Medication Sig Dispense Refill  . albuterol (PROVENTIL HFA;VENTOLIN HFA) 108 (90 BASE) MCG/ACT inhaler Inhale 2 puffs into the lungs every 6 (six) hours as needed for wheezing or shortness of breath. 1 Inhaler 0  . atorvastatin (LIPITOR) 40 MG tablet TAKE ONE TABLET BY MOUTH DAILY (Patient taking differently: Take 40 mg by mouth daily at 6 PM. ) 15 tablet 0  . buPROPion (WELLBUTRIN XL) 150 MG 24 hr tablet Take 1 tablet (150 mg total) by mouth daily. 30 tablet 11  . Carbidopa-Levodopa ER (RYTARY) 23.75-95 MG CPCR Take 4 tablets by mouth 3 (three) times daily. Take 4 tablets in am, 4 tablets in the afternoon, 3 tablets in the evening 330 capsule 5  . dipyridamole-aspirin (AGGRENOX) 200-25 MG per 12 hr capsule Take 1 capsule by mouth 2 (two) times daily. 60 capsule 5  . glipiZIDE (GLUCOTROL) 5 MG tablet TAKE 1 TABLET (5 MG TOTAL) BY MOUTH 2 (TWO) TIMES DAILY BEFORE A MEAL. 60 tablet 3  . losartan (COZAAR) 100 MG tablet TAKE 1 TABLET BY MOUTH EVERY DAY 30 tablet 11  . meloxicam (MOBIC) 15 MG tablet TAKE 1 TABLET BY MOUTH EVERY DAY 30 tablet 3  . metoprolol succinate (TOPROL-XL) 50 MG 24 hr tablet Take 1 tablet (50 mg total) by mouth daily. 30 tablet 6  . mirtazapine (REMERON) 15 MG tablet TAKE 1 TABLET BY MOUTH AT BEDTIME 30 tablet 5  . Multiple Vitamin (MULTIVITAMIN) capsule Take 1 capsule by mouth daily.      Marland Kitchen oxybutynin (DITROPAN) 5 MG tablet Take 2.5  mg by mouth 3 (three) times daily.     Marland Kitchen torsemide (DEMADEX) 20 MG tablet Take 1 tablet (20 mg total) by mouth 2 (two) times daily. 60 tablet 5  . nitroGLYCERIN (NITROSTAT) 0.4 MG SL tablet Place 1 tablet (0.4 mg total) under the tongue every 5 (five) minutes as needed. (Patient not taking: Reported on 03/30/2014) 25 tablet 11  . [DISCONTINUED] rOPINIRole (REQUIP) 1 MG tablet Take 1 mg by mouth 3 (three) times daily.      No current facility-administered medications on file prior to visit.    PAST MEDICAL HISTORY:   Past Medical History  Diagnosis Date  . Coronary artery disease   . Hypertension   . Diabetes mellitus   . Hypercholesteremia   . BPH (benign prostatic hypertrophy)   .  DJD (degenerative joint disease)   . Asthma   . Memory loss   . Ventral hernia   . GERD (gastroesophageal reflux disease)   . Allergic rhinitis   . Parkinsonian syndrome   . Hypogonadism male   . Neuropathy of leg     Bilateral  . Depression   . Parkinson disease   . NAION (non-arteritic anterior ischemic optic neuropathy), right eye     Followed by Dr. Katy Fitch    PAST SURGICAL HISTORY:   Past Surgical History  Procedure Laterality Date  . Coronary stent placement  2003  . Shoulder surgery  2011    rotator cuff    SOCIAL HISTORY:   History   Social History  . Marital Status: Married    Spouse Name: N/A    Number of Children: 2  . Years of Education: 14   Occupational History  . retired     Psychologist, sport and exercise   Social History Main Topics  . Smoking status: Former Smoker    Quit date: 02/26/1998  . Smokeless tobacco: Never Used  . Alcohol Use: Yes     Comment: Occ  . Drug Use: No  . Sexual Activity:    Partners: Female   Other Topics Concern  . Not on file   Social History Narrative   Pt lives at home with his spouse.   Caffeine Use: Does not consume    FAMILY HISTORY:   Family Status  Relation Status Death Age  . Father Deceased 38    CHF  . Mother Deceased     pneumonia  .  Child Alive     2, healthy    ROS:  A complete 10 system review of systems was obtained and was unremarkable apart from what is mentioned above.  PHYSICAL EXAMINATION:    VITALS:   Filed Vitals:   03/30/14 1108  BP: 140/80  Pulse: 88  Height: 5\' 9"  (1.753 m)  Weight: 240 lb (108.863 kg)   Wt Readings from Last 3 Encounters:  03/30/14 240 lb (108.863 kg)  03/18/14 238 lb (107.956 kg)  03/08/14 238 lb (107.956 kg)    GEN:  The patient appears stated age and is in NAD. HEENT:  Normocephalic, atraumatic.  The mucous membranes are moist. The superficial temporal arteries are without ropiness or tenderness. CV:  RRR Lungs:  CTAB Neck/HEME:  There are no carotid bruits bilaterally.  Neurological examination:  Orientation: A MoCA was performed 08/11/13 and the patient scored 18/30.  A MoCA was performed in 01/2013 and the pt scored 17/30.   Relies on wife for hx.   Cranial nerves: There is good facial symmetry.  There is mild left ptosis.  There is facial hypomimia.  Pupils are equal round and reactive to light bilaterally. Fundoscopic exam reveals clear margins bilaterally.Trouble with both upgaze and downgaze.  There are definite square wave jerks.  The visual fields are full to confrontational testing. The speech is fluent and clear.  At times, it becomes quite hypophonic and he has significant difficulty with the guttural sounds.  Soft palate rises symmetrically and there is no tongue deviation. Hearing is intact to conversational tone. Motor: Strength is 5/5 in the bilateral upper and lower extremities.   Shoulder shrug is equal and symmetric.  There is no pronator drift. Deep tendon reflexes: Deep tendon reflexes are 3/4 at the bilateral biceps, triceps, brachioradialis, patella and achilles. Plantar response is upgoing on the left and downgoing on the right.  Movement examination: Tone: There  is normal tone in the bilateral upper extremities.  The tone in the lower extremities is  normal.  Abnormal movements: There is a mild to moderate left upper extremity resting tremor. There is no tremor in the legs Coordination:  There is minimal decremation with hand opening and closing on the left, but finger taps were good bilaterally.  He has difficulty with heel taps and toe taps bilaterally. Gait and Station: The patient has no difficulty arising out of a deep-seated chair without the use of the hands. The patient's stride length is good today but he is ambulating with his walker.   ASSESSMENT/PLAN:  1.  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  This was refilled today.  -We will continue Rytary 95 mg, 4 tablets in the morning, 3 tablets in the afternoon, 4 tablets in the evening.  Samples were provided.  -He will continue off the artane as that made him worse  -Talked to the patient about the fact that he needs to use his walker at all times.  I also talked to him about the fact that at some point I may tell him that he needs to stay seated instead of walking, although I do not think we are at that point. 2.    Peripheral neuropathy, likely secondary to diabetes.  -He is off of Cymbalta and doing well. 3.  Depression  -doing better with anger with wellbutrin.  He does think he has been more depressed lately and would like to increase the Wellbutrin XL from 150 mg daily to 300 mg daily.  He is not suicidal or homicidal. 4.  EDS with [facility]  -On CPAP 5.  Anterior ischemic optic neuropathy  -His recent carotid and echo were unremarkable. 6.  Return in about 4 months (around 07/29/2014).

## 2014-03-31 ENCOUNTER — Ambulatory Visit: Payer: Medicare Other | Attending: Neurology | Admitting: Physical Therapy

## 2014-03-31 DIAGNOSIS — R269 Unspecified abnormalities of gait and mobility: Secondary | ICD-10-CM | POA: Diagnosis not present

## 2014-03-31 DIAGNOSIS — R293 Abnormal posture: Secondary | ICD-10-CM | POA: Insufficient documentation

## 2014-03-31 NOTE — Therapy (Signed)
[facility] [address] [place], [place], [address] Phone: [phone]   Fax:  [phone]  Physical Therapy Treatment  Patient Details  Name: Jeremy Terry MRN: [id number] Date of Birth: October 18, 1945 Referring Provider:  Susy Frizzle, MD  Encounter Date: 03/31/2014      PT End of Session - 03/31/14 1103    Visit Number 14   Number of Visits 22  Recert 2x/wk 4 weeks today   Date for PT Re-Evaluation 04/30/14   Authorization Type G-code every 10th visit   PT Start Time 0852   PT Stop Time 0932   PT Time Calculation (min) 40 min   Equipment Utilized During Treatment Gait belt   Activity Tolerance Patient tolerated treatment well   Behavior During Therapy Flat affect      Past Medical History  Diagnosis Date  . Coronary artery disease   . Hypertension   . Diabetes mellitus   . Hypercholesteremia   . BPH (benign prostatic hypertrophy)   . DJD (degenerative joint disease)   . Asthma   . Memory loss   . Ventral hernia   . GERD (gastroesophageal reflux disease)   . Allergic rhinitis   . Parkinsonian syndrome   . Hypogonadism male   . Neuropathy of leg     Bilateral  . Depression   . Parkinson disease   . NAION (non-arteritic anterior ischemic optic neuropathy), right eye     Followed by Dr. Katy Fitch    Past Surgical History  Procedure Laterality Date  . Coronary stent placement  2003  . Shoulder surgery  2011    rotator cuff    There were no vitals taken for this visit.  Visit Diagnosis:  Abnormality of gait - Plan: PT plan of care cert/re-cert  Abnormal posture - Plan: PT plan of care cert/re-cert      Subjective Assessment - 03/31/14 0857    Symptoms Went for check up with Dr. Carles Collet; no falls in the past week.  Wife reports he has come close to falling, but no falls   Currently in Pain? No/denies         Self Care:  Discussed current exercise routine-Pt and wife belong to [facility], but don't  go often due to being so crowded.  Wife reports pt using exercise bike at home at least every other day, 5-10 minutes at a time.  Discussed use of aerobic activity for improved strength, flexibility, high intensity for new learning capacity.  Discussed particularly higher intensity and longer bouts of exercise-at least 10-20 minutes.  Discussed progress to goals/plan of care; will plan to see pt 1-2 more weeks to update exercises and progress to community fitness.  There Act:  Sit<>stand x 10 reps from 20 inch surface, then sit<>stand from 18 inch chair surface, x 10 reps, with close supervision and initial/occasional cues for technique.  Pt responds well to cues for exaggerrated movements to squat to sit.  Neuro Re-education:  At counter, back step and weight shift x 15 reps with UE support. Sidestepping at counter with frequent cues for foot clearance with sidestepping.  Wide base of support weightshifting laterally with UE support>reaching with UEs>shift and lift lower extremities with 1-2 second hold for improved single limb stance.                    PT Education - 03/31/14 1102    Education provided Yes   Education Details progress towards goals; discussion regaring plan  of care; plan to continue for 2 additional weeks for updating/progressing HEP   Person(s) Educated Spouse   Methods Explanation   Comprehension Verbalized understanding          PT Short Term Goals - 02/24/14 0944    PT SHORT TERM GOAL #1   Title Pt will perform HEP with family/caregiver supervision, to improve transfers, balance. (Target:  02/28/14)   Status Partially Met  caregiver and pt require some cueing for correct performance. Pt is doing HEP at home   PT SHORT TERM GOAL #2   Title perform at least 8 of 10 reps of sit<>stand transfers with minimal UE support and no posterior lean, for improved safety and efficiency with transfers.   Status Partially Met  pt performs 1 rep without UE support, then  10 reps with BUE support without retropulsion or posterior lean.    PT SHORT TERM GOAL #3   Title improve Berg Balance score to at least 38/56 for decreased fall risk.   Status Achieved  Scored 43/56 on 02/24/14   PT SHORT TERM GOAL #4   Title improve Timed Up and Go score to less than or equal to 20 seconds for decreased fall risk.   Status Not Met  35.72 seconds on 02/24/14           PT Long Term Goals - 03/31/14 1337    PT LONG TERM GOAL #1   Title New goal 03/31/14:  perform at least 4 of 5 reps of sit<>stand transfers with no UE support, no posterior lean, for improved safety and efficiency with transfers.  Target 04/29/14   Time 4   Period Weeks   Status New   PT LONG TERM GOAL #2   Title improve TUG to less than or equal to 15 seconds for decreased fall risk.   Time 4   Period Weeks   Status New   PT LONG TERM GOAL #3   Title improve gait velocity to at least 2.62 ft/sec for improved gait efficiency and safety.   Time 4   Period Weeks   Status New   PT LONG TERM GOAL #4   Title perform progressive HEP with family supervision.   Time 4   Period Weeks   Status New   PT LONG TERM GOAL #5   Title verbalize plans for continued community fitness, with wife's supervision.   Time 4   Period Weeks   Status New               Plan - 03/31/14 1334    Clinical Impression Statement Pt has met LTG #1, 3.  LTG #2, 4, 5 not met.  Pt would benefit from additional short course of therapy to set pt and wife up with progressive HEP and plans for conmmunity fitness.   Pt will benefit from skilled therapeutic intervention in order to improve on the following deficits Abnormal gait;Decreased activity tolerance;Decreased balance;Decreased cognition;Decreased mobility;Decreased knowledge of use of DME;Decreased range of motion;Decreased safety awareness;Decreased strength;Difficulty walking;Other (comment);Postural dysfunction   Rehab Potential Good   Clinical Impairments Affecting  Rehab Potential Cognition   PT Frequency 2x / week   PT Duration 4 weeks  recert today   PT Treatment/Interventions ADLs/Self Care Home Management;Functional mobility training;Gait training;DME Instruction;Therapeutic activities;Therapeutic exercise;Balance training;Neuromuscular re-education;Patient/family education   PT Next Visit Plan review full HEP and progress HEP; work towards transition to community fitness   Consulted and Agree with Plan of Care Patient;Family member/caregiver   Family Member Consulted  wife        Problem List Patient Active Problem List   Diagnosis Date Noted  . Orthostatic hypotension 03/08/2014  . Edema 02/11/2014  . NAION (non-arteritic anterior ischemic optic neuropathy), right eye   . Diabetic neuropathy 08/08/2012  . Parkinson's disease 11/13/2011  . ESSENTIAL HYPERTENSION, BENIGN 05/08/2010  . HYPERCHOLESTEROLEMIA 04/03/2010  . Essential hypertension 04/03/2010  . Coronary atherosclerosis 04/03/2010  . ASTHMA, CHILDHOOD 04/03/2010  . GERD 04/03/2010  . VENTRAL HERNIA 04/03/2010  . HIATAL HERNIA 04/03/2010  . BPH (benign prostatic hypertrophy) 04/03/2010  . DEGENERATIVE JOINT DISEASE 04/03/2010  . MEMORY LOSS 04/03/2010  . Diabetes mellitus with neurological manifestation 04/03/2010    Gaurav Baldree W. 03/31/2014, 1:44 PM   Mady Haagensen, PT 03/31/2014 1:45 PM Phone: [phone] Fax: [facility] [address] [place], [place], [address] Phone: [phone]   Fax:  [phone]

## 2014-04-02 ENCOUNTER — Ambulatory Visit: Payer: Medicare Other | Admitting: Physical Therapy

## 2014-04-02 DIAGNOSIS — R269 Unspecified abnormalities of gait and mobility: Secondary | ICD-10-CM | POA: Diagnosis not present

## 2014-04-02 DIAGNOSIS — R293 Abnormal posture: Secondary | ICD-10-CM

## 2014-04-02 NOTE — Therapy (Signed)
[facility] [address] [place], [place], [address] Phone: [phone]   Fax:  [phone]  Physical Therapy Treatment  Patient Details  Name: Jeremy Terry MRN: [id number] Date of Birth: 05/26/1945 Referring Provider:  [name], Warren T, MD  Encounter Date: 04/02/2014      PT End of Session - 04/02/14 1240    Visit Number 15   Number of Visits 22   Date for PT Re-Evaluation 04/30/14   Authorization Type G-code every 10th visit   PT Start Time 0930   PT Stop Time 1015   PT Time Calculation (min) 45 min   Equipment Utilized During Treatment Gait belt   Activity Tolerance Patient tolerated treatment well      Past Medical History  Diagnosis Date  . Coronary artery disease   . Hypertension   . Diabetes mellitus   . Hypercholesteremia   . BPH (benign prostatic hypertrophy)   . DJD (degenerative joint disease)   . Asthma   . Memory loss   . Ventral hernia   . GERD (gastroesophageal reflux disease)   . Allergic rhinitis   . Parkinsonian syndrome   . Hypogonadism male   . Neuropathy of leg     Bilateral  . Depression   . Parkinson disease   . NAION (non-arteritic anterior ischemic optic neuropathy), right eye     Followed by Dr. Groat    Past Surgical History  Procedure Laterality Date  . Coronary stent placement  2003  . Shoulder surgery  2011    rotator cuff    There were no vitals taken for this visit.  Visit Diagnosis:  Abnormality of gait  Abnormal posture      Subjective Assessment - 04/02/14 0932    Symptoms No falls since last visit   Currently in Pain? No/denies                    [facility] Adult PT Treatment/Exercise - 04/02/14 0937    Lumbar Exercises: Aerobic   Stationary Bike SciFit, Level 2, 4 extremities x 8 minutes, for leg strengthening, with RPM at 50, cues to increase intensity to >60 (pt unable)  requests PT assistance to keep RPM>50     At counter-heel/toe  raises x 20 reps; standing mini-squats at counter 15 reps, facilitating hip strategy with bilateral UE support.    Neuro: Reviewed current HEP, which PT has provided over the course of therapy.  At counter, back step and weight shift x 15 reps each, with cues for large amplitude stepping.  Side step and weight shift x 15 reps each, with tactile cues at hips for improved weight shifting. (Added to HEP)  Marching in place x 15 reps with cues for higher leg lift.  Sidestepping along counter 3 reps x 10 ft, with cues for large step length.  Practiced side step quarter turns to the right and to the left, 2 reps each with min guard and cues for sequencing, to assist with turning practice (Added to HEP)           PT Education - 04/02/14 1239    Education provided Yes   Education Details HEP-side step and weightshift, quarter turns   Person(s) Educated Patient;Spouse   Methods Explanation;Demonstration;Handout   Comprehension Verbal cues required;Verbalized understanding          PT Short Term Goals - 02/24/14 0944    PT SHORT TERM GOAL #1   Title Pt will perform HEP with   family/caregiver supervision, to improve transfers, balance. (Target:  02/28/14)   Status Partially Met  caregiver and pt require some cueing for correct performance. Pt is doing HEP at home   PT SHORT TERM GOAL #2   Title perform at least 8 of 10 reps of sit<>stand transfers with minimal UE support and no posterior lean, for improved safety and efficiency with transfers.   Status Partially Met  pt performs 1 rep without UE support, then 10 reps with BUE support without retropulsion or posterior lean.    PT SHORT TERM GOAL #3   Title improve Berg Balance score to at least 38/56 for decreased fall risk.   Status Achieved  Scored 43/56 on 02/24/14   PT SHORT TERM GOAL #4   Title improve Timed Up and Go score to less than or equal to 20 seconds for decreased fall risk.   Status Not Met  35.72 seconds on 02/24/14            PT Long Term Goals - 03/31/14 1337    PT LONG TERM GOAL #1   Title New goal 03/31/14:  perform at least 4 of 5 reps of sit<>stand transfers with no UE support, no posterior lean, for improved safety and efficiency with transfers.  Target 04/29/14   Time 4   Period Weeks   Status New   PT LONG TERM GOAL #2   Title improve TUG to less than or equal to 15 seconds for decreased fall risk.   Time 4   Period Weeks   Status New   PT LONG TERM GOAL #3   Title improve gait velocity to at least 2.62 ft/sec for improved gait efficiency and safety.   Time 4   Period Weeks   Status New   PT LONG TERM GOAL #4   Title perform progressive HEP with family supervision.   Time 4   Period Weeks   Status New   PT LONG TERM GOAL #5   Title verbalize plans for continued community fitness, with wife's supervision.   Time 4   Period Weeks   Status New               Plan - 04/02/14 1240    Clinical Impression Statement HEP reviewed and added to today.  Wife appears to be assisting patient at home with performance of HEP.     Pt will benefit from skilled therapeutic intervention in order to improve on the following deficits Abnormal gait;Decreased activity tolerance;Decreased balance;Decreased cognition;Decreased mobility;Decreased knowledge of use of DME;Decreased range of motion;Decreased safety awareness;Decreased strength;Difficulty walking;Other (comment);Postural dysfunction   Rehab Potential Good   Clinical Impairments Affecting Rehab Potential Cognition   PT Frequency 2x / week   PT Duration 4 weeks   PT Treatment/Interventions ADLs/Self Care Home Management;Functional mobility training;Gait training;DME Instruction;Therapeutic activities;Therapeutic exercise;Balance training;Neuromuscular re-education;Patient/family education   PT Next Visit Plan discuss transition to community fitness; has appointments next week-work towards d/c end of next week   Consulted and Agree with Plan  of Care Patient;Family member/caregiver   Family Member Consulted wife        Problem List Patient Active Problem List   Diagnosis Date Noted  . Orthostatic hypotension 03/08/2014  . Edema 02/11/2014  . NAION (non-arteritic anterior ischemic optic neuropathy), right eye   . Diabetic neuropathy 08/08/2012  . Parkinson's disease 11/13/2011  . ESSENTIAL HYPERTENSION, BENIGN 05/08/2010  . HYPERCHOLESTEROLEMIA 04/03/2010  . Essential hypertension 04/03/2010  . Coronary atherosclerosis 04/03/2010  . ASTHMA, CHILDHOOD 04/03/2010  .   GERD 04/03/2010  . VENTRAL HERNIA 04/03/2010  . HIATAL HERNIA 04/03/2010  . BPH (benign prostatic hypertrophy) 04/03/2010  . DEGENERATIVE JOINT DISEASE 04/03/2010  . MEMORY LOSS 04/03/2010  . Diabetes mellitus with neurological manifestation 04/03/2010    Keimora Swartout W. 04/02/2014, 12:42 PM   Mady Haagensen, PT 04/02/2014 12:43 PM Phone: [phone] Fax: [facility] [address] [place], [place], [address] Phone: [phone]   Fax:  [phone]

## 2014-04-02 NOTE — Patient Instructions (Addendum)
Single Step: Side   Hold to the counter.  Lifting foot off floor, take one step slowly to left side. Return to starting position. Repeat __10-15__ times per session. Do __1-2__ sessions per day. Repeat to opposite side.  Copyright  VHI. All rights reserved.  Turning in Place: Compliant Surface (Grass / Sand)   Standing at counter, lead with head and turn slowly, making quarter turns toward left. (this is a sidestep quarter turn) Repeat __3__ times per session. Do __1-2__ sessions per day.  (Try to use this strategy for turning in small spaces)  Copyright  VHI. All rights reserved.

## 2014-04-07 ENCOUNTER — Ambulatory Visit: Payer: Medicare Other | Admitting: Physical Therapy

## 2014-04-07 DIAGNOSIS — R293 Abnormal posture: Secondary | ICD-10-CM

## 2014-04-07 DIAGNOSIS — R269 Unspecified abnormalities of gait and mobility: Secondary | ICD-10-CM

## 2014-04-07 NOTE — Patient Instructions (Signed)
Community Fitness Options  *[employer]-recumbent bike for 30 minutes 3/week  *HEP provided from therapy-daily  *Walking Program-30 minutes 5/week with Rollator with assistance.  Focus on posture, foot clearance and safe speed   May need to gradually work up to the above times

## 2014-04-08 NOTE — Therapy (Addendum)
[facility] [address] [place], [place], [address] Phone: [phone]   Fax:  [phone]  Physical Therapy Treatment  Patient Details  Name: Jeremy Terry MRN: [id number] Date of Birth: 03/02/1945 Referring Provider:  Susy Frizzle, MD  Encounter Date: 04/07/2014      PT End of Session - 04/08/14 0842    Visit Number 16   Number of Visits 22   Date for PT Re-Evaluation 04/30/14   Authorization Type G-code every 10th visit   PT Start Time 0848   PT Stop Time 0930   PT Time Calculation (min) 42 min   Equipment Utilized During Treatment Gait belt   Activity Tolerance Patient tolerated treatment well   Behavior During Therapy Flat affect      Past Medical History  Diagnosis Date  . Coronary artery disease   . Hypertension   . Diabetes mellitus   . Hypercholesteremia   . BPH (benign prostatic hypertrophy)   . DJD (degenerative joint disease)   . Asthma   . Memory loss   . Ventral hernia   . GERD (gastroesophageal reflux disease)   . Allergic rhinitis   . Parkinsonian syndrome   . Hypogonadism male   . Neuropathy of leg     Bilateral  . Depression   . Parkinson disease   . NAION (non-arteritic anterior ischemic optic neuropathy), right eye     Followed by Dr. Katy Fitch    Past Surgical History  Procedure Laterality Date  . Coronary stent placement  2003  . Shoulder surgery  2011    rotator cuff    There were no vitals taken for this visit.  Visit Diagnosis:  Abnormality of gait  Abnormal posture      Subjective Assessment - 04/07/14 0853    Symptoms Pt had a fall getting out of truck last Friday-denies injury.  Wife reports having a hard time getting pt up out of floor after he falls.   Currently in Pain? No/denies                    [facility] Adult PT Treatment/Exercise - 04/08/14 0001    Ambulation/Gait   Ambulation/Gait Yes   Ambulation/Gait Assistance --   Ambulation  Distance (Feet) --  twice   Gait velocity --      Scifit level 2 all 4 extremities x 8 minutes for leg strengthening with rpm > 50 and cues to increase to >60 but pt unable. Five times sit to stand-pt unable to consistently maintain forward weight shift to prevent posterior lean upon standing.  Repeated multiple times To reinforce concept.  Pt responds well with cues but even with constant cues, tends to push posterior at times. TUG 24.6 seconds with rollator and 19.0 seconds without device Gait velocity with rollator 1.94 ft/second          PT Education - 04/08/14 0842    Education provided Yes   Education Details HEP, Community Fitness Options   Person(s) Educated Patient;Spouse   Methods Explanation;Demonstration;Handout   Comprehension Verbalized understanding;Returned demonstration          PT Short Term Goals - 02/24/14 0944    PT SHORT TERM GOAL #1   Title Pt will perform HEP with family/caregiver supervision, to improve transfers, balance. (Target:  02/28/14)   Status Partially Met  caregiver and pt require some cueing for correct performance. Pt is doing HEP at home   PT SHORT TERM GOAL #2  Title perform at least 8 of 10 reps of sit<>stand transfers with minimal UE support and no posterior lean, for improved safety and efficiency with transfers.   Status Partially Met  pt performs 1 rep without UE support, then 10 reps with BUE support without retropulsion or posterior lean.    PT SHORT TERM GOAL #3   Title improve Berg Balance score to at least 38/56 for decreased fall risk.   Status Achieved  Scored 43/56 on 02/24/14   PT SHORT TERM GOAL #4   Title improve Timed Up and Go score to less than or equal to 20 seconds for decreased fall risk.   Status Not Met  35.72 seconds on 02/24/14           PT Long Term Goals - 04/08/14 0844    PT LONG TERM GOAL #2   Title improve TUG to less than or equal to 15 seconds for decreased fall risk.   Status Not Met   PT  LONG TERM GOAL #3   Title improve gait velocity to at least 2.62 ft/sec for improved gait efficiency and safety.   Status Not Met   PT LONG TERM GOAL #4   Title perform progressive HEP with family supervision.   Status Achieved   PT LONG TERM GOAL #5   Title verbalize plans for continued community fitness, with wife's supervision.   Status Achieved               Plan - 04/08/14 0843    Clinical Impression Statement Wife states she will rejoin the [employer] in [place] for cardio and walking program.  Continues to do HEP daily with patient.     Pt will benefit from skilled therapeutic intervention in order to improve on the following deficits Abnormal gait;Decreased activity tolerance;Decreased balance;Decreased cognition;Decreased mobility;Decreased knowledge of use of DME;Decreased range of motion;Decreased safety awareness;Decreased strength;Difficulty walking;Other (comment);Postural dysfunction   Rehab Potential Good   Clinical Impairments Affecting Rehab Potential Cognition   PT Frequency 2x / week   PT Duration 4 weeks   PT Treatment/Interventions ADLs/Self Care Home Management;Functional mobility training;Gait training;DME Instruction;Therapeutic activities;Therapeutic exercise;Balance training;Neuromuscular re-education;Patient/family education   PT Next Visit Plan Finish checking goals, G-code.   Consulted and Agree with Plan of Care Patient;Family member/caregiver   Family Member Consulted wife        Problem List Patient Active Problem List   Diagnosis Date Noted  . Orthostatic hypotension 03/08/2014  . Edema 02/11/2014  . NAION (non-arteritic anterior ischemic optic neuropathy), right eye   . Diabetic neuropathy 08/08/2012  . Parkinson's disease 11/13/2011  . ESSENTIAL HYPERTENSION, BENIGN 05/08/2010  . HYPERCHOLESTEROLEMIA 04/03/2010  . Essential hypertension 04/03/2010  . Coronary atherosclerosis 04/03/2010  . ASTHMA, CHILDHOOD 04/03/2010  . GERD 04/03/2010   . VENTRAL HERNIA 04/03/2010  . HIATAL HERNIA 04/03/2010  . BPH (benign prostatic hypertrophy) 04/03/2010  . DEGENERATIVE JOINT DISEASE 04/03/2010  . MEMORY LOSS 04/03/2010  . Diabetes mellitus with neurological manifestation 04/03/2010    Narda Bonds 04/08/2014, 8:46 AM  [facility] [address] [place], [place], [address] Phone: [phone]   Fax:  [place] 04/08/2014 8:46 AM Phone: [phone] Fax: [phone]  Addended: Denise Terry Robertson, [place] 04/08/2014 10:37 AM Phone: [phone] Fax: [phone]

## 2014-04-09 ENCOUNTER — Ambulatory Visit: Payer: Medicare Other | Admitting: Physical Therapy

## 2014-04-09 DIAGNOSIS — R269 Unspecified abnormalities of gait and mobility: Secondary | ICD-10-CM

## 2014-04-09 DIAGNOSIS — R293 Abnormal posture: Secondary | ICD-10-CM

## 2014-04-09 NOTE — Therapy (Signed)
[facility] [address] [facility] [place], [place], [address] Phone: [phone]   Fax:  [phone]  Physical Therapy Treatment  Patient Details  Name: Jeremy Terry MRN: [id number] Date of Birth: Oct 21, 1945 Referring Provider:  Susy Frizzle, MD  Encounter Date: 04/09/2014      PT End of Session - 04/09/14 1233    Visit Number 17   Authorization Type G-code every 10th visit   PT Start Time 0848   PT Stop Time 0930   PT Time Calculation (min) 42 min   Equipment Utilized During Treatment Gait belt   Activity Tolerance Patient tolerated treatment well      Past Medical History  Diagnosis Date  . Coronary artery disease   . Hypertension   . Diabetes mellitus   . Hypercholesteremia   . BPH (benign prostatic hypertrophy)   . DJD (degenerative joint disease)   . Asthma   . Memory loss   . Ventral hernia   . GERD (gastroesophageal reflux disease)   . Allergic rhinitis   . Parkinsonian syndrome   . Hypogonadism male   . Neuropathy of leg     Bilateral  . Depression   . Parkinson disease   . NAION (non-arteritic anterior ischemic optic neuropathy), right eye     Followed by Dr. Katy Fitch    Past Surgical History  Procedure Laterality Date  . Coronary stent placement  2003  . Shoulder surgery  2011    rotator cuff    There were no vitals taken for this visit.  Visit Diagnosis:  Abnormality of gait  Abnormal posture      Subjective Assessment - 04/09/14 0847    Symptoms No additional falls; no pain   Currently in Pain? No/denies         THERAPEUTIC ACTIVITY:  Sit<>stand transfers x 10 reps from 20 inch mat surface, then 10 reps from 18 inch chair surface, with initial verbal cues and minguard assistance>supervision, for improved momentum and forward lean with transfers.  Floor to stand transfer with minimal assistance and verbal cues for increased intensity of movement and appropriate technique.  Pt  able to go from supine>sidelying>prone>quadruped with supervision and go quadruped>half kneel>stand with minimal assistance.  THERAPEUTIC EXERCISE Seated stepping in and out marching, 2 sets of 10 reps to assist with widening base of support prior to standing.  Seated marching 2 sets of 5 reps, LAQs 2 sets x 5 reps, then ankle pumps 2 sets x 10 reps, then resisted knee flexion with red theraband x 10 reps.  SELF CARE: Reviewed community fitness information provided at last session-wife plans to rejoin [facility] and also reports understanding of explanation of intensity of exercise from last visit.  Discussed wife's role in providing cues for transfers, technique for floor>stand.  Discussed compensation of using tape or targets for foot placement at varied areas within the home to assist with widened BOS.  Discussed POC and plans to D/C today; PD screens in 6 months                     PT Education - 04/09/14 1232    Education provided Yes   Education Details Review of community fitness; floor>stand; plans to D/C and return for PT, OT, and speech screens in 6 months   Person(s) Educated Patient;Spouse   Methods Explanation;Demonstration   Comprehension Verbalized understanding;Returned demonstration          PT Short Term Goals - 02/24/14 [phone]  PT SHORT TERM GOAL #1   Title Pt will perform HEP with family/caregiver supervision, to improve transfers, balance. (Target:  02/28/14)   Status Partially Met  caregiver and pt require some cueing for correct performance. Pt is doing HEP at home   PT SHORT TERM GOAL #2   Title perform at least 8 of 10 reps of sit<>stand transfers with minimal UE support and no posterior lean, for improved safety and efficiency with transfers.   Status Partially Met  pt performs 1 rep without UE support, then 10 reps with BUE support without retropulsion or posterior lean.    PT SHORT TERM GOAL #3   Title improve Berg Balance score to at least  38/56 for decreased fall risk.   Status Achieved  Scored 43/56 on 02/24/14   PT SHORT TERM GOAL #4   Title improve Timed Up and Go score to less than or equal to 20 seconds for decreased fall risk.   Status Not Met  35.72 seconds on 02/24/14           PT Long Term Goals - May 07, 2014 0850    PT LONG TERM GOAL #1   Title New goal 03/31/14:  perform at least 4 of 5 reps of sit<>stand transfers with no UE support, no posterior lean, for improved safety and efficiency with transfers.  Target 04/29/14   Status Not Met   PT LONG TERM GOAL #2   Title improve TUG to less than or equal to 15 seconds for decreased fall risk.   Status Not Met   PT LONG TERM GOAL #3   Title improve gait velocity to at least 2.62 ft/sec for improved gait efficiency and safety.   Status Not Met   PT LONG TERM GOAL #4   Title perform progressive HEP with family supervision.   Status Achieved   PT LONG TERM GOAL #5   Title verbalize plans for continued community fitness, with wife's supervision.   Status Achieved               Plan - May 07, 2014 1234    Clinical Impression Statement LTG #1 not met due to pt requires cues and supervision to prevent posterior lean with transfers.  LTG# 2, 3 not met; LTG #4 and 5 met.  Pt and wife in agreement with discharge this visit.   PT Next Visit Plan PT discharge today          G-Codes - 05/07/2014 1236    Functional Assessment Tool Used TUG 24.6 seconds, Gait velocity 1.94 ft/sec   Functional Limitation Mobility: Walking and moving around   Mobility: Walking and Moving Around Goal Status [phone]) At least 60 percent but less than 80 percent impaired, limited or restricted   Mobility: Walking and Moving Around Discharge Status [phone]) At least 60 percent but less than 80 percent impaired, limited or restricted      Problem List Patient Active Problem List   Diagnosis Date Noted  . Orthostatic hypotension 03/08/2014  . Edema 02/11/2014  . NAION (non-arteritic  anterior ischemic optic neuropathy), right eye   . Diabetic neuropathy 08/08/2012  . Parkinson's disease 11/13/2011  . ESSENTIAL HYPERTENSION, BENIGN 05/08/2010  . HYPERCHOLESTEROLEMIA 04/03/2010  . Essential hypertension 04/03/2010  . Coronary atherosclerosis 04/03/2010  . ASTHMA, CHILDHOOD 04/03/2010  . GERD 04/03/2010  . VENTRAL HERNIA 04/03/2010  . HIATAL HERNIA 04/03/2010  . BPH (benign prostatic hypertrophy) 04/03/2010  . DEGENERATIVE JOINT DISEASE 04/03/2010  . MEMORY LOSS 04/03/2010  . Diabetes mellitus with  neurological manifestation 04/03/2010  PHYSICAL THERAPY DISCHARGE SUMMARY  Visits from Start of Care: 17  Current functional level related to goals / functional outcomes:     PT Long Term Goals - 04/09/14 0850    PT LONG TERM GOAL #1   Title New goal 03/31/14:  perform at least 4 of 5 reps of sit<>stand transfers with no UE support, no posterior lean, for improved safety and efficiency with transfers.  Target 04/29/14   Status Not Met   PT LONG TERM GOAL #2   Title improve TUG to less than or equal to 15 seconds for decreased fall risk.   Status Not Met   PT LONG TERM GOAL #3   Title improve gait velocity to at least 2.62 ft/sec for improved gait efficiency and safety.   Status Not Met   PT LONG TERM GOAL #4   Title perform progressive HEP with family supervision.   Status Achieved   PT LONG TERM GOAL #5   Title verbalize plans for continued community fitness, with wife's supervision.   Status Achieved       Remaining deficits: Balance, transfer safety, decreased safety awareness, gait   Education / Equipment: HEP, fall prevention, transfer technique, community fitness  Plan: Patient agrees to discharge.  Patient goals were partially met. Patient is being discharged due to                                                     ?????maximizing rehab potential at this time.  Pt scheduled screens in 6 months for PT, OT, speech.      Haidynn Almendarez W. 04/09/2014,  12:41 PM  Mady Haagensen, PT 04/09/2014 12:44 PM Phone: [phone] Fax: [facility] [address] [place], [place], [address] Phone: [phone]   Fax:  [phone]

## 2014-04-28 ENCOUNTER — Telehealth: Payer: Self-pay | Admitting: *Deleted

## 2014-04-28 NOTE — Telephone Encounter (Signed)
Received a call from [facility] at Dr. Felton Clinton at [facility] clinic stating needs Immunizations record for this pt, stated did not want to duplicate his immunizations. I faxed over the immunizations to [phone] to [facility].

## 2014-05-03 ENCOUNTER — Telehealth: Payer: Self-pay | Admitting: Neurology

## 2014-05-03 NOTE — Telephone Encounter (Signed)
Jeremy Terry, pt's spouse called wanting for pt's last two office notes to be

## 2014-05-14 ENCOUNTER — Telehealth: Payer: Self-pay | Admitting: Neurology

## 2014-05-14 NOTE — Telephone Encounter (Signed)
Medical records request received and forwarded to medical records.

## 2014-06-15 ENCOUNTER — Other Ambulatory Visit: Payer: Self-pay | Admitting: Neurology

## 2014-07-05 ENCOUNTER — Other Ambulatory Visit: Payer: Self-pay | Admitting: Family Medicine

## 2014-07-06 ENCOUNTER — Other Ambulatory Visit: Payer: Self-pay | Admitting: Neurology

## 2014-07-06 ENCOUNTER — Other Ambulatory Visit: Payer: Self-pay | Admitting: Family Medicine

## 2014-07-06 NOTE — Telephone Encounter (Signed)
[facility] RX denied. Never prescribed from our office.

## 2014-07-06 NOTE — Telephone Encounter (Signed)
Refill appropriate and filled per protocol. 

## 2014-07-13 ENCOUNTER — Encounter: Payer: Self-pay | Admitting: Neurology

## 2014-07-13 ENCOUNTER — Ambulatory Visit (INDEPENDENT_AMBULATORY_CARE_PROVIDER_SITE_OTHER): Payer: Medicare Other | Admitting: Neurology

## 2014-07-13 VITALS — BP 152/62 | HR 92 | Ht 69.0 in | Wt 222.0 lb

## 2014-07-13 DIAGNOSIS — T753XXA Motion sickness, initial encounter: Secondary | ICD-10-CM | POA: Diagnosis not present

## 2014-07-13 DIAGNOSIS — G231 Progressive supranuclear ophthalmoplegia [Steele-Richardson-Olszewski]: Secondary | ICD-10-CM

## 2014-07-13 DIAGNOSIS — H47011 Ischemic optic neuropathy, right eye: Secondary | ICD-10-CM

## 2014-07-13 DIAGNOSIS — F330 Major depressive disorder, recurrent, mild: Secondary | ICD-10-CM

## 2014-07-13 MED ORDER — CARBIDOPA-LEVODOPA ER 23.75-95 MG PO CPCR: 3.0000 | ORAL_CAPSULE | Freq: Three times a day (TID) | ORAL | Status: DC

## 2014-07-13 MED ORDER — ONDANSETRON 4 MG PO TBDP: 4.0000 mg | ORAL_TABLET | Freq: Three times a day (TID) | ORAL | Status: AC | PRN

## 2014-07-13 NOTE — Progress Notes (Signed)
Jeremy Terry was seen today in the movement disorders clinic for neurologic consultation at the request of [name] [name], MD.  He was previously seen by Dr. [name].  They're requesting a second opinion regarding possible DBS surgery.  He has an appointment with [facility] in this regard as well.  The pt began with L hand tremor that began in 07/2009.  He was started on requip in 06/2010, but after 2 years on the medication, it was tapered off just 2 days ago because of compulsive spending.  When he was initally placed on the medication he had some nausea but his wife did think that it was effective. He has been on levodopa since 09/2011.  This was definitely effective.  He takes 1 1/2 tablets three to four times per day.  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  His wife notices that the medication may wear off about 30 minutes to one hour before the next dosage, as she will notice a resurgence of tremor.  He takes medication at 5 AM, 12 PM, often misses the 3 PM dose and then was taken another at 6 PM.  10/03/12:   The patient presents today with his wife who supplements the history.  They report that he did not get started on Comtan.  His pharmacy told them that it was backordered.  However, when we called the pharmacy and they stated that they had not received the prescription.  The patient did have a fall on July 16 resulting in a wrist fracture.  He was outside and fell over a bucket when he tripped. 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  He is currently taking his carbidopa/levodopa 25/100, 1-1/2 tablets at 8 AM/12 PM/3 M/6 PM.  He cannot tell when the medication wears off.  His wife states that it definitely does help.  The patient did go to therapy.  It was recommended that he use a cane.  He has not been doing that.  His wife recalls that he has been very impulsive.  He  is having major personality change.  He curses a lot and he never did this previously.  He has no hallucinations.  He is having intermittent diplopia.  10/31/12 update:  The pt presents with his wife who supplements the hx.  His Aricept was increased last time to 10 mg daily.  His Requip was d/c.  Is not sure of the levodopa is helping but he continues to take carbidopa/levodopa 25/100, 1-1/2 tablets 4 times per day.  I did discontinue his Cymbalta last visit as he was using it for peripheral neuropathy, but had no symptoms of paresthesias.  He noticed no increase in paresthesias with the discontinuation of Cymbalta.  His mood remains good.  His wife states that his personality has been much better and he is not cursing like he was.  He continues to have some intermittent double vision.  He is exercising some, although not necessarily faithfully.  11/27/12 update:  This patient is accompanied in the office by his spouse who supplements the history.  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  He is currently on Aricept 10 mg daily.  He is on carbidopa/levodopa 25/100, 1-1/2 tablets 4 times per day.  Last visit, I cautiously added Artane, because of his biggest complaint of tremor.  He reports that tremor is markedly better.  He did hold his carbidopa/levodopa at my request prior to coming into the office today.  He really feels no different off of the medication.  His biggest complaint is significant knee pain.  It is preventing him from exercising.  I did review his MRI of the brain that was recently done.  There was ventricular prominence, but I thought that the frontotemporal atrophy was greater than the atrophy posteriorly.  12/29/12 update:  This patient is accompanied in the office by his spouse who supplements the history.  He is on artane and doing well.  Last visit, he did not think that the carbidopa/levodopa 25/100 was helpful and so we stopped it.  He got much  worse after this was stopped (falls, trouble getting out of the bed) and so he went to the PCP on Friday and it was restarted.    He is feeling much better.  His mood is better.  There are no hallucinations.  He does c/o hypophonic speech that is worsening.  02/23/13:  This patient is accompanied in the office by his spouse who supplements the history.  Last visit his carbidopa/levodopa was restarted as he got worse when he was off of the medication.  He takes it at 52 AM/12 PM/3 PM/6 PM.  He is doing well with this and much better now that he is back on the medication.  He is also on aricept 10 mg daily.  He still has spells where he has a significant personality change and is mean to his wife in public, but just as quickly it seems to go away.  They just returned from a cruise and he had a good time.    His only c/o is tremor in the L hand.  He is supposed to be on artane but they don't know what happened to that.  I called the pharmacy and it was last picked up in [place].  He was doing better when on it without significant cognitive change. He is in [place] and doing well with that.  05/25/13 update:  He is accompanied by his wife who supplements the history.  Pt is on levodopa at 8am/12pm/3pm/6pm.  I restarted his artane as he really wanted to retry.  I cautioned them about memory but his wife noted deterioration of memory when he went back on it and she stopped it in Jan.  He has been more moody and depressed.  He has had more balance problems.  He fell on Saturday when he bent over to pick up the dog food dish.  He was supposed to go back to OT but he got a bad sinus infection and then he just got worse and was unmotivated to go back.  If he gets up in the middle of the night, he will sometimes be confused as to how to get the bathroom.  No hallucinations.    07/21/13 update:  Pt presents with wife, who supplements the history.  Is having trouble sleeping at night but is sleeping during the day.  Snores per wife.   Falls asleep watching movie or just when sitting down will doze off.  Still having compulsive eating/buying.  Bought a truck behind wife's back and becoming marial issue.   Having trouble losing weight.  Becoming aggressive with wife at times.  Added lexapro last visit but not helping.   08/11/13 update:  The patient presents today accompanied by his wife who supplements the history.  The patient  has been sleeping better.  The patient reports that he feels somewhat more confused, but his wife disagrees.  The patient did decrease the levodopa and while that compulsive behaviors are better, his balance is much worse.  Overall, the patient feels that he is worse on the lower dose of levodopa, even though his wife is happy that the compulsive behaviors are better.  10/13/13 update:  The patient presents today accompanied by his wife who supplements the history.  Pt is on rytary now, 95 mg - 4 in the AM, 4 in the afternoon and 3 in the evening.  Compulsive behaviors seem better.   Wife states that pt is moody and "mean and angry."  Has had more loss of balance; tells me was just put on a new bladder medication about a month ago and it has really helped.  Gait instability started a month ago.    12/29/13 update:  This patient is accompanied in the office by his spouse who supplements the history.  Pt on rytary 95mg  - 4 in the AM, 4 in the afternoon and 3 in the evening.   They don't think that it has been as effective as in the past and it has been very costly.  Pt c/o more difficulty with coordination but thinks that balance is a little better.  States that steps are smaller and drags R leg.  Dragging leg more for 2 months.  Memory has been "on and off."  Wellbutrin helped greatly with mood - wife states that is 75% better.  Asks about DBS surgery.  Last Thursday, pt was watching TV and noted acute onset of vision change (blurry per pt) on the right side.  He went to bed and it was the same way the next day.  He had  an appt the next day with a dr as he was in a research trial for cholesterol and he told him he thought had a stroke.  He f/u with Dr. Dennard Schaumann.  He saw Dr. Katy Fitch as well and was told that he had a little stroke behind his retina.  He has his carotid u/s tomorrow.  He has an MRI scheduled for thurs night.  He still cannot see anything in the upper quadrant out of the right eye but can see in the lower quandrant but can see out of the left eye.  He was on asa and plavix at the time of the event as well as lipitor.  He was previously on plavix for his cardiac stent.  03/30/14 update:  The patient returns today for follow-up, accompanied by his wife who supplements the history.  Records that were made available to me since last visit were reviewed.  The patient is currently on Rytary 95 mg, 4 in the morning, 3 in afternoon and 4 in the evening.  He had neuropsych testing done by Dr. Leonides Schanz since last visit.  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  Pt got very depressed after this.  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  This demonstrated 1-39% stenosis bilaterally.  He had an echocardiogram on 12/30/2013 that demonstrated a normal left ventricular ejection fraction of 60-65%.  I did change his aspirin and Plavix that he was previously on to Aggrenox.  He has tolerated this well and has had no more episodes.  He had an MRI of the brain since  last visit that was negative for acute infarct.  He did go to the emergency room on 01/03/2015 after he fell and hit his head.  He had a CT of the brain then and it was negative.  Just a few days later on 01/07/2015 he followed up with his cardiologist and was dizzy and was found to be orthostatic. His Lasix was d/c.  Dizziness was much better.   He had labs that were essentially unremarkable.  He went to the emergency room again on 01/17/2015 after a fall  the prior evening after reaching to open the bedroom door.  He hit his elbow this time and x-rays of the elbow were negative.  He has not been faithfully using the walker in the home.  They are planning on going on some upcoming cruise.  07/13/14 update:  The patient returns today for follow-up, accompanied by his wife who supplements the history.  Records that were made available to me since last visit were reviewed.  The patient is currently on Rytary 95 mg, 4 in the morning, 3 in afternoon and 4 in the evening.  He has had a significant decline.  He is falling 2-3 times a day and his wife can no longer pick him up from the floor.  No serious injuries.  He will fall using the walker and not using it.  He is dragging the right leg.  Even with the lift chair, it takes 10 min to get out of it.  He is on Aricept, 10 mg daily for memory change. His thought process has also decreased.   Last visit, I increased his Wellbutrin from 150 mg daily to 300 mg daily for depression.  Mood is actually better.   [place] are going [place] in [place] and will be gone for 7 days. They asked for some medication for nausea for the cruise. He will have a scooter for the ship.    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     PREVIOUS MEDICATIONS: Requip (compulsive spending), Cymbalta  ALLERGIES:   Allergies  Allergen Reactions  . Morphine Hives    hyperventilate  . Hydrocodone Rash    hyperventilate  . Sulfa Antibiotics Rash    hyperventilate    CURRENT MEDICATIONS:  Current Outpatient Prescriptions on File Prior to Visit  Medication Sig Dispense Refill  . albuterol (PROVENTIL HFA;VENTOLIN HFA) 108 (90 BASE) MCG/ACT inhaler Inhale 2 puffs into the lungs every 6 (six) hours as needed for wheezing or shortness of breath. 1 Inhaler 0  . buPROPion (WELLBUTRIN XL) 300 MG 24 hr tablet Take 1  tablet (300 mg total) by mouth daily. 30 tablet 11  . Carbidopa-Levodopa ER (RYTARY) 23.75-95 MG CPCR Take 4 tablets by mouth 3 (three) times daily. Take 4 tablets in am, 4 tablets in the afternoon, 3 tablets in the evening 330 capsule 5  . dipyridamole-aspirin (AGGRENOX) 200-25 MG per 12 hr capsule TAKE 1 CAPSULE BY MOUTH 2 (TWO) TIMES DAILY. 60 capsule 5  . donepezil (ARICEPT) 10 MG tablet Take 1 tablet (10 mg total) by mouth at bedtime. 90 tablet 3  . glipiZIDE (GLUCOTROL) 5 MG tablet TAKE 1 TABLET (5 MG TOTAL) BY MOUTH 2 (TWO) TIMES DAILY BEFORE A MEAL. 60 tablet 3  . losartan (COZAAR) 100 MG tablet TAKE 1 TABLET BY MOUTH EVERY DAY  30 tablet 11  . mirtazapine (REMERON) 15 MG tablet TAKE 1 TABLET BY MOUTH AT BEDTIME 30 tablet 5  . Multiple Vitamin (MULTIVITAMIN) capsule Take 1 capsule by mouth daily.      Marland Kitchen oxybutynin (DITROPAN) 5 MG tablet Take 2.5 mg by mouth 3 (three) times daily.     . nitroGLYCERIN (NITROSTAT) 0.4 MG SL tablet Place 1 tablet (0.4 mg total) under the tongue every 5 (five) minutes as needed. (Patient not taking: Reported on 07/13/2014) 25 tablet 11  . torsemide (DEMADEX) 20 MG tablet Take 1 tablet (20 mg total) by mouth 2 (two) times daily. (Patient not taking: Reported on 07/13/2014) 60 tablet 5  . [DISCONTINUED] rOPINIRole (REQUIP) 1 MG tablet Take 1 mg by mouth 3 (three) times daily.      No current facility-administered medications on file prior to visit.    PAST MEDICAL HISTORY:   Past Medical History  Diagnosis Date  . Coronary artery disease   . Hypertension   . Diabetes mellitus   . Hypercholesteremia   . BPH (benign prostatic hypertrophy)   . DJD (degenerative joint disease)   . Asthma   . Memory loss   . Ventral hernia   . GERD (gastroesophageal reflux disease)   . Allergic rhinitis   . Parkinsonian syndrome   . Hypogonadism male   . Neuropathy of leg     Bilateral  . Depression   . Parkinson disease   . NAION (non-arteritic anterior ischemic optic  neuropathy), right eye     Followed by Dr. Katy Fitch    PAST SURGICAL HISTORY:   Past Surgical History  Procedure Laterality Date  . Coronary stent placement  2003  . Shoulder surgery  2011    rotator cuff    SOCIAL HISTORY:   History   Social History  . Marital Status: Married    Spouse Name: N/A  . Number of Children: 2  . Years of Education: 14   Occupational History  . retired     Psychologist, sport and exercise   Social History Main Topics  . Smoking status: Former Smoker    Quit date: 02/26/1998  . Smokeless tobacco: Never Used  . Alcohol Use: Yes     Comment: Occ  . Drug Use: No  . Sexual Activity:    Partners: Female   Other Topics Concern  . Not on file   Social History Narrative   Pt lives at home with his spouse.   Caffeine Use: Does not consume    FAMILY HISTORY:   Family Status  Relation Status Death Age  . Father Deceased 61    CHF  . Mother Deceased     pneumonia  . Child Alive     2, healthy    ROS:  A complete 10 system review of systems was obtained and was unremarkable apart from what is mentioned above.  PHYSICAL EXAMINATION:    VITALS:   Filed Vitals:   07/13/14 0958  BP: 152/62  Pulse: 92  Height: 5\' 9"  (1.753 m)  Weight: 222 lb (100.699 kg)   Wt Readings from Last 3 Encounters:  07/13/14 222 lb (100.699 kg)  03/30/14 240 lb (108.863 kg)  03/18/14 238 lb (107.956 kg)    GEN:  The patient appears stated age and is in NAD. HEENT:  Normocephalic, atraumatic.  The mucous membranes are moist. The superficial temporal arteries are without ropiness or tenderness. CV:  RRR Lungs:  CTAB Neck/HEME:  There are no carotid bruits bilaterally.  Neurological  examination:  Orientation: He is alert and oriented to person, place and month.  A MoCA was performed 08/11/13 and the patient scored 18/30.  A MoCA was performed in 01/2013 and the pt scored 17/30.   Relies on wife for hx.  Cranial nerves: There is good facial symmetry.  There is mild left ptosis.  There  is facial hypomimia.  Pupils are equal round and reactive to light bilaterally. Fundoscopic exam reveals clear margins bilaterally.Trouble/paralysis with both upgaze and downgaze.  There are definite square wave jerks.  The visual fields are full to confrontational testing. The speech is fluent and clear.  At times, it becomes quite hypophonic and he has significant difficulty with the guttural sounds.  Soft palate rises symmetrically and there is no tongue deviation. Hearing is intact to conversational tone. Motor: Strength is 5/5 in the bilateral upper and lower extremities.   Shoulder shrug is equal and symmetric.  There is no pronator drift. Deep tendon reflexes: Deep tendon reflexes are 3/4 at the bilateral biceps, triceps, brachioradialis, patella and achilles. Plantar response is upgoing on the left and downgoing on the right.  Movement examination: Tone: There is normal tone in the bilateral upper extremities.  The tone in the lower extremities is normal.  Abnormal movements: There is a mild to moderate left upper extremity resting tremor. There is no tremor in the legs Coordination:  There is minimal decremation with hand opening and closing on the left, but finger taps were good bilaterally.  He has difficulty with heel taps and toe taps bilaterally. Gait and Station: The patient requires assistance to get out of the chair today.  His right leg sticks to the ground.  He continually wants to festinate and has to be reminded to stop leaning forward.  He is very unstable.   ASSESSMENT/PLAN:  1.  PSP  -Long discussion with the patient and his wife.  Much greater than 50% of this 45 minute visit spent in counseling.  I told him that I do not think he should be walking any longer, even with the walker.  I think he should be in a wheelchair at all times.  He already has a transport chair that his children have actually purchased for him.  I think he will also need a more sophisticated chair for  home.  I am [facility].  There are approximately 4 stairs up into the patient's home and we talked about getting a ramp.  They think that their church will help them build it.  I stressed to him that I do not want him walking at all.  We talked about the risk of hip fracture and subsequent morbidity/mortality associated with hip fractures.  Understanding was expressed.  -He will continue on the aricept 10 mg daily.    -We will continue Rytary 95 mg, 4 tablets in the morning, 3 tablets in the afternoon, 4 tablets in the evening.  Samples were provided.  -He will continue off the artane as that made him worse 2.    Peripheral neuropathy, likely secondary to diabetes.  -He is off of Cymbalta and doing well. 3.  Depression  -doing better with anger with wellbutrin xl 300 mg daily. 4.  EDS with [facility]  -On CPAP 5.  Anterior ischemic optic neuropathy  -His recent carotid and echo were unremarkable.  Contributes to vision issues along with lack of vertical mobility of eyes.   6.  Motion  sickness  -going on cruise.  Filled zofran.  If he uses the scopolamine provided by the cruise, cautioned him not to touch behind the ear once the patch is placed.  Cautioned him to use his scooter that is provided for him on the cruise at all times. 7.  Return in about 4 months (around 11/13/2014).

## 2014-07-13 NOTE — Patient Instructions (Signed)
1. You have been referred to [facility] for a wheelchair evaluation. They will call you directly to schedule an appointment.  Please call [phone] if you do not hear from them.  2. Zofran RX sent to [facility]. Rytary samples given.

## 2014-07-14 ENCOUNTER — Telehealth: Payer: Self-pay | Admitting: Neurology

## 2014-07-14 NOTE — Telephone Encounter (Signed)
Last office note mailed to patient to take to the [place].

## 2014-07-15 ENCOUNTER — Other Ambulatory Visit: Payer: Self-pay | Admitting: Family Medicine

## 2014-07-15 NOTE — Telephone Encounter (Signed)
Pt states not taking Demedex, refill denied

## 2014-08-03 ENCOUNTER — Emergency Department (HOSPITAL_COMMUNITY): Payer: Medicare Other

## 2014-08-03 ENCOUNTER — Emergency Department (HOSPITAL_COMMUNITY)
Admission: EM | Admit: 2014-08-03 | Discharge: 2014-08-03 | Disposition: A | Discharge status: Home / Self Care | Payer: Medicare Other | Attending: Emergency Medicine | Admitting: Emergency Medicine

## 2014-08-03 ENCOUNTER — Other Ambulatory Visit: Payer: Self-pay | Admitting: Neurology

## 2014-08-03 ENCOUNTER — Encounter (HOSPITAL_COMMUNITY): Payer: Self-pay

## 2014-08-03 DIAGNOSIS — Z8719 Personal history of other diseases of the digestive system: Secondary | ICD-10-CM | POA: Insufficient documentation

## 2014-08-03 DIAGNOSIS — F329 Major depressive disorder, single episode, unspecified: Secondary | ICD-10-CM | POA: Insufficient documentation

## 2014-08-03 DIAGNOSIS — W01198A Fall on same level from slipping, tripping and stumbling with subsequent striking against other object, initial encounter: Secondary | ICD-10-CM | POA: Diagnosis not present

## 2014-08-03 DIAGNOSIS — Z8673 Personal history of transient ischemic attack (TIA), and cerebral infarction without residual deficits: Secondary | ICD-10-CM | POA: Insufficient documentation

## 2014-08-03 DIAGNOSIS — S39012A Strain of muscle, fascia and tendon of lower back, initial encounter: Secondary | ICD-10-CM | POA: Diagnosis not present

## 2014-08-03 DIAGNOSIS — Y92002 Bathroom of unspecified non-institutional (private) residence single-family (private) house as the place of occurrence of the external cause: Secondary | ICD-10-CM | POA: Diagnosis not present

## 2014-08-03 DIAGNOSIS — I10 Essential (primary) hypertension: Secondary | ICD-10-CM | POA: Insufficient documentation

## 2014-08-03 DIAGNOSIS — E119 Type 2 diabetes mellitus without complications: Secondary | ICD-10-CM | POA: Diagnosis not present

## 2014-08-03 DIAGNOSIS — I2510 Atherosclerotic heart disease of native coronary artery without angina pectoris: Secondary | ICD-10-CM | POA: Diagnosis not present

## 2014-08-03 DIAGNOSIS — S3992XA Unspecified injury of lower back, initial encounter: Secondary | ICD-10-CM | POA: Diagnosis present

## 2014-08-03 DIAGNOSIS — Z8739 Personal history of other diseases of the musculoskeletal system and connective tissue: Secondary | ICD-10-CM | POA: Diagnosis not present

## 2014-08-03 DIAGNOSIS — G20 Parkinson's disease: Secondary | ICD-10-CM | POA: Diagnosis not present

## 2014-08-03 DIAGNOSIS — Y9389 Activity, other specified: Secondary | ICD-10-CM | POA: Diagnosis not present

## 2014-08-03 DIAGNOSIS — Y998 Other external cause status: Secondary | ICD-10-CM | POA: Insufficient documentation

## 2014-08-03 DIAGNOSIS — Z87891 Personal history of nicotine dependence: Secondary | ICD-10-CM | POA: Diagnosis not present

## 2014-08-03 DIAGNOSIS — J45909 Unspecified asthma, uncomplicated: Secondary | ICD-10-CM | POA: Insufficient documentation

## 2014-08-03 DIAGNOSIS — Z79899 Other long term (current) drug therapy: Secondary | ICD-10-CM | POA: Diagnosis not present

## 2014-08-03 DIAGNOSIS — Z9861 Coronary angioplasty status: Secondary | ICD-10-CM | POA: Insufficient documentation

## 2014-08-03 HISTORY — DX: Cerebral infarction, unspecified: I63.9

## 2014-08-03 MED ORDER — OXYCODONE-ACETAMINOPHEN 5-325 MG PO TABS: 1.0000 | ORAL_TABLET | Freq: Four times a day (QID) | ORAL | Status: AC | PRN

## 2014-08-03 MED ORDER — OXYCODONE-ACETAMINOPHEN 5-325 MG PO TABS
1.0000 | ORAL_TABLET | Freq: Once | ORAL | Status: AC
  Administered 2014-08-03: 1 via ORAL
  Filled 2014-08-03: qty 1

## 2014-08-03 MED ORDER — CYCLOBENZAPRINE HCL 5 MG PO TABS: 5.0000 mg | ORAL_TABLET | Freq: Two times a day (BID) | ORAL | Status: DC | PRN

## 2014-08-03 NOTE — ED Provider Notes (Signed)
CSN: [id number]     Arrival date & time 08/03/14  1215 History  This chart was scribed for Orpah Greek, MD by Rayna Sexton, ED scribe. This patient was seen in room [facility]10[facility] and the patient's care was started at 12:28 PM.    Chief Complaint  Patient presents with  . Fall    The history is provided by the patient and the spouse. No language interpreter was used.    HPI Comments: Jeremy Terry is a 69 y.o. male, with a history of Parkinsonian Syndrome, who presents to the Emergency Department complaining of a fall that occurred 1 hour ago. Pt's wife notes he fell onto his back and right trunk while drying himself off and was having trouble breathing s/p impact. Pt notes associated moderate back pain and stiffness. Pt denies any other symptoms.   Past Medical History  Diagnosis Date  . Coronary artery disease   . Hypertension   . Diabetes mellitus   . Hypercholesteremia   . BPH (benign prostatic hypertrophy)   . DJD (degenerative joint disease)   . Asthma   . Memory loss   . Ventral hernia   . GERD (gastroesophageal reflux disease)   . Allergic rhinitis   . Parkinsonian syndrome   . Hypogonadism male   . Neuropathy of leg     Bilateral  . Depression   . Parkinson disease   . NAION (non-arteritic anterior ischemic optic neuropathy), right eye     Followed by Dr. Katy Fitch  . Stroke    Past Surgical History  Procedure Laterality Date  . Coronary stent placement  2003  . Shoulder surgery  2011    rotator cuff   Family History  Problem Relation Age of Onset  . Other Father     respiratory problems   History  Substance Use Topics  . Smoking status: Former Smoker    Quit date: 02/26/1998  . Smokeless tobacco: Never Used  . Alcohol Use: Yes     Comment: Occ    Review of Systems  Musculoskeletal: Positive for myalgias and back pain.  All other systems reviewed and are negative.     Allergies  Morphine; Hydrocodone; and Sulfa antibiotics  Home  Medications   Prior to Admission medications   Medication Sig Start Date End Date Taking? Authorizing Provider  albuterol (PROVENTIL HFA;VENTOLIN HFA) 108 (90 BASE) MCG/ACT inhaler Inhale 2 puffs into the lungs every 6 (six) hours as needed for wheezing or shortness of breath. 05/01/13  Yes Susy Frizzle, MD  buPROPion (WELLBUTRIN XL) 300 MG 24 hr tablet Take 1 tablet (300 mg total) by mouth daily. 03/30/14  Yes Rebecca S Tat, DO  Carbidopa-Levodopa ER (RYTARY) 23.75-95 MG CPCR Take 4 tablets by mouth 3 (three) times daily. Take 4 tablets in am, 4 tablets in the afternoon, 3 tablets in the evening 08/25/13  Yes Rebecca S Tat, DO  Carbidopa-Levodopa ER (RYTARY) 23.75-95 MG CPCR Take 3 tablets by mouth 3 (three) times daily. 4 / 4/ 3 07/13/14  Yes Rebecca S Tat, DO  donepezil (ARICEPT) 10 MG tablet Take 1 tablet (10 mg total) by mouth at bedtime. 03/30/14  Yes Rebecca S Tat, DO  Furosemide (LASIX PO) Take 10 mg by mouth as needed.    Yes Historical Provider, MD  glipiZIDE (GLUCOTROL) 5 MG tablet TAKE 1 TABLET (5 MG TOTAL) BY MOUTH 2 (TWO) TIMES DAILY BEFORE A MEAL. 07/05/14  Yes Susy Frizzle, MD  losartan (COZAAR) 100 MG tablet TAKE  1 TABLET BY MOUTH EVERY DAY 07/05/14  Yes Susy Frizzle, MD  mirtazapine (REMERON) 15 MG tablet TAKE 1 TABLET BY MOUTH AT BEDTIME 01/04/14  Yes Rebecca S Tat, DO  Multiple Vitamin (MULTIVITAMIN) capsule Take 1 capsule by mouth daily.     Yes Historical Provider, MD  nitroGLYCERIN (NITROSTAT) 0.4 MG SL tablet Place 1 tablet (0.4 mg total) under the tongue every 5 (five) minutes as needed. 08/07/11  Yes Josue Hector, MD  oxybutynin (DITROPAN) 5 MG tablet Take 2.5 mg by mouth 3 (three) times daily.  12/23/13  Yes Historical Provider, MD  dipyridamole-aspirin (AGGRENOX) 200-25 MG per 12 hr capsule TAKE 1 CAPSULE BY MOUTH 2 (TWO) TIMES DAILY. Patient not taking: Reported on 08/03/2014 07/06/14   Susy Frizzle, MD  ondansetron (ZOFRAN ODT) 4 MG disintegrating tablet Take 1 tablet  (4 mg total) by mouth every 8 (eight) hours as needed for nausea or vomiting. Patient not taking: Reported on 08/03/2014 07/13/14   Eustace Quail Tat, DO  torsemide (DEMADEX) 20 MG tablet Take 1 tablet (20 mg total) by mouth 2 (two) times daily. Patient not taking: Reported on 07/13/2014 02/11/14   Josue Hector, MD   BP 128/78 mmHg  Pulse 77  Temp(Src) 97.9 F (36.6 C) (Oral)  Resp 16  Ht 5\' 8"  (1.727 m)  Wt 235 lb (106.595 kg)  BMI 35.74 kg/m2  SpO2 96% Physical Exam  Constitutional: He is oriented to person, place, and time. He appears well-developed and well-nourished. No distress.  HENT:  Head: Normocephalic and atraumatic.  Right Ear: Hearing normal.  Left Ear: Hearing normal.  Nose: Nose normal.  Mouth/Throat: Oropharynx is clear and moist and mucous membranes are normal.  Eyes: Conjunctivae and EOM are normal. Pupils are equal, round, and reactive to light.  Neck: Normal range of motion. Neck supple.  Cardiovascular: Regular rhythm, S1 normal and S2 normal.  Exam reveals no gallop and no friction rub.   No murmur heard. Pulmonary/Chest: Effort normal and breath sounds normal. No respiratory distress. He exhibits no tenderness.  Abdominal: Soft. Normal appearance and bowel sounds are normal. There is no hepatosplenomegaly. There is no tenderness. There is no rebound, no guarding, no tenderness at McBurney's point and negative Murphy's sign. No hernia.  Musculoskeletal: Normal range of motion.  Diffuse lumbar tenderness  Neurological: He is alert and oriented to person, place, and time. He has normal strength. No cranial nerve deficit or sensory deficit. Coordination normal. GCS eye subscore is 4. GCS verbal subscore is 5. GCS motor subscore is 6.  Skin: Skin is warm, dry and intact. No rash noted. No cyanosis.  Psychiatric: He has a normal mood and affect. His speech is normal and behavior is normal. Thought content normal.  Nursing note and vitals reviewed.   ED Course   Procedures  DIAGNOSTIC STUDIES: Oxygen Saturation is 96% on RA, normal by my interpretation.    COORDINATION OF CARE: 12:31 PM Discussed treatment plan with pt at bedside and pt agreed to plan.  Labs Review Labs Reviewed - No data to display  Imaging Review Dg Lumbar Spine Complete  08/03/2014   CLINICAL DATA:  Loss balance at home in bathroom and fell striking RIGHT side and RIGHT back, RIGHT side pain  EXAM: LUMBAR SPINE - COMPLETE 4+ VIEW  COMPARISON:  None.  FINDINGS: Osseous demineralization.  Five non-rib-bearing lumbar vertebra.  Levoconvex lumbar scoliosis.  Mild disc space narrowing L3-L4 and L4-L5.  Vertebral body heights maintained without fracture or  subluxation.  No spondylolysis.  SI joints symmetric.  IMPRESSION: Osseous demineralization.  Mild degenerative disc disease changes with levoconvex lumbar scoliosis.  No acute abnormalities.   Electronically Signed   By: Lavonia Dana M.D.   On: 08/03/2014 13:40     EKG Interpretation None      MDM   Final diagnoses:  None   lumbar strain/contusion  Presents to the ER for evaluation after a fall. Patient has Parkinson's, has trouble walking because of balance and gait. He had a fall earlier today, falling to his right side. He is complaining of pain in the low back area. Examination revealed diffuse low back pain and spasms with movement. No upper back tenderness, no chest tenderness. He did not hit his head, no loss of consciousness, no neck pain. X-ray of lumbar spine did not show any acute fracture. Patient will be treated with analgesia in the form of Percocet and Flexeril as needed.  I personally performed the services described in this documentation, which was scribed in my presence. The recorded information has been reviewed and is accurate.     Orpah Greek, MD 08/03/14 [phone]

## 2014-08-03 NOTE — Discharge Instructions (Signed)
Back Pain, Adult °Low back pain is very common. About 1 in 5 people have back pain. The cause of low back pain is rarely dangerous. The pain often gets better over time. About half of people with a sudden onset of back pain feel better in just 2 weeks. About 8 in 10 people feel better by 6 weeks.  °CAUSES °Some common causes of back pain include: °· Strain of the muscles or ligaments supporting the spine. °· Wear and tear (degeneration) of the spinal discs. °· Arthritis. °· Direct injury to the back. °DIAGNOSIS °Most of the time, the direct cause of low back pain is not known. However, back pain can be treated effectively even when the exact cause of the pain is unknown. Answering your caregiver's questions about your overall health and symptoms is one of the most accurate ways to make sure the cause of your pain is not dangerous. If your caregiver needs more information, he or she may order lab work or imaging tests (X-rays or MRIs). However, even if imaging tests show changes in your back, this usually does not require surgery. °HOME CARE INSTRUCTIONS °For many people, back pain returns. Since low back pain is rarely dangerous, it is often a condition that people can learn to manage on their own.  °· Remain active. It is stressful on the back to sit or stand in one place. Do not sit, drive, or stand in one place for more than 30 minutes at a time. Take short walks on level surfaces as soon as pain allows. Try to increase the length of time you walk each day. °· Do not stay in bed. Resting more than 1 or 2 days can delay your recovery. °· Do not avoid exercise or work. Your body is made to move. It is not dangerous to be active, even though your back may hurt. Your back will likely heal faster if you return to being active before your pain is gone. °· Pay attention to your body when you  bend and lift. Many people have less discomfort when lifting if they bend their knees, keep the load close to their bodies, and  avoid twisting. Often, the most comfortable positions are those that put less stress on your recovering back. °· Find a comfortable position to sleep. Use a firm mattress and lie on your side with your knees slightly bent. If you lie on your back, put a pillow under your knees. °· Only take over-the-counter or prescription medicines as directed by your caregiver. Over-the-counter medicines to reduce pain and inflammation are often the most helpful. Your caregiver may prescribe muscle relaxant drugs. These medicines help dull your pain so you can more quickly return to your normal activities and healthy exercise. °· Put ice on the injured area. °¨ Put ice in a plastic bag. °¨ Place a towel between your skin and the bag. °¨ Leave the ice on for 15-20 minutes, 03-04 times a day for the first 2 to 3 days. After that, ice and heat may be alternated to reduce pain and spasms. °· Ask your caregiver about trying back exercises and gentle massage. This may be of some benefit. °· Avoid feeling anxious or stressed. Stress increases muscle tension and can worsen back pain. It is important to recognize when you are anxious or stressed and learn ways to manage it. Exercise is a great option. °SEEK MEDICAL CARE IF: °· You have pain that is not relieved with rest or medicine. °· You have pain that does not improve in 1 week. °· You have new symptoms. °· You are generally not feeling well. °SEEK   IMMEDIATE MEDICAL CARE IF:  °· You have pain that radiates from your back into your legs. °· You develop new bowel or bladder control problems. °· You have unusual weakness or numbness in your arms or legs. °· You develop nausea or vomiting. °· You develop abdominal pain. °· You feel faint. °Document Released: 02/12/2005 Document Revised: 08/14/2011 Document Reviewed: 06/16/2013 °[employer]® Patient Information ©2015 [employer]. This information is not intended to replace advice given to you by your health care provider. Make sure you  discuss any questions you have with your health care provider. ° °Contusion °A contusion is a deep bruise. Contusions are the result of an injury that caused bleeding under the skin. The contusion may turn blue, purple, or yellow. Minor injuries will give you a painless contusion, but more severe contusions may stay painful and swollen for a few weeks.  °CAUSES  °A contusion is usually caused by a blow, trauma, or direct force to an area of the body. °SYMPTOMS  °· Swelling and redness of the injured area. °· Bruising of the injured area. °· Tenderness and soreness of the injured area. °· Pain. °DIAGNOSIS  °The diagnosis can be made by taking a history and physical exam. An X-ray, CT scan, or MRI may be needed to determine if there were any associated injuries, such as fractures. °TREATMENT  °Specific treatment will depend on what area of the body was injured. In general, the best treatment for a contusion is resting, icing, elevating, and applying cold compresses to the injured area. Over-the-counter medicines may also be recommended for pain control. Ask your caregiver what the best treatment is for your contusion. °HOME CARE INSTRUCTIONS  °· Put ice on the injured area. °¨ Put ice in a plastic bag. °¨ Place a towel between your skin and the bag. °¨ Leave the ice on for 15-20 minutes, 3-4 times a day, or as directed by your health care provider. °· Only take over-the-counter or prescription medicines for pain, discomfort, or fever as directed by your caregiver. Your caregiver may recommend avoiding anti-inflammatory medicines (aspirin, ibuprofen, and naproxen) for 48 hours because these medicines may increase bruising. °· Rest the injured area. °· If possible, elevate the injured area to reduce swelling. °SEEK IMMEDIATE MEDICAL CARE IF:  °· You have increased bruising or swelling. °· You have pain that is getting worse. °· Your swelling or pain is not relieved with medicines. °MAKE SURE YOU:  °· Understand these  instructions. °· Will watch your condition. °· Will get help right away if you are not doing well or get worse. °Document Released: 11/22/2004 Document Revised: 02/17/2013 Document Reviewed: 12/18/2010 °[employer]® Patient Information ©2015 [employer]. This information is not intended to replace advice given to you by your health care provider. Make sure you discuss any questions you have with your health care provider. ° °

## 2014-08-03 NOTE — ED Notes (Signed)
Patient fell at home hitting right side and his back on the bathroom scale. Patient denies any dizziness or other symptoms prior to falling. Patient hx of Parkinsons, weak shuffle gait. Patient c/o of pain to right side at this time.

## 2014-08-03 NOTE — ED Notes (Signed)
Er wife patient on blood thinner not sure which one

## 2014-08-04 ENCOUNTER — Other Ambulatory Visit: Payer: Self-pay | Admitting: *Deleted

## 2014-08-04 MED ORDER — MIRTAZAPINE 15 MG PO TABS: 15.0000 mg | ORAL_TABLET | Freq: Every day | ORAL | Status: AC

## 2014-08-04 NOTE — Telephone Encounter (Signed)
Rx sent 

## 2014-08-23 ENCOUNTER — Encounter: Payer: Self-pay | Admitting: Neurology

## 2014-08-23 ENCOUNTER — Encounter: Payer: Self-pay | Admitting: Family Medicine

## 2014-08-23 ENCOUNTER — Ambulatory Visit (INDEPENDENT_AMBULATORY_CARE_PROVIDER_SITE_OTHER): Payer: Medicare Other | Admitting: Neurology

## 2014-08-23 ENCOUNTER — Other Ambulatory Visit: Payer: Self-pay | Admitting: Family Medicine

## 2014-08-23 VITALS — BP 110/60 | HR 88 | Ht 69.0 in | Wt 215.6 lb

## 2014-08-23 DIAGNOSIS — G231 Progressive supranuclear ophthalmoplegia [Steele-Richardson-Olszewski]: Secondary | ICD-10-CM | POA: Diagnosis not present

## 2014-08-23 DIAGNOSIS — Z515 Encounter for palliative care: Secondary | ICD-10-CM

## 2014-08-23 DIAGNOSIS — R413 Other amnesia: Secondary | ICD-10-CM

## 2014-08-23 MED ORDER — GLIPIZIDE 5 MG PO TABS: 5.0000 mg | ORAL_TABLET | Freq: Two times a day (BID) | ORAL | Status: DC

## 2014-08-23 NOTE — Patient Instructions (Addendum)
1.  Drop mirtazipine - to 1/2 tablet at night for a month and then stop if doing okay 2.  We will send a hospice consult 3.  Call me if you don't get a call about the wheelchair evaluation

## 2014-08-23 NOTE — Progress Notes (Signed)
Jeremy Terry was seen today in the movement disorders clinic for neurologic consultation at the request of [name] [name], MD.  He was previously seen by Dr. [name].  They're requesting a second opinion regarding possible DBS surgery.  He has an appointment with [facility] in this regard as well.  The pt began with L hand tremor that began in 07/2009.  He was started on requip in 06/2010, but after 2 years on the medication, it was tapered off just 2 days ago because of compulsive spending.  When he was initally placed on the medication he had some nausea but his wife did think that it was effective. He has been on levodopa since 09/2011.  This was definitely effective.  He takes 1 1/2 tablets three to four times per day.  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  His wife notices that the medication may wear off about 30 minutes to one hour before the next dosage, as she will notice a resurgence of tremor.  He takes medication at 5 AM, 12 PM, often misses the 3 PM dose and then was taken another at 6 PM.  10/03/12:   The patient presents today with his wife who supplements the history.  They report that he did not get started on Comtan.  His pharmacy told them that it was backordered.  However, when we called the pharmacy and they stated that they had not received the prescription.  The patient did have a fall on July 16 resulting in a wrist fracture.  He was outside and fell over a bucket when he tripped. 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  He is currently taking his carbidopa/levodopa 25/100, 1-1/2 tablets at 8 AM/12 PM/3 M/6 PM.  He cannot tell when the medication wears off.  His wife states that it definitely does help.  The patient did go to therapy.  It was recommended that he use a cane.  He has not been doing that.  His wife recalls that he has been very impulsive.  He  is having major personality change.  He curses a lot and he never did this previously.  He has no hallucinations.  He is having intermittent diplopia.  10/31/12 update:  The pt presents with his wife who supplements the hx.  His Aricept was increased last time to 10 mg daily.  His Requip was d/c.  Is not sure of the levodopa is helping but he continues to take carbidopa/levodopa 25/100, 1-1/2 tablets 4 times per day.  I did discontinue his Cymbalta last visit as he was using it for peripheral neuropathy, but had no symptoms of paresthesias.  He noticed no increase in paresthesias with the discontinuation of Cymbalta.  His mood remains good.  His wife states that his personality has been much better and he is not cursing like he was.  He continues to have some intermittent double vision.  He is exercising some, although not necessarily faithfully.  11/27/12 update:  This patient is accompanied in the office by his spouse who supplements the history.  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  He is currently on Aricept 10 mg daily.  He is on carbidopa/levodopa 25/100, 1-1/2 tablets 4 times per day.  Last visit, I cautiously added Artane, because of his biggest complaint of tremor.  He reports that tremor is markedly better.  He did hold his carbidopa/levodopa at my request prior to coming into the office today.  He really feels no different off of the medication.  His biggest complaint is significant knee pain.  It is preventing him from exercising.  I did review his MRI of the brain that was recently done.  There was ventricular prominence, but I thought that the frontotemporal atrophy was greater than the atrophy posteriorly.  12/29/12 update:  This patient is accompanied in the office by his spouse who supplements the history.  He is on artane and doing well.  Last visit, he did not think that the carbidopa/levodopa 25/100 was helpful and so we stopped it.  He got much  worse after this was stopped (falls, trouble getting out of the bed) and so he went to the PCP on Friday and it was restarted.    He is feeling much better.  His mood is better.  There are no hallucinations.  He does c/o hypophonic speech that is worsening.  02/23/13:  This patient is accompanied in the office by his spouse who supplements the history.  Last visit his carbidopa/levodopa was restarted as he got worse when he was off of the medication.  He takes it at 52 AM/12 PM/3 PM/6 PM.  He is doing well with this and much better now that he is back on the medication.  He is also on aricept 10 mg daily.  He still has spells where he has a significant personality change and is mean to his wife in public, but just as quickly it seems to go away.  They just returned from a cruise and he had a good time.    His only c/o is tremor in the L hand.  He is supposed to be on artane but they don't know what happened to that.  I called the pharmacy and it was last picked up in [place].  He was doing better when on it without significant cognitive change. He is in [place] and doing well with that.  05/25/13 update:  He is accompanied by his wife who supplements the history.  Pt is on levodopa at 8am/12pm/3pm/6pm.  I restarted his artane as he really wanted to retry.  I cautioned them about memory but his wife noted deterioration of memory when he went back on it and she stopped it in Jan.  He has been more moody and depressed.  He has had more balance problems.  He fell on Saturday when he bent over to pick up the dog food dish.  He was supposed to go back to OT but he got a bad sinus infection and then he just got worse and was unmotivated to go back.  If he gets up in the middle of the night, he will sometimes be confused as to how to get the bathroom.  No hallucinations.    07/21/13 update:  Pt presents with wife, who supplements the history.  Is having trouble sleeping at night but is sleeping during the day.  Snores per wife.   Falls asleep watching movie or just when sitting down will doze off.  Still having compulsive eating/buying.  Bought a truck behind wife's back and becoming marial issue.   Having trouble losing weight.  Becoming aggressive with wife at times.  Added lexapro last visit but not helping.   08/11/13 update:  The patient presents today accompanied by his wife who supplements the history.  The patient  has been sleeping better.  The patient reports that he feels somewhat more confused, but his wife disagrees.  The patient did decrease the levodopa and while that compulsive behaviors are better, his balance is much worse.  Overall, the patient feels that he is worse on the lower dose of levodopa, even though his wife is happy that the compulsive behaviors are better.  10/13/13 update:  The patient presents today accompanied by his wife who supplements the history.  Pt is on rytary now, 95 mg - 4 in the AM, 4 in the afternoon and 3 in the evening.  Compulsive behaviors seem better.   Wife states that pt is moody and "mean and angry."  Has had more loss of balance; tells me was just put on a new bladder medication about a month ago and it has really helped.  Gait instability started a month ago.    12/29/13 update:  This patient is accompanied in the office by his spouse who supplements the history.  Pt on rytary 95mg  - 4 in the AM, 4 in the afternoon and 3 in the evening.   They don't think that it has been as effective as in the past and it has been very costly.  Pt c/o more difficulty with coordination but thinks that balance is a little better.  States that steps are smaller and drags R leg.  Dragging leg more for 2 months.  Memory has been "on and off."  Wellbutrin helped greatly with mood - wife states that is 75% better.  Asks about DBS surgery.  Last Thursday, pt was watching TV and noted acute onset of vision change (blurry per pt) on the right side.  He went to bed and it was the same way the next day.  He had  an appt the next day with a dr as he was in a research trial for cholesterol and he told him he thought had a stroke.  He f/u with Dr. Dennard Schaumann.  He saw Dr. Katy Fitch as well and was told that he had a little stroke behind his retina.  He has his carotid u/s tomorrow.  He has an MRI scheduled for thurs night.  He still cannot see anything in the upper quadrant out of the right eye but can see in the lower quandrant but can see out of the left eye.  He was on asa and plavix at the time of the event as well as lipitor.  He was previously on plavix for his cardiac stent.  03/30/14 update:  The patient returns today for follow-up, accompanied by his wife who supplements the history.  Records that were made available to me since last visit were reviewed.  The patient is currently on Rytary 95 mg, 4 in the morning, 3 in afternoon and 4 in the evening.  He had neuropsych testing done by Dr. Leonides Schanz since last visit.  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  Pt got very depressed after this.  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  This demonstrated 1-39% stenosis bilaterally.  He had an echocardiogram on 12/30/2013 that demonstrated a normal left ventricular ejection fraction of 60-65%.  I did change his aspirin and Plavix that he was previously on to Aggrenox.  He has tolerated this well and has had no more episodes.  He had an MRI of the brain since  last visit that was negative for acute infarct.  He did go to the emergency room on 01/03/2015 after he fell and hit his head.  He had a CT of the brain then and it was negative.  Just a few days later on 01/07/2015 he followed up with his cardiologist and was dizzy and was found to be orthostatic. His Lasix was d/c.  Dizziness was much better.   He had labs that were essentially unremarkable.  He went to the emergency room again on 01/17/2015 after a fall  the prior evening after reaching to open the bedroom door.  He hit his elbow this time and x-rays of the elbow were negative.  He has not been faithfully using the walker in the home.  They are planning on going on some upcoming cruise.  07/13/14 update:  The patient returns today for follow-up, accompanied by his wife who supplements the history.  Records that were made available to me since last visit were reviewed.  The patient is currently on Rytary 95 mg, 4 in the morning, 3 in afternoon and 4 in the evening.  He has had a significant decline.  He is falling 2-3 times a day and his wife can no longer pick him up from the floor.  No serious injuries.  He will fall using the walker and not using it.  He is dragging the right leg.  Even with the lift chair, it takes 10 min to get out of it.  He is on Aricept, 10 mg daily for memory change. His thought process has also decreased.   Last visit, I increased his Wellbutrin from 150 mg daily to 300 mg daily for depression.  Mood is actually better.   [place] are going on [place] cruise in 9 days and will be gone for 7 days. They asked for some medication for nausea for the cruise. He will have a scooter for the ship.    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    08/23/14 update:  Patient is following up today, accompanied by his wife who supplements the history.  He has a history of progressive supranuclear palsy.  Last visit, I told him he needed to discontinue his walking altogether.  I sent him for a wheelchair evaluation at the neuro-rehabilitation center.  They apparently never contacted the patient.  They went on a cruise since last visit and enjoyed it.  He used a scooter for the cruise and a Environmental manager.  I do see that he went to the emergency room on 08/03/2014 after a fall.  He was evaluated for low back pain and released.  He  remains on Rytary 95 mg, 4 tablets in the morning, 3 in the afternoon and 4 in the evening.  He is on Aricept 10 mg for memory change.  They c/o worsening memory but on further questioning, his wife states that he "forgets to pick up his feet when walking."  She is having more and more difficulty taking care of him.  He no longer has bladder control and has to wear diapers.  She is having more back pain trying to lift him and change his clothing.  He is tired and overall frustrated, but states that his mood is better than it used to be.  He is sleeping well at night.   PREVIOUS MEDICATIONS:  Requip (compulsive spending), Cymbalta  ALLERGIES:   Allergies  Allergen Reactions  . Morphine Hives    hyperventilate  . Hydrocodone Rash    hyperventilate  . Sulfa Antibiotics Rash    hyperventilate    CURRENT MEDICATIONS:  Current Outpatient Prescriptions on File Prior to Visit  Medication Sig Dispense Refill  . albuterol (PROVENTIL HFA;VENTOLIN HFA) 108 (90 BASE) MCG/ACT inhaler Inhale 2 puffs into the lungs every 6 (six) hours as needed for wheezing or shortness of breath. 1 Inhaler 0  . buPROPion (WELLBUTRIN XL) 300 MG 24 hr tablet Take 1 tablet (300 mg total) by mouth daily. 30 tablet 11  . Carbidopa-Levodopa ER (RYTARY) 23.75-95 MG CPCR Take 4 tablets by mouth 3 (three) times daily. Take 4 tablets in am, 4 tablets in the afternoon, 3 tablets in the evening 330 capsule 5  . Carbidopa-Levodopa ER (RYTARY) 23.75-95 MG CPCR Take 3 tablets by mouth 3 (three) times daily. 4 / 4/ 3 350 capsule 0  . cyclobenzaprine (FLEXERIL) 5 MG tablet Take 1 tablet (5 mg total) by mouth 2 (two) times daily as needed for muscle spasms. 15 tablet 0  . dipyridamole-aspirin (AGGRENOX) 200-25 MG per 12 hr capsule TAKE 1 CAPSULE BY MOUTH 2 (TWO) TIMES DAILY. 60 capsule 5  . donepezil (ARICEPT) 10 MG tablet Take 1 tablet (10 mg total) by mouth at bedtime. 90 tablet 3  . Furosemide (LASIX PO) Take 10 mg by mouth as needed.      Marland Kitchen glipiZIDE (GLUCOTROL) 5 MG tablet TAKE 1 TABLET (5 MG TOTAL) BY MOUTH 2 (TWO) TIMES DAILY BEFORE A MEAL. 60 tablet 3  . losartan (COZAAR) 100 MG tablet TAKE 1 TABLET BY MOUTH EVERY DAY 30 tablet 11  . mirtazapine (REMERON) 15 MG tablet Take 1 tablet (15 mg total) by mouth at bedtime. 30 tablet 5  . Multiple Vitamin (MULTIVITAMIN) capsule Take 1 capsule by mouth daily.      . nitroGLYCERIN (NITROSTAT) 0.4 MG SL tablet Place 1 tablet (0.4 mg total) under the tongue every 5 (five) minutes as needed. 25 tablet 11  . ondansetron (ZOFRAN ODT) 4 MG disintegrating tablet Take 1 tablet (4 mg total) by mouth every 8 (eight) hours as needed for nausea or vomiting. 20 tablet 0  . oxybutynin (DITROPAN) 5 MG tablet Take 2.5 mg by mouth 3 (three) times daily.     Marland Kitchen oxyCODONE-acetaminophen (PERCOCET/ROXICET) 5-325 MG per tablet Take 1 tablet by mouth every 6 (six) hours as needed for severe pain. 20 tablet 0  . torsemide (DEMADEX) 20 MG tablet Take 1 tablet (20 mg total) by mouth 2 (two) times daily. 60 tablet 5  . [DISCONTINUED] rOPINIRole (REQUIP) 1 MG tablet Take 1 mg by mouth 3 (three) times daily.      No current facility-administered medications on file prior to visit.    PAST MEDICAL HISTORY:   Past Medical History  Diagnosis Date  . Coronary artery disease   . Hypertension   . Diabetes mellitus   . Hypercholesteremia   . BPH (benign prostatic hypertrophy)   . DJD (degenerative joint disease)   . Asthma   . Memory loss   . Ventral hernia   . GERD (gastroesophageal reflux disease)   . Allergic rhinitis   . Parkinsonian syndrome   . Hypogonadism male   . Neuropathy of leg     Bilateral  . Depression   . Parkinson disease   . NAION (non-arteritic anterior ischemic optic neuropathy), right eye  Followed by Dr. Katy Fitch  . Stroke     PAST SURGICAL HISTORY:   Past Surgical History  Procedure Laterality Date  . Coronary stent placement  2003  . Shoulder surgery  2011    rotator cuff     SOCIAL HISTORY:   History   Social History  . Marital Status: Married    Spouse Name: N/A  . Number of Children: 2  . Years of Education: 14   Occupational History  . retired     Psychologist, sport and exercise   Social History Main Topics  . Smoking status: Former Smoker    Quit date: 02/26/1998  . Smokeless tobacco: Never Used  . Alcohol Use: 0.0 oz/week    0 Standard drinks or equivalent per week     Comment: Occ  . Drug Use: No  . Sexual Activity:    Partners: Female   Other Topics Concern  . Not on file   Social History Narrative   Pt lives at home with his spouse.   Caffeine Use: Does not consume    FAMILY HISTORY:   Family Status  Relation Status Death Age  . Father Deceased 47    CHF  . Mother Deceased     pneumonia  . Child Alive     2, healthy    ROS:  A complete 10 system review of systems was obtained and was unremarkable apart from what is mentioned above.  PHYSICAL EXAMINATION:    VITALS:   Filed Vitals:   08/23/14 1101  BP: 110/60  Pulse: 88  Height: 5\' 9"  (1.753 m)  Weight: 215 lb 9 oz (97.779 kg)  SpO2: 97%   Wt Readings from Last 3 Encounters:  08/23/14 215 lb 9 oz (97.779 kg)  08/03/14 235 lb (106.595 kg)  07/13/14 222 lb (100.699 kg)    GEN:  The patient appears stated age and is in NAD. HEENT:  Normocephalic, atraumatic.  The mucous membranes are moist. The superficial temporal arteries are without ropiness or tenderness. CV:  RRR Lungs:  CTAB Neck/HEME:  There are no carotid bruits bilaterally.  Neurological examination:  Orientation: He is alert and oriented to person, place and month.  A MoCA was performed 08/11/13 and the patient scored 18/30.  A MoCA was performed in 01/2013 and the pt scored 17/30.   Relies on wife for hx.  Cranial nerves: There is good facial symmetry.  There is mild left ptosis.  There is facial hypomimia.  Pupils are equal round and reactive to light bilaterally. .Trouble/paralysis with both upgaze and downgaze.  He  really only has gaze to the left as right horizontal gaze is not paralyzed as well.  There are definite square wave jerks.   The speech is fluent and clear.  At times, it becomes quite hypophonic and he has significant difficulty with the guttural sounds.  Soft palate rises symmetrically and there is no tongue deviation. Hearing is intact to conversational tone. Motor: Not tested today Deep tendon reflexes: Not tested today  Movement examination: Tone: There is normal tone in the bilateral upper extremities.  The tone in the lower extremities is normal.  Abnormal movements: There is a mild to moderate left upper extremity resting tremor. There is no tremor in the legs Coordination:  There is minimal decremation with hand opening and closing on the left, but finger taps were good bilaterally.  He has difficulty with heel taps and toe taps bilaterally. Gait and Station: not tested.  In a transport chair.  ASSESSMENT/PLAN:  1.  PSP  -Long discussion with the patient and his wife.  Much greater than 50% of this 40 minute visit spent in counseling.  We talked about end-of-life issues.  His wife is having progressive increasing difficulties taking care of him in the home.  We talked about hospice.  I think he would potentially be a good hospice candidate.  It would offer services that he and his wife could benefit from.  He is already a DO NOT RESUSCITATE.  I asked him to go home and think about it, and he almost immediately replied that his wife needed help and I needed to put in for a hospice consult.  I had seen a significant decline in the patient.  He requires assistance for virtually all activities of daily living.    -I will check with the neuro rehabilitation center about why they have not contacted his wife about a wheelchair.     -He will continue on the aricept 10 mg daily.    -We will continue Rytary 95 mg, 4 tablets in the morning, 3 tablets in the afternoon, 4 tablets in the evening.   Samples were provided. 2.    Peripheral neuropathy, likely secondary to diabetes.  -He is off of Cymbalta and doing well. 3.  Depression  -doing better with anger with wellbutrin xl 300 mg daily.  -Going to try to wean him off of the Remeron.   4.  EDS with [facility]  -On CPAP 5.  Anterior ischemic optic neuropathy  -His recent carotid and echo were unremarkable.  Contributes to vision issues along with lack of vertical mobility of eyes.   6.  follow-up with me in the next 4 months, sooner should new neurologic issues arise.

## 2014-08-23 NOTE — Telephone Encounter (Signed)
Medication refill for one time only.  Patient needs to be seen.  Letter sent for patient to call and schedule 

## 2014-08-24 ENCOUNTER — Telehealth: Payer: Self-pay | Admitting: *Deleted

## 2014-08-24 NOTE — Telephone Encounter (Signed)
Called patient to let them know that I scheduled w/c appointment for August 11 at 8:45.  Patient's wife said that hospice was at their home and will get him a w/c.

## 2014-08-27 ENCOUNTER — Other Ambulatory Visit: Payer: Self-pay | Admitting: Neurology

## 2014-08-31 DIAGNOSIS — J4530 Mild persistent asthma, uncomplicated: Secondary | ICD-10-CM | POA: Diagnosis not present

## 2014-08-31 DIAGNOSIS — G20 Parkinson's disease: Secondary | ICD-10-CM | POA: Diagnosis not present

## 2014-08-31 DIAGNOSIS — I2510 Atherosclerotic heart disease of native coronary artery without angina pectoris: Secondary | ICD-10-CM | POA: Diagnosis not present

## 2014-08-31 DIAGNOSIS — K219 Gastro-esophageal reflux disease without esophagitis: Secondary | ICD-10-CM | POA: Diagnosis not present

## 2014-09-07 ENCOUNTER — Encounter: Payer: Self-pay | Admitting: Family Medicine

## 2014-09-07 ENCOUNTER — Ambulatory Visit (INDEPENDENT_AMBULATORY_CARE_PROVIDER_SITE_OTHER): Payer: Medicare Other | Admitting: Family Medicine

## 2014-09-07 VITALS — BP 108/64 | HR 86 | Temp 98.3°F | Resp 14 | Ht 69.0 in

## 2014-09-07 DIAGNOSIS — I2510 Atherosclerotic heart disease of native coronary artery without angina pectoris: Secondary | ICD-10-CM | POA: Diagnosis not present

## 2014-09-07 DIAGNOSIS — G20 Parkinson's disease: Secondary | ICD-10-CM

## 2014-09-07 DIAGNOSIS — K625 Hemorrhage of anus and rectum: Secondary | ICD-10-CM | POA: Diagnosis not present

## 2014-09-07 DIAGNOSIS — E119 Type 2 diabetes mellitus without complications: Secondary | ICD-10-CM | POA: Diagnosis not present

## 2014-09-07 LAB — COMPLETE METABOLIC PANEL WITH GFR
ALBUMIN: 3.8 g/dL (ref 3.5–5.2)
ALT: 12 U/L (ref 0–53)
AST: 15 U/L (ref 0–37)
Alkaline Phosphatase: 57 U/L (ref 39–117)
BILIRUBIN TOTAL: 0.6 mg/dL (ref 0.2–1.2)
BUN: 14 mg/dL (ref 6–23)
CO2: 24 mEq/L (ref 19–32)
CREATININE: 0.92 mg/dL (ref 0.50–1.35)
Calcium: 9 mg/dL (ref 8.4–10.5)
Chloride: 103 mEq/L (ref 96–112)
GFR, Est Non African American: 85 mL/min
GLUCOSE: 146 mg/dL — AB (ref 70–99)
Potassium: 3.5 mEq/L (ref 3.5–5.3)
SODIUM: 138 meq/L (ref 135–145)
Total Protein: 6.1 g/dL (ref 6.0–8.3)

## 2014-09-07 LAB — CBC WITH DIFFERENTIAL/PLATELET
BASOS ABS: 0 10*3/uL (ref 0.0–0.1)
BASOS PCT: 0 % (ref 0–1)
EOS ABS: 0.1 10*3/uL (ref 0.0–0.7)
EOS PCT: 2 % (ref 0–5)
HCT: 43.7 % (ref 39.0–52.0)
Hemoglobin: 14.9 g/dL (ref 13.0–17.0)
LYMPHS PCT: 17 % (ref 12–46)
Lymphs Abs: 1 10*3/uL (ref 0.7–4.0)
MCH: 32.4 pg (ref 26.0–34.0)
MCHC: 34.1 g/dL (ref 30.0–36.0)
MCV: 95 fL (ref 78.0–100.0)
MONOS PCT: 8 % (ref 3–12)
MPV: 9.9 fL (ref 8.6–12.4)
Monocytes Absolute: 0.5 10*3/uL (ref 0.1–1.0)
NEUTROS PCT: 73 % (ref 43–77)
Neutro Abs: 4.4 10*3/uL (ref 1.7–7.7)
Platelets: 278 10*3/uL (ref 150–400)
RBC: 4.6 MIL/uL (ref 4.22–5.81)
RDW: 13.3 % (ref 11.5–15.5)
WBC: 6 10*3/uL (ref 4.0–10.5)

## 2014-09-07 LAB — HEMOGLOBIN A1C
Hgb A1c MFr Bld: 5.8 % — ABNORMAL HIGH (ref <5.7)
Mean Plasma Glucose: 120 mg/dL — ABNORMAL HIGH (ref <117)

## 2014-09-07 MED ORDER — HYDROCORTISONE ACETATE 25 MG RE SUPP: 25.0000 mg | Freq: Two times a day (BID) | RECTAL | Status: DC

## 2014-09-07 NOTE — Progress Notes (Signed)
Subjective:    Patient ID: Jeremy Terry, male    DOB: 11/29/1945, 69 y.o.   MRN: [id number]  HPI  Patient has severe end-stage Parkinson's disease. He is currently confined to wheelchair. He cannot stand due to his poor balance and his frequent falls area and his neurologist is recommended a referral to hospice. He is rapidly progressed regarding his Parkinson's disease and there is no further treatment that is available to him. Today he is barely able to talk. He frequently forgets what he is saying. He is confined to a wheelchair. 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 He also has a history of coronary artery disease. 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 However he is now an extremely high fall risk. I had a long discussion with his wife regarding the risk of bleeding due to the Aggrenox from falling versus the risk of future strokes. At the present time he would like to continue the Aggrenox but I would have a low threshold to decrease Aggrenox back to aspirin if the patient begins to experience more frequent falling or he is at risk for intracranial hemorrhage from falling.  He is also on glipizide for diabetes. Given his progressive neurodegenerative disease I'm concerned he may not be eating well and therefore I'm fearful that he is at high risk for hypoglycemia. I would like to check a hemoglobin A1c today and based on the results likely switch the patient to something that would be safer. Patient is off his statin medication. Given his history of coronary artery disease as well as the stroke he experienced in his eye, I believe he would benefit from resuming his statin medication. Past Medical History  Diagnosis Date  . Coronary artery disease   . Hypertension   . Diabetes mellitus   . Hypercholesteremia   . BPH (benign prostatic hypertrophy)   . DJD (degenerative joint disease)   .  Asthma   . Memory loss   . Ventral hernia   . GERD (gastroesophageal reflux disease)   . Allergic rhinitis   . Parkinsonian syndrome   . Hypogonadism male   . Neuropathy of leg     Bilateral  . Depression   . Parkinson disease   . NAION (non-arteritic anterior ischemic optic neuropathy), right eye     Followed by Dr. Katy Fitch  . Stroke    Past Surgical History  Procedure Laterality Date  . Coronary stent placement  2003  . Shoulder surgery  2011    rotator cuff   Current Outpatient Prescriptions on File Prior to Visit  Medication Sig Dispense Refill  . albuterol (PROVENTIL HFA;VENTOLIN HFA) 108 (90 BASE) MCG/ACT inhaler Inhale 2 puffs into the lungs every 6 (six) hours as needed for wheezing or shortness of breath. 1 Inhaler 0  . buPROPion (WELLBUTRIN XL) 300 MG 24 hr tablet Take 1 tablet (300 mg total) by mouth daily. 30 tablet 11  . Carbidopa-Levodopa ER (RYTARY) 23.75-95 MG CPCR Take 4 tablets by mouth 3 (three) times daily. Take 4 tablets in am, 4 tablets in the afternoon, 3 tablets in the evening 330 capsule 5  . dipyridamole-aspirin (AGGRENOX) 200-25 MG per 12 hr capsule TAKE 1 CAPSULE BY MOUTH 2 (TWO) TIMES DAILY. 60 capsule 5  . donepezil (ARICEPT) 10 MG tablet Take 1 tablet (10 mg total) by mouth at bedtime. 90 tablet 3  .  Furosemide (LASIX PO) Take 10 mg by mouth as needed.     Marland Kitchen glipiZIDE (GLUCOTROL) 5 MG tablet Take 1 tablet (5 mg total) by mouth 2 (two) times daily before a meal. 180 tablet 0  . losartan (COZAAR) 100 MG tablet TAKE 1 TABLET BY MOUTH EVERY DAY 30 tablet 11  . mirtazapine (REMERON) 15 MG tablet Take 1 tablet (15 mg total) by mouth at bedtime. 30 tablet 5  . Multiple Vitamin (MULTIVITAMIN) capsule Take 1 capsule by mouth daily.      . nitroGLYCERIN (NITROSTAT) 0.4 MG SL tablet Place 1 tablet (0.4 mg total) under the tongue every 5 (five) minutes as needed. 25 tablet 11  . ondansetron (ZOFRAN ODT) 4 MG disintegrating tablet Take 1 tablet (4 mg total) by mouth  every 8 (eight) hours as needed for nausea or vomiting. 20 tablet 0  . oxybutynin (DITROPAN) 5 MG tablet Take 2.5 mg by mouth 3 (three) times daily.     Marland Kitchen oxyCODONE-acetaminophen (PERCOCET/ROXICET) 5-325 MG per tablet Take 1 tablet by mouth every 6 (six) hours as needed for severe pain. 20 tablet 0  . torsemide (DEMADEX) 20 MG tablet Take 1 tablet (20 mg total) by mouth 2 (two) times daily. 60 tablet 5  . cyclobenzaprine (FLEXERIL) 5 MG tablet Take 1 tablet (5 mg total) by mouth 2 (two) times daily as needed for muscle spasms. (Patient not taking: Reported on 09/07/2014) 15 tablet 0  . [DISCONTINUED] rOPINIRole (REQUIP) 1 MG tablet Take 1 mg by mouth 3 (three) times daily.      No current facility-administered medications on file prior to visit.   Allergies  Allergen Reactions  . Morphine Hives    hyperventilate  . Hydrocodone Rash    hyperventilate  . Sulfa Antibiotics Rash    hyperventilate   History   Social History  . Marital Status: Married    Spouse Name: N/A  . Number of Children: 2  . Years of Education: 14   Occupational History  . retired     Psychologist, sport and exercise   Social History Main Topics  . Smoking status: Former Smoker    Quit date: 02/26/1998  . Smokeless tobacco: Never Used  . Alcohol Use: 0.0 oz/week    0 Standard drinks or equivalent per week     Comment: Occ  . Drug Use: No  . Sexual Activity:    Partners: Female   Other Topics Concern  . Not on file   Social History Narrative   Pt lives at home with his spouse.   Caffeine Use: Does not consume     Review of Systems  All other systems reviewed and are negative.      Objective:   Physical Exam  Constitutional: No distress.  Cardiovascular: Normal rate, regular rhythm, normal heart sounds and intact distal pulses.   No murmur heard. Pulmonary/Chest: Effort normal and breath sounds normal. No respiratory distress. He has no wheezes. He has no rales.  Abdominal: Soft. Bowel sounds are normal. He exhibits  no distension and no mass. There is no tenderness. There is no rebound and no guarding.  Musculoskeletal: He exhibits no edema.  Skin: He is not diaphoretic.  Vitals reviewed.         Assessment & Plan:  Rectal bleeding - Plan: hydrocortisone (ANUSOL-HC) 25 MG suppository  Diabetes mellitus type II, controlled - Plan: COMPLETE METABOLIC PANEL WITH GFR, Hemoglobin A1c, CBC with Differential/Platelet  Parkinson disease  ASCVD (arteriosclerotic cardiovascular disease)  Given the frequent falls,  the patient is certainly at risk for bleeding due to Aggrenox however at the present time we will continue the Aggrenox is a believe the benefit outweighs the risk. We can certainly revisit this at any time if his falling increases or he begins to show signs that he may be at risk for intracranial hemorrhage due to his falling. His blood pressure is acceptable. I will check a hemoglobin A1c and if acceptable discontinue his glipizide and try switching the patient to something safer such as Januvia. I would like the patient to resume his statin medication given his history of ASCVD.

## 2014-10-07 ENCOUNTER — Encounter (HOSPITAL_COMMUNITY): Payer: Self-pay | Admitting: Emergency Medicine

## 2014-10-07 ENCOUNTER — Ambulatory Visit: Payer: Medicare Other | Admitting: Physical Therapy

## 2014-10-07 ENCOUNTER — Ambulatory Visit: Payer: PRIVATE HEALTH INSURANCE | Admitting: Occupational Therapy

## 2014-10-07 ENCOUNTER — Emergency Department (HOSPITAL_COMMUNITY)
Admission: EM | Admit: 2014-10-07 | Discharge: 2014-10-07 | Disposition: A | Discharge status: Home / Self Care | Payer: Medicare Other | Attending: Emergency Medicine | Admitting: Emergency Medicine

## 2014-10-07 ENCOUNTER — Ambulatory Visit: Payer: PRIVATE HEALTH INSURANCE

## 2014-10-07 ENCOUNTER — Emergency Department (HOSPITAL_COMMUNITY): Payer: Medicare Other

## 2014-10-07 ENCOUNTER — Ambulatory Visit: Payer: PRIVATE HEALTH INSURANCE | Admitting: Physical Therapy

## 2014-10-07 DIAGNOSIS — G20 Parkinson's disease: Secondary | ICD-10-CM | POA: Diagnosis not present

## 2014-10-07 DIAGNOSIS — Z87891 Personal history of nicotine dependence: Secondary | ICD-10-CM | POA: Diagnosis not present

## 2014-10-07 DIAGNOSIS — F329 Major depressive disorder, single episode, unspecified: Secondary | ICD-10-CM | POA: Insufficient documentation

## 2014-10-07 DIAGNOSIS — Z8719 Personal history of other diseases of the digestive system: Secondary | ICD-10-CM | POA: Insufficient documentation

## 2014-10-07 DIAGNOSIS — W01198A Fall on same level from slipping, tripping and stumbling with subsequent striking against other object, initial encounter: Secondary | ICD-10-CM | POA: Insufficient documentation

## 2014-10-07 DIAGNOSIS — E119 Type 2 diabetes mellitus without complications: Secondary | ICD-10-CM | POA: Diagnosis not present

## 2014-10-07 DIAGNOSIS — Z79899 Other long term (current) drug therapy: Secondary | ICD-10-CM | POA: Diagnosis not present

## 2014-10-07 DIAGNOSIS — Y9289 Other specified places as the place of occurrence of the external cause: Secondary | ICD-10-CM | POA: Insufficient documentation

## 2014-10-07 DIAGNOSIS — J45909 Unspecified asthma, uncomplicated: Secondary | ICD-10-CM | POA: Insufficient documentation

## 2014-10-07 DIAGNOSIS — Z8673 Personal history of transient ischemic attack (TIA), and cerebral infarction without residual deficits: Secondary | ICD-10-CM | POA: Diagnosis not present

## 2014-10-07 DIAGNOSIS — S0003XA Contusion of scalp, initial encounter: Secondary | ICD-10-CM

## 2014-10-07 DIAGNOSIS — E780 Pure hypercholesterolemia: Secondary | ICD-10-CM | POA: Insufficient documentation

## 2014-10-07 DIAGNOSIS — Y9389 Activity, other specified: Secondary | ICD-10-CM | POA: Diagnosis not present

## 2014-10-07 DIAGNOSIS — S0990XA Unspecified injury of head, initial encounter: Secondary | ICD-10-CM | POA: Diagnosis present

## 2014-10-07 DIAGNOSIS — I2510 Atherosclerotic heart disease of native coronary artery without angina pectoris: Secondary | ICD-10-CM | POA: Insufficient documentation

## 2014-10-07 DIAGNOSIS — Y998 Other external cause status: Secondary | ICD-10-CM | POA: Insufficient documentation

## 2014-10-07 DIAGNOSIS — Z7952 Long term (current) use of systemic steroids: Secondary | ICD-10-CM | POA: Insufficient documentation

## 2014-10-07 DIAGNOSIS — N400 Enlarged prostate without lower urinary tract symptoms: Secondary | ICD-10-CM | POA: Insufficient documentation

## 2014-10-07 DIAGNOSIS — W19XXXA Unspecified fall, initial encounter: Secondary | ICD-10-CM

## 2014-10-07 DIAGNOSIS — I10 Essential (primary) hypertension: Secondary | ICD-10-CM | POA: Insufficient documentation

## 2014-10-07 NOTE — Discharge Instructions (Signed)
CT scan of head shows no acute brain injury. You can shower. Ice pack. Neosporin ointment. Tylenol

## 2014-10-07 NOTE — ED Notes (Addendum)
PT states he was trying to get out of his wheelchair unassisted today and fell backwards and hit his head on the edge of the wheelchair causes a laceration to back of scalp. Wife states pt is under hospice homehealth care. PT also states on blood thinning medications.

## 2014-10-07 NOTE — ED Provider Notes (Signed)
CSN: [id number]     Arrival date & time 10/07/14  1737 History   First MD Initiated Contact with Patient 10/07/14 1859     Chief Complaint  Patient presents with  . Fall     (Consider location/radiation/quality/duration/timing/severity/associated sxs/prior Treatment) HPI..... Level V caveat secondary to Parkinson's disease. Patient attempted to stand from his wheelchair. He fell backwards striking the top of his head. No loss of consciousness or neurological deficits. He is on no blood thinning medicines. Family reports normal behavior.  Past Medical History  Diagnosis Date  . Coronary artery disease   . Hypertension   . Diabetes mellitus   . Hypercholesteremia   . BPH (benign prostatic hypertrophy)   . DJD (degenerative joint disease)   . Asthma   . Memory loss   . Ventral hernia   . GERD (gastroesophageal reflux disease)   . Allergic rhinitis   . Parkinsonian syndrome   . Hypogonadism male   . Neuropathy of leg     Bilateral  . Depression   . Parkinson disease   . NAION (non-arteritic anterior ischemic optic neuropathy), right eye     Followed by Dr. Katy Fitch  . Stroke    Past Surgical History  Procedure Laterality Date  . Coronary stent placement  2003  . Shoulder surgery  2011    rotator cuff   Family History  Problem Relation Age of Onset  . Other Father     respiratory problems   Social History  Substance Use Topics  . Smoking status: Former Smoker    Quit date: 02/26/1998  . Smokeless tobacco: Never Used  . Alcohol Use: No    Review of Systems  Unable to perform ROS     Allergies  Morphine; Hydrocodone; and Sulfa antibiotics  Home Medications   Prior to Admission medications   Medication Sig Start Date End Date Taking? Authorizing Provider  albuterol (PROVENTIL HFA;VENTOLIN HFA) 108 (90 BASE) MCG/ACT inhaler Inhale 2 puffs into the lungs every 6 (six) hours as needed for wheezing or shortness of breath. 05/01/13   Susy Frizzle, MD  buPROPion  (WELLBUTRIN XL) 300 MG 24 hr tablet Take 1 tablet (300 mg total) by mouth daily. 03/30/14   Eustace Quail Tat, DO  Carbidopa-Levodopa ER (RYTARY) 23.75-95 MG CPCR Take 4 tablets by mouth 3 (three) times daily. Take 4 tablets in am, 4 tablets in the afternoon, 3 tablets in the evening 08/25/13   Wells Guiles S Tat, DO  cyclobenzaprine (FLEXERIL) 5 MG tablet Take 1 tablet (5 mg total) by mouth 2 (two) times daily as needed for muscle spasms. Patient not taking: Reported on 09/07/2014 08/03/14   Orpah Greek, MD  dipyridamole-aspirin (AGGRENOX) 200-25 MG per 12 hr capsule TAKE 1 CAPSULE BY MOUTH 2 (TWO) TIMES DAILY. 07/06/14   Susy Frizzle, MD  donepezil (ARICEPT) 10 MG tablet Take 1 tablet (10 mg total) by mouth at bedtime. 03/30/14   Eustace Quail Tat, DO  Furosemide (LASIX PO) Take 10 mg by mouth as needed.     Historical Provider, MD  glipiZIDE (GLUCOTROL) 5 MG tablet Take 1 tablet (5 mg total) by mouth 2 (two) times daily before a meal. 08/23/14   Susy Frizzle, MD  hydrocortisone (ANUSOL-HC) 25 MG suppository Place 1 suppository (25 mg total) rectally 2 (two) times daily. 09/07/14   Susy Frizzle, MD  losartan (COZAAR) 100 MG tablet TAKE 1 TABLET BY MOUTH EVERY DAY 07/05/14   Susy Frizzle, MD  mirtazapine (REMERON)  15 MG tablet Take 1 tablet (15 mg total) by mouth at bedtime. 08/04/14   Eustace Quail Tat, DO  Multiple Vitamin (MULTIVITAMIN) capsule Take 1 capsule by mouth daily.      Historical Provider, MD  nitroGLYCERIN (NITROSTAT) 0.4 MG SL tablet Place 1 tablet (0.4 mg total) under the tongue every 5 (five) minutes as needed. 08/07/11   Josue Hector, MD  ondansetron (ZOFRAN ODT) 4 MG disintegrating tablet Take 1 tablet (4 mg total) by mouth every 8 (eight) hours as needed for nausea or vomiting. 07/13/14   Eustace Quail Tat, DO  oxybutynin (DITROPAN) 5 MG tablet Take 2.5 mg by mouth 3 (three) times daily.  12/23/13   Historical Provider, MD  oxyCODONE-acetaminophen (PERCOCET/ROXICET) 5-325 MG per tablet  Take 1 tablet by mouth every 6 (six) hours as needed for severe pain. 08/03/14   Orpah Greek, MD  torsemide (DEMADEX) 20 MG tablet Take 1 tablet (20 mg total) by mouth 2 (two) times daily. 02/11/14   Josue Hector, MD   BP 153/72 mmHg  Pulse 70  Temp(Src) 98.4 F (36.9 C) (Oral)  Resp 16  Ht 5\' 8"  (1.727 m)  Wt 213 lb (96.616 kg)  BMI 32.39 kg/m2  SpO2 100% Physical Exam  Constitutional: He is oriented to person, place, and time. He appears well-developed and well-nourished.  HENT:  Head: Normocephalic.  2 cm abrasion/minor laceration on superior aspect of occipital area. Good hemostasis.  Eyes: Conjunctivae and EOM are normal. Pupils are equal, round, and reactive to light.  Neck: Normal range of motion. Neck supple.  Cardiovascular: Normal rate and regular rhythm.   Pulmonary/Chest: Effort normal and breath sounds normal.  Abdominal: Soft. Bowel sounds are normal.  Musculoskeletal: Normal range of motion.  Neurological: He is alert and oriented to person, place, and time.  Skin: Skin is warm and dry.  Psychiatric: He has a normal mood and affect. His behavior is normal.  Nursing note and vitals reviewed.   ED Course  Procedures (including critical care time) Labs Review Labs Reviewed - No data to display  Imaging Review Ct Head Wo Contrast  10/07/2014   CLINICAL DATA:  69 year old male with history of trauma after falling backwards out of his wheelchair earlier today. On blood thinners.  EXAM: CT HEAD WITHOUT CONTRAST  TECHNIQUE: Contiguous axial images were obtained from the base of the skull through the vertex without intravenous contrast.  COMPARISON:  Head CT 03/04/2014.  FINDINGS: Moderate cerebral and mild cerebellar atrophy. Associated with this is some ex vacuo dilatation of the ventricular system. Patchy and confluent areas of decreased attenuation are noted throughout the deep and periventricular white matter of the cerebral hemispheres bilaterally, compatible  with chronic microvascular ischemic disease. No acute displaced skull fractures are identified. No acute intracranial abnormality. Specifically, no evidence of acute post-traumatic intracranial hemorrhage, no definite regions of acute/subacute cerebral ischemia, no focal mass, mass effect, hydrocephalus or abnormal intra or extra-axial fluid collections. The visualized paranasal sinuses and mastoids are well pneumatized.  IMPRESSION: 1. No evidence of significant acute traumatic injury to the skull or brain. 2. Moderate cerebral and mild cerebellar atrophy with chronic microvascular ischemic changes in the cerebral white matter, similar to the prior examination.   Electronically Signed   By: Vinnie Langton M.D.   On: 10/07/2014 18:43   I, Nilsa Macht, personally reviewed and evaluated these images and lab results as part of my medical decision-making.   EKG Interpretation None      MDM  Final diagnoses:  Fall, initial encounter  Scalp contusion, initial encounter    Family reports normal behavior. CT scan shows no evidence of traumatic injury to skull or brain. Wound cleaned. Discussed with patient, his wife, his son.    Nat Christen, MD 10/07/14 2002

## 2014-10-07 NOTE — ED Notes (Signed)
Pt assisted to wheelchair and to car by myself and Rayburn Felt, RN. Patient total lift, non weighting bearing, per wife this is his baseline and they have help at home.

## 2014-10-18 ENCOUNTER — Ambulatory Visit (INDEPENDENT_AMBULATORY_CARE_PROVIDER_SITE_OTHER): Payer: Medicare Other | Admitting: Family Medicine

## 2014-10-18 DIAGNOSIS — Z111 Encounter for screening for respiratory tuberculosis: Secondary | ICD-10-CM | POA: Diagnosis not present

## 2014-10-19 ENCOUNTER — Other Ambulatory Visit: Payer: Self-pay | Admitting: Neurology

## 2014-10-19 NOTE — Telephone Encounter (Signed)
Pt's wife Mardene Celeste called and said he needed a refill for his medication  Rytary called in/Dawn CB# [phone]

## 2014-10-20 NOTE — Telephone Encounter (Signed)
RX sent to pharmacy  

## 2014-10-21 ENCOUNTER — Ambulatory Visit: Payer: Medicare Other | Admitting: Family Medicine

## 2014-10-21 DIAGNOSIS — Z111 Encounter for screening for respiratory tuberculosis: Secondary | ICD-10-CM

## 2014-10-21 LAB — TB SKIN TEST
Induration: 0 mm
TB SKIN TEST: NEGATIVE

## 2014-10-22 ENCOUNTER — Ambulatory Visit: Payer: Medicare Other

## 2014-11-12 ENCOUNTER — Ambulatory Visit: Payer: PRIVATE HEALTH INSURANCE | Admitting: Neurology

## 2014-11-15 ENCOUNTER — Telehealth: Payer: Self-pay | Admitting: Neurology

## 2014-11-15 NOTE — Telephone Encounter (Signed)
Pt's wife Mardene Celeste called and wanted to talk to Dr Tat in regards to his appointment because he is going into a nursing home and wanted to know if keeping the appointment is necessary/Dawn CB# [phone]

## 2014-11-15 NOTE — Telephone Encounter (Signed)
Patient's wife made aware to keep follow up appt.

## 2014-11-18 ENCOUNTER — Ambulatory Visit: Payer: PRIVATE HEALTH INSURANCE | Admitting: Neurology

## 2014-11-25 ENCOUNTER — Ambulatory Visit: Payer: PRIVATE HEALTH INSURANCE

## 2014-11-25 ENCOUNTER — Ambulatory Visit: Payer: PRIVATE HEALTH INSURANCE | Admitting: Physical Therapy

## 2014-11-25 ENCOUNTER — Ambulatory Visit: Payer: PRIVATE HEALTH INSURANCE | Admitting: Occupational Therapy

## 2014-12-07 ENCOUNTER — Ambulatory Visit (INDEPENDENT_AMBULATORY_CARE_PROVIDER_SITE_OTHER): Payer: Medicare Other | Admitting: Neurology

## 2014-12-07 ENCOUNTER — Encounter: Payer: Self-pay | Admitting: Neurology

## 2014-12-07 VITALS — BP 138/58 | HR 81

## 2014-12-07 DIAGNOSIS — I2510 Atherosclerotic heart disease of native coronary artery without angina pectoris: Secondary | ICD-10-CM

## 2014-12-07 DIAGNOSIS — F330 Major depressive disorder, recurrent, mild: Secondary | ICD-10-CM | POA: Diagnosis not present

## 2014-12-07 DIAGNOSIS — G231 Progressive supranuclear ophthalmoplegia [Steele-Richardson-Olszewski]: Secondary | ICD-10-CM | POA: Diagnosis not present

## 2014-12-07 DIAGNOSIS — R066 Hiccough: Secondary | ICD-10-CM

## 2014-12-07 DIAGNOSIS — Z515 Encounter for palliative care: Secondary | ICD-10-CM

## 2014-12-07 DIAGNOSIS — G4733 Obstructive sleep apnea (adult) (pediatric): Secondary | ICD-10-CM

## 2014-12-07 DIAGNOSIS — R1319 Other dysphagia: Secondary | ICD-10-CM

## 2014-12-07 DIAGNOSIS — F458 Other somatoform disorders: Secondary | ICD-10-CM | POA: Diagnosis not present

## 2014-12-07 NOTE — Patient Instructions (Signed)
1. We have scheduled you at [facility] for your modified barium swallow on 12/09/2014 at 11:00 am. Please arrive 15 minutes prior and go to 1st floor radiology. If you need to reschedule for any reason please call [phone]. 2. Discontinue Remeron 3. Discontinue Carbidopa Levodopa 25/100 and go back to Rytary 23.75-95 mg 4 tablets at 7 am, 3 tablets at 12 pm, 4 tablets at 5 pm.  4. Wheelchair evaluation through Physical Therapy at [facility] [place].

## 2014-12-07 NOTE — Progress Notes (Signed)
Jeremy Terry was seen today in the movement disorders clinic for neurologic consultation at the request of [name] [name], MD.  Jeremy Terry was previously seen by Dr. [name].  They're requesting a second opinion regarding possible DBS surgery.  Jeremy Terry has an appointment with [facility] in this regard as well.  The pt began with L hand tremor that began in 07/2009.  Jeremy Terry was started on requip in 06/2010, but after 2 years on the medication, it was tapered off just 2 days ago because of compulsive spending.  When Jeremy Terry was initally placed on the medication Jeremy Terry had some nausea but his wife did think that it was effective. Jeremy Terry has been on levodopa since 09/2011.  This was definitely effective.  Jeremy Terry takes 1 1/2 tablets three to four times per day.  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  His wife notices that the medication may wear off about 30 minutes to one hour before the next dosage, as she will notice a resurgence of tremor.  Jeremy Terry takes medication at 5 AM, 12 PM, often misses the 3 PM dose and then was taken another at 6 PM.  10/03/12:   The Jeremy Terry presents today with his wife who supplements the history.  They report that Jeremy Terry did not get started on Comtan.  His pharmacy told them that it was backordered.  However, when we called the pharmacy and they stated that they had not received the prescription.  The Jeremy Terry did have a fall on July 16 resulting in a wrist fracture.  Jeremy Terry was outside and fell over a bucket when Jeremy Terry tripped. 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  Jeremy Terry is currently taking his carbidopa/levodopa 25/100, 1-1/2 tablets at 8 AM/12 PM/3 M/6 PM.  Jeremy Terry cannot tell when the medication wears off.  His wife states that it definitely does help.  The Jeremy Terry did go to therapy.  It was recommended that Jeremy Terry use a cane.  Jeremy Terry has not been doing that.  His wife recalls that Jeremy Terry has been very impulsive.  Jeremy Terry  is having major personality change.  Jeremy Terry curses a lot and Jeremy Terry never did this previously.  Jeremy Terry has no hallucinations.  Jeremy Terry is having intermittent diplopia.  10/31/12 update:  The pt presents with his wife who supplements the hx.  His Aricept was increased last time to 10 mg daily.  His Requip was d/c.  Is not sure of the levodopa is helping but Jeremy Terry continues to take carbidopa/levodopa 25/100, 1-1/2 tablets 4 times per day.  I did discontinue his Cymbalta last visit as Jeremy Terry was using it for peripheral neuropathy, but had no symptoms of paresthesias.  Jeremy Terry noticed no increase in paresthesias with the discontinuation of Cymbalta.  His mood remains good.  His wife states that his personality has been much better and Jeremy Terry is not cursing like Jeremy Terry was.  Jeremy Terry continues to have some intermittent double vision.  Jeremy Terry is exercising some, although not necessarily faithfully.  11/27/12 update:  This Jeremy Terry is accompanied in the office by his spouse who supplements the history.  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  Jeremy Terry is currently on Aricept 10 mg daily.  Jeremy Terry is on carbidopa/levodopa 25/100, 1-1/2 tablets 4 times per day.  Last visit, I cautiously added Artane, because of his biggest complaint of tremor.  Jeremy Terry reports that tremor is markedly better.  Jeremy Terry did hold his carbidopa/levodopa at my request prior to coming into the office today.  Jeremy Terry really feels no different off of the medication.  His biggest complaint is significant knee pain.  It is preventing him from exercising.  I did review his MRI of the brain that was recently done.  There was ventricular prominence, but I thought that the frontotemporal atrophy was greater than the atrophy posteriorly.  12/29/12 update:  This Jeremy Terry is accompanied in the office by his spouse who supplements the history.  Jeremy Terry is on artane and doing well.  Last visit, Jeremy Terry did not think that the carbidopa/levodopa 25/100 was helpful and so we stopped it.  Jeremy Terry got much  worse after this was stopped (falls, trouble getting out of the bed) and so Jeremy Terry went to the PCP on Friday and it was restarted.    Jeremy Terry is feeling much better.  His mood is better.  There are no hallucinations.  Jeremy Terry does c/o hypophonic speech that is worsening.  02/23/13:  This Jeremy Terry is accompanied in the office by his spouse who supplements the history.  Last visit his carbidopa/levodopa was restarted as Jeremy Terry got worse when Jeremy Terry was off of the medication.  Jeremy Terry takes it at 52 AM/12 PM/3 PM/6 PM.  Jeremy Terry is doing well with this and much better now that Jeremy Terry is back on the medication.  Jeremy Terry is also on aricept 10 mg daily.  Jeremy Terry still has spells where Jeremy Terry has a significant personality change and is mean to his wife in public, but just as quickly it seems to go away.  They just returned from a cruise and Jeremy Terry had a good time.    His only c/o is tremor in the L hand.  Jeremy Terry is supposed to be on artane but they don't know what happened to that.  I called the pharmacy and it was last picked up in [place].  Jeremy Terry was doing better when on it without significant cognitive change. Jeremy Terry is in [place] and doing well with that.  05/25/13 update:  Jeremy Terry is accompanied by his wife who supplements the history.  Pt is on levodopa at 8am/12pm/3pm/6pm.  I restarted his artane as Jeremy Terry really wanted to retry.  I cautioned them about memory but his wife noted deterioration of memory when Jeremy Terry went back on it and she stopped it in Jan.  Jeremy Terry has been more moody and depressed.  Jeremy Terry has had more balance problems.  Jeremy Terry fell on Saturday when Jeremy Terry bent over to pick up the dog food dish.  Jeremy Terry was supposed to go back to OT but Jeremy Terry got a bad sinus infection and then Jeremy Terry just got worse and was unmotivated to go back.  If Jeremy Terry gets up in the middle of the night, Jeremy Terry will sometimes be confused as to how to get the bathroom.  No hallucinations.    07/21/13 update:  Pt presents with wife, who supplements the history.  Is having trouble sleeping at night but is sleeping during the day.  Snores per wife.   Falls asleep watching movie or just when sitting down will doze off.  Still having compulsive eating/buying.  Bought a truck behind wife's back and becoming marial issue.   Having trouble losing weight.  Becoming aggressive with wife at times.  Added lexapro last visit but not helping.   08/11/13 update:  The Jeremy Terry presents today accompanied by his wife who supplements the history.  The Jeremy Terry  has been sleeping better.  The Jeremy Terry reports that Jeremy Terry feels somewhat more confused, but his wife disagrees.  The Jeremy Terry did decrease the levodopa and while that compulsive behaviors are better, his balance is much worse.  Overall, the Jeremy Terry feels that Jeremy Terry is worse on the lower dose of levodopa, even though his wife is happy that the compulsive behaviors are better.  10/13/13 update:  The Jeremy Terry presents today accompanied by his wife who supplements the history.  Pt is on rytary now, 95 mg - 4 in the AM, 4 in the afternoon and 3 in the evening.  Compulsive behaviors seem better.   Wife states that pt is moody and "mean and angry."  Has had more loss of balance; tells me was just put on a new bladder medication about a month ago and it has really helped.  Gait instability started a month ago.    12/29/13 update:  This Jeremy Terry is accompanied in the office by his spouse who supplements the history.  Pt on rytary 95mg  - 4 in the AM, 4 in the afternoon and 3 in the evening.   They don't think that it has been as effective as in the past and it has been very costly.  Pt c/o more difficulty with coordination but thinks that balance is a little better.  States that steps are smaller and drags R leg.  Dragging leg more for 2 months.  Memory has been "on and off."  Wellbutrin helped greatly with mood - wife states that is 75% better.  Asks about DBS surgery.  Last Thursday, pt was watching TV and noted acute onset of vision change (blurry per pt) on the right side.  Jeremy Terry went to bed and it was the same way the next day.  Jeremy Terry had  an appt the next day with a dr as Jeremy Terry was in a research trial for cholesterol and Jeremy Terry told him Jeremy Terry thought had a stroke.  Jeremy Terry f/u with Dr. Dennard Schaumann.  Jeremy Terry saw Dr. Katy Fitch as well and was told that Jeremy Terry had a little stroke behind his retina.  Jeremy Terry has his carotid u/s tomorrow.  Jeremy Terry has an MRI scheduled for thurs night.  Jeremy Terry still cannot see anything in the upper quadrant out of the right eye but can see in the lower quandrant but can see out of the left eye.  Jeremy Terry was on asa and plavix at the time of the event as well as lipitor.  Jeremy Terry was previously on plavix for his cardiac stent.  03/30/14 update:  The Jeremy Terry returns today for follow-up, accompanied by his wife who supplements the history.  Records that were made available to me since last visit were reviewed.  The Jeremy Terry is currently on Rytary 95 mg, 4 in the morning, 3 in afternoon and 4 in the evening.  Jeremy Terry had neuropsych testing done by Dr. Leonides Schanz since last visit.  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  Pt got very depressed after this.  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  This demonstrated 1-39% stenosis bilaterally.  Jeremy Terry had an echocardiogram on 12/30/2013 that demonstrated a normal left ventricular ejection fraction of 60-65%.  I did change his aspirin and Plavix that Jeremy Terry was previously on to Aggrenox.  Jeremy Terry has tolerated this well and has had no more episodes.  Jeremy Terry had an MRI of the brain since  last visit that was negative for acute infarct.  Jeremy Terry did go to the emergency room on 01/03/2015 after Jeremy Terry fell and hit his head.  Jeremy Terry had a CT of the brain then and it was negative.  Just a few days later on 01/07/2015 Jeremy Terry followed up with his cardiologist and was dizzy and was found to be orthostatic. His Lasix was d/c.  Dizziness was much better.   Jeremy Terry had labs that were essentially unremarkable.  Jeremy Terry went to the emergency room again on 01/17/2015 after a fall  the prior evening after reaching to open the bedroom door.  Jeremy Terry hit his elbow this time and x-rays of the elbow were negative.  Jeremy Terry has not been faithfully using the walker in the home.  They are planning on going on some upcoming cruise.  07/13/14 update:  The Jeremy Terry returns today for follow-up, accompanied by his wife who supplements the history.  Records that were made available to me since last visit were reviewed.  The Jeremy Terry is currently on Rytary 95 mg, 4 in the morning, 3 in afternoon and 4 in the evening.  Jeremy Terry has had a significant decline.  Jeremy Terry is falling 2-3 times a day and his wife can no longer pick him up from the floor.  No serious injuries.  Jeremy Terry will fall using the walker and not using it.  Jeremy Terry is dragging the right leg.  Even with the lift chair, it takes 10 min to get out of it.  Jeremy Terry is on Aricept, 10 mg daily for memory change. His thought process has also decreased.   Last visit, I increased his Wellbutrin from 150 mg daily to 300 mg daily for depression.  Mood is actually better.   [place] are going [place] cruise in [place] and will be gone for 7 days. They asked for some medication for nausea for the cruise. Jeremy Terry will have a scooter for the ship.    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    08/23/14 update:  Jeremy Terry is following up today, accompanied by his wife who supplements the history.  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  Last visit, I told him Jeremy Terry needed to discontinue his walking altogether.  I sent him for a wheelchair evaluation at the neuro-rehabilitation center.  They apparently never contacted the Jeremy Terry.  They went on a cruise since last visit and enjoyed it.  Jeremy Terry used a scooter for the cruise and a Environmental manager.  I do see that Jeremy Terry went to the emergency room on 08/03/2014 after a fall.  Jeremy Terry was evaluated for low back pain and released.  Jeremy Terry  remains on Rytary 95 mg, 4 tablets in the morning, 3 in the afternoon and 4 in the evening.  Jeremy Terry is on Aricept 10 mg for memory change.  They c/o worsening memory but on further questioning, his wife states that Jeremy Terry "forgets to pick up his feet when walking."  She is having more and more difficulty taking care of him.  Jeremy Terry no longer has bladder control and has to wear diapers.  She is having more back pain trying to lift him and change his clothing.  Jeremy Terry is tired and overall frustrated, but states that his mood is better than it used to be.  Jeremy Terry is sleeping well at night.  12/07/14 update:  The Jeremy Terry presents today, accompanied by his son who supplements the history.  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  Jeremy Terry is now in a subacute nursing facility, on hospice.  Prior to going into the SNF, Jeremy Terry was on rytary, 95 mg, 4 tablets in the morning, 3 in the afternoon and 4 in the evening. Jeremy Terry is now on carbidopa/levodopa 25/100, 4 po bid and 3 po q hs.  Jeremy Terry has had several falls since our last visit, but has not had any fractures.  Records were reviewed since last visit.  Jeremy Terry went to the emergency room on 10/07/2014 after a fall.  This was when Jeremy Terry was still living at home.  This was part of the reason that the decision was made to move him to an extended care facility, as his wife is having more difficulty taking care of him.  Last visit, I did wean him off of the Remeron but Jeremy Terry returns on 1/2 tablet of that (7.5 mg), and Jeremy Terry remains on Wellbutrin XL 300 mg for depression.  Jeremy Terry is still on Aricept for the cognitive change as well.  Jeremy Terry denies significant difficulty eating.  Jeremy Terry is not drooling.  Jeremy Terry has some difficulty reading.  His son asks me about trying to get a new wheelchair that Jeremy Terry can scoot around in easier.  His son mentions that the Jeremy Terry is having hit cups much more often, although not completely intractable.  The Jeremy Terry does have a history of sleep apnea but has stopped wearing his CPAP.  Jeremy Terry is  sleeping well at night.   PREVIOUS MEDICATIONS: Requip (compulsive spending), Cymbalta  ALLERGIES:   Allergies  Allergen Reactions  . Morphine Hives    hyperventilate  . Hydrocodone Rash    hyperventilate  . Sulfa Antibiotics Rash    hyperventilate    CURRENT MEDICATIONS:  Current Outpatient Prescriptions on File Prior to Visit  Medication Sig Dispense Refill  . albuterol (PROVENTIL HFA;VENTOLIN HFA) 108 (90 BASE) MCG/ACT inhaler Inhale 2 puffs into the lungs every 6 (six) hours as needed for wheezing or shortness of breath. 1 Inhaler 0  . buPROPion (WELLBUTRIN XL) 300 MG 24 hr tablet Take 1 tablet (300 mg total) by mouth daily. 30 tablet 11  . cyclobenzaprine (FLEXERIL) 5 MG tablet Take 1 tablet (5 mg total) by mouth 2 (two) times daily as needed for muscle spasms. (Jeremy Terry not taking: Reported on 09/07/2014) 15 tablet 0  . dipyridamole-aspirin (AGGRENOX) 200-25 MG per 12 hr capsule TAKE 1 CAPSULE BY MOUTH 2 (TWO) TIMES DAILY. 60 capsule 5  . donepezil (ARICEPT) 10 MG tablet Take 1 tablet (10 mg total) by mouth at bedtime. 90 tablet 3  . Furosemide (LASIX PO) Take 10 mg by mouth as needed.     Marland Kitchen glipiZIDE (GLUCOTROL) 5 MG tablet Take 1 tablet (5 mg total) by mouth 2 (two) times daily before a meal. 180 tablet 0  . hydrocortisone (ANUSOL-HC) 25 MG suppository Place 1 suppository (25 mg total) rectally 2 (two) times daily. 12 suppository 0  . losartan (COZAAR) 100 MG tablet TAKE 1 TABLET BY MOUTH EVERY DAY 30 tablet 11  . mirtazapine (REMERON) 15 MG tablet Take 1 tablet (15 mg total) by mouth at bedtime. 30 tablet 5  . Multiple Vitamin (MULTIVITAMIN) capsule Take 1 capsule by mouth daily.      . nitroGLYCERIN (NITROSTAT) 0.4 MG SL tablet Place 1 tablet (0.4 mg total) under the tongue every 5 (five) minutes as needed. 25 tablet 11  .  ondansetron (ZOFRAN ODT) 4 MG disintegrating tablet Take 1 tablet (4 mg total) by mouth every 8 (eight) hours as needed for nausea or vomiting. 20 tablet 0   . oxybutynin (DITROPAN) 5 MG tablet Take 2.5 mg by mouth 3 (three) times daily.     Marland Kitchen oxyCODONE-acetaminophen (PERCOCET/ROXICET) 5-325 MG per tablet Take 1 tablet by mouth every 6 (six) hours as needed for severe pain. 20 tablet 0  . RYTARY 23.75-95 MG CPCR TAKE 4 TABS IN THE AM, 4 TABS IN THE AFTERNOON, AND 3 TABS IN THE EVENING 330 capsule 5  . torsemide (DEMADEX) 20 MG tablet Take 1 tablet (20 mg total) by mouth 2 (two) times daily. 60 tablet 5  . [DISCONTINUED] rOPINIRole (REQUIP) 1 MG tablet Take 1 mg by mouth 3 (three) times daily.      No current facility-administered medications on file prior to visit.    PAST MEDICAL HISTORY:   Past Medical History  Diagnosis Date  . Coronary artery disease   . Hypertension   . Diabetes mellitus   . Hypercholesteremia   . BPH (benign prostatic hypertrophy)   . DJD (degenerative joint disease)   . Asthma   . Memory loss   . Ventral hernia   . GERD (gastroesophageal reflux disease)   . Allergic rhinitis   . Parkinsonian syndrome   . Hypogonadism male   . Neuropathy of leg     Bilateral  . Depression   . Parkinson disease   . NAION (non-arteritic anterior ischemic optic neuropathy), right eye     Followed by Dr. Katy Fitch  . Stroke     PAST SURGICAL HISTORY:   Past Surgical History  Procedure Laterality Date  . Coronary stent placement  2003  . Shoulder surgery  2011    rotator cuff    SOCIAL HISTORY:   Social History   Social History  . Marital Status: Married    Spouse Name: N/A  . Number of Children: 2  . Years of Education: 14   Occupational History  . retired     Psychologist, sport and exercise   Social History Main Topics  . Smoking status: Former Smoker    Quit date: 02/26/1998  . Smokeless tobacco: Never Used  . Alcohol Use: No  . Drug Use: No  . Sexual Activity:    Partners: Female   Other Topics Concern  . Not on file   Social History Narrative   Pt lives at home with his spouse.   Caffeine Use: Does not consume    FAMILY  HISTORY:   Family Status  Relation Status Death Age  . Father Deceased 28    CHF  . Mother Deceased     pneumonia  . Child Alive     2, healthy    ROS:  A complete 10 system review of systems was obtained and was unremarkable apart from what is mentioned above.  PHYSICAL EXAMINATION:    VITALS:   Filed Vitals:   12/07/14 1049  BP: 138/58  Pulse: 81   Wt Readings from Last 3 Encounters:  10/07/14 213 lb (96.616 kg)  08/23/14 215 lb 9 oz (97.779 kg)  08/03/14 235 lb (106.595 kg)    GEN:  The Jeremy Terry appears stated age and is in NAD. HEENT:  Normocephalic, atraumatic.  The mucous membranes are moist. The superficial temporal arteries are without ropiness or tenderness. CV:  RRR Lungs:  CTAB Neck/HEME:  There are no carotid bruits bilaterally.  Neurological examination:  Orientation: Jeremy Terry is  alert and oriented to person, place and month.  A MoCA was performed 08/11/13 and the Jeremy Terry scored 18/30.  A MoCA was performed in 01/2013 and the pt scored 17/30.   Relies on son for hx.  Cranial nerves: There is good facial symmetry.  There is mild left ptosis.  There is facial hypomimia.  Pupils are equal round and reactive to light bilaterally. .Trouble/paralysis with both upgaze and downgaze.  Jeremy Terry really only has gaze to the left as right horizontal gaze is now paralyzed as well.  There are definite square wave jerks.   The speech is fluent but dysarthric and very hypophonic.   Soft palate rises symmetrically and there is no tongue deviation. Hearing is intact to conversational tone. Motor: Not tested today but it is at least antigravity 4 Deep tendon reflexes: Not tested today  Movement examination: Tone: There is normal tone in the bilateral upper extremities.  The tone in the lower extremities is normal.  Abnormal movements: There is a mild to moderate left upper extremity resting tremor. There is no tremor in the legs Coordination:  There is minimal decremation with hand opening and  closing on the left, but finger taps were good bilaterally.  Jeremy Terry has difficulty with heel taps and toe taps bilaterally. Gait and Station: not tested.  In a wheelchair   ASSESSMENT/PLAN:  1.  PSP  -Long discussion with the Jeremy Terry and his son.  His following has never been here before, so we talked about some issues that we have talked about in the past and some new issues.  I completely agree with hospice.  I completely agree with the subacute nursing facility.    Jeremy Terry is already a DO NOT RESUSCITATE.      -Jeremy Terry will continue on the aricept 10 mg daily.    -His medications have been changed significantly since in the nursing facility.  I suspect that hospice perhaps would not pay for Rytary and changed him to levodopa, but the conversion was not correct.  They substituted it at 1:1 and Jeremy Terry is getting most of the medication at bedtime.  I am actually going to try to change him back to his prior Rytary dose of 95 mg, 4 in the morning, 3 in the afternoon and 4 tablets at dinner.  If that does not work, then I will change him back to the immediate release tablet, but Jeremy Terry did not do as well on this in the past.  I also worry about more blood pressure fluctuations with the immediate release tablet, in addition to the fact that Jeremy Terry remains on blood pressure medication.  His diastolic blood pressure was already low today.  -Son asked about a different type a wheelchair to help him maneuver around and his son is willing to pay for it.  I will ask [facility] if they can evaluate for this.  A merry walker could be of value as well and I discussed this with them, although it is really best that Jeremy Terry does not walk at all.  -I am going to order a modified barium swallow.  I discussed with them that this is not because this will necessarily change his risk for aspiration, but rather it is because I think that dietary change would be important if needed to avoid choking risk. 2.  New onset hiccups  -Has not been intractable.  I  am really trying to avoid more medication.  Jeremy Terry and his son agreed for now. 3.    Peripheral  neuropathy, likely secondary to diabetes.  -Jeremy Terry is off of Cymbalta and doing well. 4.  Depression  -doing well wellbutrin xl 300 mg daily.  -Discontinue mirtazapine 5.  EDS with [facility]  -Understand morbidity and mortality associated with untreated sleep apnea, but no longer wishes to wear CPAP 6.  Anterior ischemic optic neuropathy  -His carotid and echo were unremarkable.  Contributes to vision issues along with lack of vertical mobility of eyes.   7.  follow-up with me in the next 4-6 months, sooner should new neurologic issues arise.   Much greater than 50% of this visit was spent in counseling and coordinating care with the Jeremy Terry and the family.  Total face to face time:  40 min

## 2014-12-08 ENCOUNTER — Other Ambulatory Visit (HOSPITAL_COMMUNITY): Payer: Self-pay | Admitting: Neurology

## 2014-12-08 DIAGNOSIS — R1310 Dysphagia, unspecified: Secondary | ICD-10-CM

## 2014-12-09 ENCOUNTER — Ambulatory Visit (HOSPITAL_COMMUNITY): Admission: RE | Admit: 2014-12-09 | Payer: Medicare Other | Source: Ambulatory Visit

## 2014-12-09 ENCOUNTER — Ambulatory Visit (HOSPITAL_COMMUNITY): Payer: Medicare Other

## 2014-12-10 ENCOUNTER — Ambulatory Visit (HOSPITAL_COMMUNITY)
Admission: RE | Admit: 2014-12-10 | Discharge: 2014-12-10 | Disposition: A | Discharge status: Home / Self Care | Payer: Medicare Other | Source: Ambulatory Visit | Attending: Neurology | Admitting: Neurology

## 2014-12-10 ENCOUNTER — Ambulatory Visit (HOSPITAL_COMMUNITY): Payer: PRIVATE HEALTH INSURANCE

## 2014-12-10 DIAGNOSIS — R1312 Dysphagia, oropharyngeal phase: Secondary | ICD-10-CM | POA: Insufficient documentation

## 2014-12-10 DIAGNOSIS — E7800 Pure hypercholesterolemia, unspecified: Secondary | ICD-10-CM | POA: Insufficient documentation

## 2014-12-10 DIAGNOSIS — I10 Essential (primary) hypertension: Secondary | ICD-10-CM | POA: Insufficient documentation

## 2014-12-10 DIAGNOSIS — I2510 Atherosclerotic heart disease of native coronary artery without angina pectoris: Secondary | ICD-10-CM | POA: Insufficient documentation

## 2014-12-10 DIAGNOSIS — E119 Type 2 diabetes mellitus without complications: Secondary | ICD-10-CM | POA: Diagnosis not present

## 2014-12-10 DIAGNOSIS — R1310 Dysphagia, unspecified: Secondary | ICD-10-CM | POA: Diagnosis present

## 2014-12-10 DIAGNOSIS — G20 Parkinson's disease: Secondary | ICD-10-CM | POA: Insufficient documentation

## 2014-12-10 DIAGNOSIS — R1319 Other dysphagia: Secondary | ICD-10-CM

## 2014-12-13 ENCOUNTER — Other Ambulatory Visit: Payer: Self-pay | Admitting: Family Medicine

## 2015-02-07 ENCOUNTER — Inpatient Hospital Stay: Payer: Medicare Other | Admitting: Anesthesiology

## 2015-02-07 ENCOUNTER — Encounter: Payer: Self-pay | Admitting: Emergency Medicine

## 2015-02-07 ENCOUNTER — Emergency Department: Payer: Medicare Other

## 2015-02-07 ENCOUNTER — Inpatient Hospital Stay
Admission: EM | Admit: 2015-02-07 | Discharge: 2015-02-08 | DRG: 357 | Disposition: A | Discharge status: Skilled Nursing Facility | Payer: Medicare Other | Attending: Internal Medicine | Admitting: Internal Medicine

## 2015-02-07 ENCOUNTER — Encounter
Admission: EM | Disposition: A | Discharge status: Skilled Nursing Facility | Payer: Self-pay | Source: Home / Self Care | Attending: Internal Medicine

## 2015-02-07 DIAGNOSIS — Z87891 Personal history of nicotine dependence: Secondary | ICD-10-CM | POA: Diagnosis not present

## 2015-02-07 DIAGNOSIS — Z66 Do not resuscitate: Secondary | ICD-10-CM | POA: Diagnosis present

## 2015-02-07 DIAGNOSIS — R1110 Vomiting, unspecified: Secondary | ICD-10-CM | POA: Diagnosis present

## 2015-02-07 DIAGNOSIS — G231 Progressive supranuclear ophthalmoplegia [Steele-Richardson-Olszewski]: Secondary | ICD-10-CM | POA: Diagnosis present

## 2015-02-07 DIAGNOSIS — I2510 Atherosclerotic heart disease of native coronary artery without angina pectoris: Secondary | ICD-10-CM | POA: Diagnosis present

## 2015-02-07 DIAGNOSIS — K250 Acute gastric ulcer with hemorrhage: Secondary | ICD-10-CM

## 2015-02-07 DIAGNOSIS — R0902 Hypoxemia: Secondary | ICD-10-CM

## 2015-02-07 DIAGNOSIS — Z886 Allergy status to analgesic agent status: Secondary | ICD-10-CM

## 2015-02-07 DIAGNOSIS — M1990 Unspecified osteoarthritis, unspecified site: Secondary | ICD-10-CM | POA: Diagnosis present

## 2015-02-07 DIAGNOSIS — R000 Tachycardia, unspecified: Secondary | ICD-10-CM

## 2015-02-07 DIAGNOSIS — E119 Type 2 diabetes mellitus without complications: Secondary | ICD-10-CM | POA: Diagnosis present

## 2015-02-07 DIAGNOSIS — H5440 Blindness, one eye, unspecified eye: Secondary | ICD-10-CM | POA: Diagnosis present

## 2015-02-07 DIAGNOSIS — Z955 Presence of coronary angioplasty implant and graft: Secondary | ICD-10-CM

## 2015-02-07 DIAGNOSIS — K920 Hematemesis: Secondary | ICD-10-CM

## 2015-02-07 DIAGNOSIS — K2100 Gastro-esophageal reflux disease with esophagitis, without bleeding: Secondary | ICD-10-CM | POA: Insufficient documentation

## 2015-02-07 DIAGNOSIS — K922 Gastrointestinal hemorrhage, unspecified: Secondary | ICD-10-CM | POA: Diagnosis present

## 2015-02-07 DIAGNOSIS — Z515 Encounter for palliative care: Secondary | ICD-10-CM | POA: Diagnosis present

## 2015-02-07 DIAGNOSIS — Z7982 Long term (current) use of aspirin: Secondary | ICD-10-CM | POA: Diagnosis not present

## 2015-02-07 DIAGNOSIS — I10 Essential (primary) hypertension: Secondary | ICD-10-CM | POA: Diagnosis present

## 2015-02-07 DIAGNOSIS — R0602 Shortness of breath: Secondary | ICD-10-CM | POA: Diagnosis present

## 2015-02-07 DIAGNOSIS — K254 Chronic or unspecified gastric ulcer with hemorrhage: Secondary | ICD-10-CM | POA: Insufficient documentation

## 2015-02-07 DIAGNOSIS — G20 Parkinson's disease: Secondary | ICD-10-CM | POA: Diagnosis present

## 2015-02-07 HISTORY — PX: ESOPHAGOGASTRODUODENOSCOPY (EGD) WITH PROPOFOL: SHX5813

## 2015-02-07 LAB — CBC
HCT: 42.6 % (ref 40.0–52.0)
HEMOGLOBIN: 14.5 g/dL (ref 13.0–18.0)
MCH: 32.4 pg (ref 26.0–34.0)
MCHC: 34.2 g/dL (ref 32.0–36.0)
MCV: 94.8 fL (ref 80.0–100.0)
Platelets: 337 10*3/uL (ref 150–440)
RBC: 4.49 MIL/uL (ref 4.40–5.90)
RDW: 13.5 % (ref 11.5–14.5)
WBC: 31.5 10*3/uL — ABNORMAL HIGH (ref 3.8–10.6)

## 2015-02-07 LAB — COMPREHENSIVE METABOLIC PANEL
ALK PHOS: 61 U/L (ref 38–126)
ALT: 18 U/L (ref 17–63)
AST: 17 U/L (ref 15–41)
Albumin: 4 g/dL (ref 3.5–5.0)
Anion gap: 11 (ref 5–15)
BUN: 33 mg/dL — AB (ref 6–20)
CALCIUM: 8.9 mg/dL (ref 8.9–10.3)
CO2: 26 mmol/L (ref 22–32)
CREATININE: 1.03 mg/dL (ref 0.61–1.24)
Chloride: 99 mmol/L — ABNORMAL LOW (ref 101–111)
Glucose, Bld: 219 mg/dL — ABNORMAL HIGH (ref 65–99)
Potassium: 3.6 mmol/L (ref 3.5–5.1)
SODIUM: 136 mmol/L (ref 135–145)
Total Bilirubin: 1.7 mg/dL — ABNORMAL HIGH (ref 0.3–1.2)
Total Protein: 6.8 g/dL (ref 6.5–8.1)

## 2015-02-07 LAB — HEMOGLOBIN
HEMOGLOBIN: 13.2 g/dL (ref 13.0–18.0)
HEMOGLOBIN: 10.2 g/dL — AB (ref 13.0–18.0)

## 2015-02-07 LAB — LACTIC ACID, PLASMA: LACTIC ACID, VENOUS: 2 mmol/L (ref 0.5–2.0)

## 2015-02-07 LAB — TROPONIN I: Troponin I: 0.03 ng/mL (ref <0.031)

## 2015-02-07 LAB — GLUCOSE, CAPILLARY: GLUCOSE-CAPILLARY: 112 mg/dL — AB (ref 65–99)

## 2015-02-07 LAB — TYPE AND SCREEN
ABO/RH(D): A POS
Antibody Screen: NEGATIVE

## 2015-02-07 LAB — MRSA PCR SCREENING: MRSA BY PCR: NEGATIVE

## 2015-02-07 LAB — ABO/RH: ABO/RH(D): A POS

## 2015-02-07 SURGERY — ESOPHAGOGASTRODUODENOSCOPY (EGD) WITH PROPOFOL
Anesthesia: General

## 2015-02-07 MED ORDER — ALBUTEROL SULFATE (2.5 MG/3ML) 0.083% IN NEBU
5.0000 mg | INHALATION_SOLUTION | Freq: Once | RESPIRATORY_TRACT | Status: DC
  Filled 2015-02-07: qty 6

## 2015-02-07 MED ORDER — SODIUM CHLORIDE 0.9 % IV SOLN
8.0000 mg/h | INTRAVENOUS | Status: DC
  Administered 2015-02-07 – 2015-02-08 (×3): 8 mg/h via INTRAVENOUS
  Filled 2015-02-07 (×3): qty 80

## 2015-02-07 MED ORDER — ONDANSETRON HCL 4 MG/2ML IJ SOLN: 4.0000 mg | Freq: Four times a day (QID) | INTRAMUSCULAR | Status: DC | PRN

## 2015-02-07 MED ORDER — ACETAMINOPHEN 325 MG PO TABS: 650.0000 mg | ORAL_TABLET | Freq: Four times a day (QID) | ORAL | Status: DC | PRN

## 2015-02-07 MED ORDER — SODIUM CHLORIDE 0.9 % IV BOLUS (SEPSIS)
1000.0000 mL | Freq: Once | INTRAVENOUS | Status: AC
  Administered 2015-02-07: 1000 mL via INTRAVENOUS

## 2015-02-07 MED ORDER — SODIUM CHLORIDE 0.9 % IV SOLN
INTRAVENOUS | Status: DC
  Administered 2015-02-07 – 2015-02-08 (×3): via INTRAVENOUS

## 2015-02-07 MED ORDER — IPRATROPIUM-ALBUTEROL 0.5-2.5 (3) MG/3ML IN SOLN
3.0000 mL | Freq: Once | RESPIRATORY_TRACT | Status: AC
  Administered 2015-02-07: 3 mL via RESPIRATORY_TRACT
  Filled 2015-02-07: qty 3

## 2015-02-07 MED ORDER — ONDANSETRON HCL 4 MG PO TABS: 4.0000 mg | ORAL_TABLET | Freq: Four times a day (QID) | ORAL | Status: DC | PRN

## 2015-02-07 MED ORDER — PROPOFOL 10 MG/ML IV BOLUS
INTRAVENOUS | Status: DC | PRN
  Administered 2015-02-07: 80 mg via INTRAVENOUS
  Administered 2015-02-07: 30 mg via INTRAVENOUS

## 2015-02-07 MED ORDER — SODIUM CHLORIDE 0.9 % IV SOLN
80.0000 mg | Freq: Once | INTRAVENOUS | Status: AC
  Administered 2015-02-07: 80 mg via INTRAVENOUS
  Filled 2015-02-07: qty 80

## 2015-02-07 MED ORDER — MORPHINE SULFATE (PF) 2 MG/ML IV SOLN: 1.0000 mg | INTRAVENOUS | Status: DC | PRN

## 2015-02-07 MED ORDER — PANTOPRAZOLE SODIUM 40 MG IV SOLR: 40.0000 mg | Freq: Once | INTRAVENOUS | Status: DC

## 2015-02-07 MED ORDER — PHENYLEPHRINE HCL 10 MG/ML IJ SOLN
INTRAMUSCULAR | Status: DC | PRN
  Administered 2015-02-07: 100 ug via INTRAVENOUS

## 2015-02-07 MED ORDER — ACETAMINOPHEN 650 MG RE SUPP: 650.0000 mg | Freq: Four times a day (QID) | RECTAL | Status: DC | PRN

## 2015-02-07 NOTE — Anesthesia Preprocedure Evaluation (Signed)
Anesthesia Evaluation  Patient identified by MRN, date of birth, ID band Patient awake    Reviewed: Allergy & Precautions, H&P , NPO status , Patient's Chart, lab work & pertinent test results, reviewed documented beta blocker date and time   History of Anesthesia Complications Negative for: history of anesthetic complications  Airway Mallampati: III  TM Distance: >3 FB Neck ROM: full    Dental no notable dental hx. 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)    GI/Hepatic Neg liver ROS, GERD  Medicated and Controlled,  Endo/Other  diabetes (diet controlled)  Renal/GU negative Renal ROS  negative genitourinary   Musculoskeletal   Abdominal   Peds  Hematology negative hematology ROS (+)   Anesthesia Other Findings Past Medical History:   Coronary artery disease                                      Hypertension                                                 Diabetes mellitus                                            Hypercholesteremia                                           BPH (benign prostatic hypertrophy)                           DJD (degenerative joint disease)                             Asthma                                                       Memory loss                                                  Ventral hernia  GERD (gastroesophageal reflux disease)                       Allergic rhinitis                                            Parkinsonian syndrome (HCC)                                  Hypogonadism male                                            Neuropathy of leg                                              Comment:Bilateral   Depression                                                   Parkinson disease (HCC)                                      NAION (non-arteritic anterior ischemic optic n*                Comment:Followed by Dr. Katy Fitch   Stroke [facility])                                                 Reproductive/Obstetrics negative OB ROS                             Anesthesia Physical Anesthesia Plan  ASA: III  Anesthesia Plan: General   Post-op Pain Management:    Induction:   Airway Management Planned:   Additional Equipment:   Intra-op Plan:   Post-operative Plan:   Informed Consent: I have reviewed the patients History and Physical, chart, labs and discussed the procedure including the risks, benefits and alternatives for the proposed anesthesia with the patient or authorized representative who has indicated his/her understanding and acceptance.   Dental Advisory Given  Plan Discussed with: Anesthesiologist, CRNA and Surgeon  Anesthesia Plan Comments:         Anesthesia Quick Evaluation

## 2015-02-07 NOTE — Op Note (Signed)
[facility] Patient Name: Jeremy Terry Procedure Date: 02/07/2015 4:22 PM MRN: [id number] Account #: [id number] Date of Birth: 07/28/1945 Admit Type: Inpatient Age: 69 Room: [facility] ENDO ROOM 3 Gender: Male Note Status: Finalized Procedure:         Upper GI endoscopy Indications:       Coffee-ground emesis, Hematemesis Providers:         Lucilla Lame, MD Referring MD:      Cammie Mcgee. Dennard Schaumann, MD (Referring MD) Medicines:         Propofol per Anesthesia Complications:     No immediate complications. Procedure:         Pre-Anesthesia Assessment:                    - Prior to the procedure, a History and Physical was                     performed, and patient medications and allergies were                     reviewed. The patient's tolerance of previous anesthesia                     was also reviewed. The risks and benefits of the procedure                     and the sedation options and risks were discussed with the                     patient. All questions were answered, and informed consent                     was obtained. Prior Anticoagulants: The patient has taken                     no previous anticoagulant or antiplatelet agents. ASA                     Grade Assessment: II - A patient with mild systemic                     disease. After reviewing the risks and benefits, the                     patient was deemed in satisfactory condition to undergo                     the procedure.                    After obtaining informed consent, the endoscope was passed                     under direct vision. Throughout the procedure, the                     patient's blood pressure, pulse, and oxygen saturations                     were monitored continuously. The Endoscope was introduced                     through the mouth, and advanced to the second part of  duodenum. The upper GI endoscopy was accomplished without           difficulty. The patient tolerated the procedure well. Findings:      LA Grade D (one or more mucosal breaks involving at least 75% of       esophageal circumference) esophagitis with no bleeding was found in the       entire esophagus.      One non-bleeding cratered gastric ulcer with a visible vessel was found       in the gastric body. For hemostasis, two hemostatic clips were       successfully placed (MRI compatible). There was no bleeding at the end       of the procedure.      The examined duodenum was normal. Impression:        - LA Grade D reflux esophagitis.                    - Gastric ulcer containing visible vessel. MRI-compatible                     clips were placed.                    - Normal examined duodenum.                    - No specimens collected. Recommendation:    - Use a proton pump inhibitor PO daily. Procedure Code(s): --- Professional ---                    [phone], Esophagogastroduodenoscopy, flexible, transoral;                     with control of bleeding, any method Diagnosis Code(s): --- Professional ---                    K92.0, Hematemesis                    K21.0, Gastro-esophageal reflux disease with esophagitis                    K25.4, Chronic or unspecified gastric ulcer with hemorrhage CPT copyright 2014 American Medical Association. All rights reserved. The codes documented in this report are preliminary and upon coder review may  be revised to meet current compliance requirements. Lucilla Lame, MD 02/07/2015 4:41:30 PM This report has been signed electronically. Number of Addenda: 0 Note Initiated On: 02/07/2015 4:22 PM      [facility]

## 2015-02-07 NOTE — ED Notes (Signed)
Per EMS pt was found in coffee ground emesis and "out of it"  According to them he is normally alert and oriented and this is not his baseline. Per EMS he is at [facility] with a diagnosis of Parkinson's.  He presents to the ER tonight with eyes closed and not answering questions.  Both of his hands are fisted and drawn toward abdominal midline.

## 2015-02-07 NOTE — Anesthesia Postprocedure Evaluation (Signed)
Anesthesia Post Note  Patient: SAVA GEHMAN  Procedure(s) Performed: Procedure(s) (LRB): ESOPHAGOGASTRODUODENOSCOPY (EGD) WITH PROPOFOL (N/A)  Patient location during evaluation: PACU Anesthesia Type: General Level of consciousness: awake and alert Pain management: pain level controlled Vital Signs Assessment: post-procedure vital signs reviewed and stable Respiratory status: spontaneous breathing, nonlabored ventilation, respiratory function stable and patient connected to nasal cannula oxygen Cardiovascular status: blood pressure returned to baseline and stable Postop Assessment: no signs of nausea or vomiting Anesthetic complications: no    Last Vitals:  Filed Vitals:   02/07/15 1813 02/07/15 1814  BP: 111/68   Pulse:  92  Temp: 36.8 C   Resp: 20     Last Pain: There were no vitals filed for this visit.               Martha Clan

## 2015-02-07 NOTE — ED Provider Notes (Signed)
[facility] Emergency Department Provider Note  ____________________________________________  Time seen: Approximately [id number] AM  I have reviewed the triage vital signs and the nursing notes.   HISTORY  Chief Complaint Shortness of Breath and Hematemesis  Patient with depressed mental status  HPI Jeremy Terry is a 69 y.o. male with a history of parkinson's and on hospice care who comes into the hospital today with vomiting coffee ground emesis. The patient is here from his nursing home. The patient is currently not in any pain but he started this vomiting at 3 AM. Per EMS the patient's oxygen saturation was 80% on room air but it did increase to 92% on 4 L.The patient's heart rate was 158 prior to him coming to the hospital. According to EMS the patient typically is able to speak for himself but he has not spoken much since they have been with him. He was sent in for concern for GI bleed.   Past Medical History  Diagnosis Date  . Coronary artery disease   . Hypertension   . Diabetes mellitus   . Hypercholesteremia   . BPH (benign prostatic hypertrophy)   . DJD (degenerative joint disease)   . Asthma   . Memory loss   . Ventral hernia   . GERD (gastroesophageal reflux disease)   . Allergic rhinitis   . Parkinsonian syndrome ([place])   . Hypogonadism male   . Neuropathy of leg     Bilateral  . Depression   . Parkinson disease ([place])   . NAION (non-arteritic anterior ischemic optic neuropathy), right eye     Followed by Dr. Katy Fitch  . Stroke [facility])     Patient Active Problem List   Diagnosis Date Noted  . Orthostatic hypotension 03/08/2014  . Edema 02/11/2014  . NAION (non-arteritic anterior ischemic optic neuropathy), right eye   . Diabetic neuropathy ([place]) 08/08/2012  . Parkinson's disease ([place]) 11/13/2011  . ESSENTIAL HYPERTENSION, BENIGN 05/08/2010  . HYPERCHOLESTEROLEMIA 04/03/2010  . Essential hypertension 04/03/2010  . Coronary atherosclerosis  04/03/2010  . ASTHMA, CHILDHOOD 04/03/2010  . GERD 04/03/2010  . VENTRAL HERNIA 04/03/2010  . HIATAL HERNIA 04/03/2010  . BPH (benign prostatic hypertrophy) 04/03/2010  . DEGENERATIVE JOINT DISEASE 04/03/2010  . MEMORY LOSS 04/03/2010  . Diabetes mellitus with neurological manifestation ([place]) 04/03/2010    Past Surgical History  Procedure Laterality Date  . Coronary stent placement  2003  . Shoulder surgery  2011    rotator cuff    Current Outpatient Rx  Name  Route  Sig  Dispense  Refill  . albuterol (PROVENTIL HFA;VENTOLIN HFA) 108 (90 BASE) MCG/ACT inhaler   Inhalation   Inhale 2 puffs into the lungs every 6 (six) hours as needed for wheezing or shortness of breath.   1 Inhaler   0   . aspirin 81 MG chewable tablet   Oral   Chew 81 mg by mouth daily.         Marland Kitchen buPROPion (WELLBUTRIN) 100 MG tablet   Oral   Take 100 mg by mouth 3 (three) times daily.         Marland Kitchen donepezil (ARICEPT) 10 MG tablet   Oral   Take 1 tablet (10 mg total) by mouth at bedtime.   90 tablet   3   . glipiZIDE (GLUCOTROL) 5 MG tablet      TAKE 1 TABLET BY MOUTH TWICE A DAY BEFORE MEALS (NOT COVERED BY HOSPICE)   180 tablet   0   .  losartan (COZAAR) 100 MG tablet      TAKE 1 TABLET BY MOUTH EVERY DAY   30 tablet   11   . Multiple Vitamin (MULTIVITAMIN) capsule   Oral   Take 1 capsule by mouth daily.           . nitroGLYCERIN (NITROSTAT) 0.4 MG SL tablet   Sublingual   Place 1 tablet (0.4 mg total) under the tongue every 5 (five) minutes as needed.   25 tablet   11   . ondansetron (ZOFRAN ODT) 4 MG disintegrating tablet   Oral   Take 1 tablet (4 mg total) by mouth every 8 (eight) hours as needed for nausea or vomiting.   20 tablet   0   . oxyCODONE-acetaminophen (PERCOCET/ROXICET) 5-325 MG per tablet   Oral   Take 1 tablet by mouth every 6 (six) hours as needed for severe pain.   20 tablet   0   . potassium chloride (K-DUR,KLOR-CON) 10 MEQ tablet   Oral   Take 10 mEq  by mouth 2 (two) times daily.         Marland Kitchen RYTARY 23.75-95 MG CPCR      TAKE 4 TABS IN THE AM, 4 TABS IN THE AFTERNOON, AND 3 TABS IN THE EVENING   330 capsule   5   . senna (SENOKOT) 8.6 MG TABS tablet   Oral   Take 1 tablet by mouth daily.         Marland Kitchen torsemide (DEMADEX) 20 MG tablet   Oral   Take 1 tablet (20 mg total) by mouth 2 (two) times daily.   60 tablet   5   . buPROPion (WELLBUTRIN XL) 300 MG 24 hr tablet   Oral   Take 1 tablet (300 mg total) by mouth daily.   30 tablet   11   . mirtazapine (REMERON) 15 MG tablet   Oral   Take 1 tablet (15 mg total) by mouth at bedtime. Patient taking differently: Take 7.5 mg by mouth at bedtime.    30 tablet   5     Allergies Morphine; Hydrocodone; and Sulfa antibiotics  Family History  Problem Relation Age of Onset  . Other Father     respiratory problems    Social History Social History  Substance Use Topics  . Smoking status: Former Smoker    Quit date: 02/26/1998  . Smokeless tobacco: Never Used  . Alcohol Use: No    Review of Systems Unable to assess as patient minimally responsive and not answering questions  ____________________________________________   PHYSICAL EXAM:  VITAL SIGNS: ED Triage Vitals  Enc Vitals Group     BP 02/07/15 0500 128/78 mmHg     Pulse Rate 02/07/15 0446 134     Resp 02/07/15 0500 35     Temp 02/07/15 0446 98.9 F (37.2 C)     Temp Source 02/07/15 0446 Oral     SpO2 02/07/15 0446 88 %     Weight --      Height --      Head Cir --      Peak Flow --      Pain Score --      Pain Loc --      Pain Edu? --      Excl. in [place]? --     Constitutional: Somnolent but responsive . Ill appearing and in moderate distress. Eyes: Conjunctivae are normal. PERRL. EOMI. Head: Atraumatic. Nose: No congestion/rhinnorhea. Mouth/Throat: Mucous membranes  are moist.  Oropharynx non-erythematous. Cardiovascular: Tachycardia, regular rhythm. Grossly normal heart sounds.  Good peripheral  circulation. Respiratory: Normal respiratory effort.  No retractions. Lungs CTAB. Gastrointestinal: Soft and nontender. No distention. Positive Bowel sounds Rectal: Heme negative stool control positive Musculoskeletal: No lower extremity tenderness nor edema.   Neurologic:  Normal speech and language.  Skin:  Skin is warm, dry and intact.  Psychiatric: Mood and affect are normal.   ____________________________________________   LABS (all labs ordered are listed, but only abnormal results are displayed)  Labs Reviewed  CBC - Abnormal; Notable for the following:    WBC 31.5 (*)    All other components within normal limits  COMPREHENSIVE METABOLIC PANEL - Abnormal; Notable for the following:    Chloride 99 (*)    Glucose, Bld 219 (*)    BUN 33 (*)    Total Bilirubin 1.7 (*)    All other components within normal limits  TROPONIN I  LACTIC ACID, PLASMA  TYPE AND SCREEN  ABO/RH   ____________________________________________  EKG  ED ECG REPORT I, Loney Hering, the attending physician, personally viewed and interpreted this ECG.   Date: 02/07/2015  EKG Time: 825  Rate: 116  Rhythm: sinus tachycardia  Axis: Right axis deviation  Intervals:none  ST&T Change: None  ____________________________________________  RADIOLOGY  Chest x-ray: No acute cardiopulmonary process ____________________________________________   PROCEDURES  Procedure(s) performed: None  Critical Care performed: Yes, see critical care note(s)  CRITICAL CARE Performed by: Charlesetta Ivory P   Total critical care time: 30 minutes  Critical care time was exclusive of separately billable procedures and treating other patients.  Critical care was necessary to treat or prevent imminent or life-threatening deterioration.  Critical care was time spent personally by me on the following activities: development of treatment plan with patient and/or surrogate as well as nursing, discussions with  consultants, evaluation of patient's response to treatment, examination of patient, obtaining history from patient or surrogate, ordering and performing treatments and interventions, ordering and review of laboratory studies, ordering and review of radiographic studies, pulse oximetry and re-evaluation of patient's condition.  ____________________________________________   INITIAL IMPRESSION / ASSESSMENT AND PLAN / ED COURSE  Pertinent labs & imaging results that were available during my care of the patient were reviewed by me and considered in my medical decision making (see chart for details).  The patient is a 1-year-old male who comes in with coffee ground emesis. We placed an NG tube to help alleviate the patient from the emesis which did help his breathing. The patient did receive 2 L of normal saline and became more arousable. His heart rate improved to the low 100s from 150s. I will place the patient on Protonix drip and admit him to the hospitalist. The patient does not have heme positive stool but his GI bleed may not have traveled as far to make his stool heme positive. After placing an NG tube the patient was doing well and his breathing had improved. He also seemed to be more alert. The patient was able to in transition from a nonrebreather to nasal cannula. He will be admitted to the hospital for further evaluation. ____________________________________________   FINAL CLINICAL IMPRESSION(S) / ED DIAGNOSES  Final diagnoses:  Coffee ground emesis  Tachycardia  Hypoxia      Loney Hering, MD 02/07/15 [phone]

## 2015-02-07 NOTE — ED Notes (Signed)
Pts non-re breather removed. Pt placed on nasal cannula at 4L. O2 stat at 100.

## 2015-02-07 NOTE — H&P (Signed)
[facility] [place] NAME: Jeremy Terry    MR#:  [id number]  DATE OF BIRTH:  01-20-1946  DATE OF ADMISSION:  02/07/2015  PRIMARY CARE PHYSICIAN: Odette Fraction, MD   REQUESTING/REFERRING PHYSICIAN: Dr. Dahlia Client  CHIEF COMPLAINT:   coffee-ground emesis History is obtained from patient's wife ER physician and staff. Patient has Parkinson's disease and does not talk much. HISTORY OF PRESENT ILLNESS:  Jeremy Terry  is a 69 y.o. male with a known history of progressive supranuclear palsy, Parkinson's disease, CAD status post stent in the past , DJD, diabetes, hypertension comes [facility] was under hospice services there after he was found to have coffee-ground emesis started around 3:00 in the morning. Patient has a G-tube placed and about 100 cc of dark maroon output has been obtained. He denies any chest pain shortness of breath or abdominal pain. Presented with tachycardia heart rate in the 80s sats dropped down into the 80s Jeremy Terry presently he is on 3 L nasal cannula sats are 97% not in any respiratory distress. Patient currently is getting IV protonic strip and IV fluids. His hemoglobin is stable. He is being admitted for possible GI bleed.  PAST MEDICAL HISTORY:   Past Medical History  Diagnosis Date  . Coronary artery disease   . Hypertension   . Diabetes mellitus   . Hypercholesteremia   . BPH (benign prostatic hypertrophy)   . DJD (degenerative joint disease)   . Asthma   . Memory loss   . Ventral hernia   . GERD (gastroesophageal reflux disease)   . Allergic rhinitis   . Parkinsonian syndrome ([place])   . Hypogonadism male   . Neuropathy of leg     Bilateral  . Depression   . Parkinson disease ([place])   . NAION (non-arteritic anterior ischemic optic neuropathy), right eye     Followed by Dr. Katy Fitch  . Stroke [facility])     PAST SURGICAL HISTOIRY:   Past Surgical History  Procedure Laterality Date  . Coronary  stent placement  2003  . Shoulder surgery  2011    rotator cuff    SOCIAL HISTORY:   Social History  Substance Use Topics  . Smoking status: Former Smoker    Quit date: 02/26/1998  . Smokeless tobacco: Never Used  . Alcohol Use: No    FAMILY HISTORY:   Family History  Problem Relation Age of Onset  . Other Father     respiratory problems    DRUG ALLERGIES:   Allergies  Allergen Reactions  . Morphine Hives    hyperventilate  . Hydrocodone Rash    hyperventilate  . Sulfa Antibiotics Rash    hyperventilate    REVIEW OF SYSTEMS:  Review of Systems  Unable to perform ROS: acuity of condition     MEDICATIONS AT HOME:   Prior to Admission medications   Medication Sig Start Date End Date Taking? Authorizing Provider  albuterol (PROVENTIL HFA;VENTOLIN HFA) 108 (90 BASE) MCG/ACT inhaler Inhale 2 puffs into the lungs every 6 (six) hours as needed for wheezing or shortness of breath. 05/01/13  Yes Susy Frizzle, MD  aspirin 81 MG chewable tablet Chew 81 mg by mouth daily.   Yes Historical Provider, MD  buPROPion (WELLBUTRIN) 100 MG tablet Take 100 mg by mouth 3 (three) times daily.   Yes Historical Provider, MD  donepezil (ARICEPT) 10 MG tablet Take 1 tablet (10 mg total) by mouth at bedtime. 03/30/14  Yes Rebecca S Tat, DO  glipiZIDE (GLUCOTROL) 5 MG tablet TAKE 1 TABLET BY MOUTH TWICE A DAY BEFORE MEALS (NOT COVERED BY HOSPICE) 12/13/14  Yes Susy Frizzle, MD  losartan (COZAAR) 100 MG tablet TAKE 1 TABLET BY MOUTH EVERY DAY 07/05/14  Yes Susy Frizzle, MD  Multiple Vitamin (MULTIVITAMIN) capsule Take 1 capsule by mouth daily.     Yes Historical Provider, MD  nitroGLYCERIN (NITROSTAT) 0.4 MG SL tablet Place 1 tablet (0.4 mg total) under the tongue every 5 (five) minutes as needed. 08/07/11  Yes Josue Hector, MD  ondansetron (ZOFRAN ODT) 4 MG disintegrating tablet Take 1 tablet (4 mg total) by mouth every 8 (eight) hours as needed for nausea or vomiting. 07/13/14  Yes  Eustace Quail Tat, DO  oxyCODONE-acetaminophen (PERCOCET/ROXICET) 5-325 MG per tablet Take 1 tablet by mouth every 6 (six) hours as needed for severe pain. 08/03/14  Yes Orpah Greek, MD  potassium chloride (K-DUR,KLOR-CON) 10 MEQ tablet Take 10 mEq by mouth 2 (two) times daily.   Yes Historical Provider, MD  RYTARY 23.75-95 MG CPCR TAKE 4 TABS IN THE AM, 4 TABS IN THE AFTERNOON, AND 3 TABS IN THE EVENING 10/20/14  Yes Rebecca S Tat, DO  senna (SENOKOT) 8.6 MG TABS tablet Take 1 tablet by mouth daily.   Yes Historical Provider, MD  torsemide (DEMADEX) 20 MG tablet Take 1 tablet (20 mg total) by mouth 2 (two) times daily. 02/11/14  Yes Josue Hector, MD  buPROPion (WELLBUTRIN XL) 300 MG 24 hr tablet Take 1 tablet (300 mg total) by mouth daily. 03/30/14   Eustace Quail Tat, DO  mirtazapine (REMERON) 15 MG tablet Take 1 tablet (15 mg total) by mouth at bedtime. Patient taking differently: Take 7.5 mg by mouth at bedtime.  08/04/14   Eustace Quail Tat, DO      VITAL SIGNS:  Blood pressure 115/80, pulse 114, temperature 98.1 F (36.7 C), temperature source Axillary, resp. rate 100, SpO2 22 %.  PHYSICAL EXAMINATION:  GENERAL:  69 y.o.-year-old patient lying in the bed with no acute distress.  EYES: Pupils equal, round, reactive to light and accommodation. No scleral icterus. Extraocular muscles intact.  HEENT: Head atraumatic, normocephalic. Oropharynx and nasopharynx clear. NG tube NECK:  Supple, no jugular venous distention. No thyroid enlargement, no tenderness.  LUNGS: Normal breath sounds bilaterally, no wheezing, rales,rhonchi or crepitation. No use of accessory muscles of respiration.  CARDIOVASCULAR: S1, S2 normal. Tachycardia No murmurs, rubs, or gallops.  ABDOMEN: Soft, nontender, nondistended. Bowel sounds present. No organomegaly or mass.  EXTREMITIES: No pedal edema, cyanosis, or clubbing.  NEUROLOGIC: No focal neuro deficit. Patient has rest tremors. Unable to assess secondary to patient  being ill with coffee-ground emesis  PSYCHIATRIC: patient is alert   SKIN: No obvious rash, lesion, or ulcer.   LABORATORY PANEL:   CBC  Recent Labs Lab 02/07/15 0503  WBC 31.5*  HGB 14.5  HCT 42.6  PLT 337   ------------------------------------------------------------------------------------------------------------------  Chemistries   Recent Labs Lab 02/07/15 0503  NA 136  K 3.6  CL 99*  CO2 26  GLUCOSE 219*  BUN 33*  CREATININE 1.03  CALCIUM 8.9  AST 17  ALT 18  ALKPHOS 61  BILITOT 1.7*   ------------------------------------------------------------------------------------------------------------------  Cardiac Enzymes  Recent Labs Lab 02/07/15 0503  TROPONINI 0.03   ------------------------------------------------------------------------------------------------------------------  RADIOLOGY:  Dg Chest Portable 1 View  02/07/2015  CLINICAL DATA:  Coffee ground emesis. Hypoxia. History of hypertension diabetes. EXAM: PORTABLE CHEST  1 VIEW COMPARISON:  Chest radiograph April 25, 2013. FINDINGS: Cardiomediastinal silhouette is normal. The lungs are clear without pleural effusions or focal consolidations. Trachea projects midline and there is no pneumothorax. Soft tissue planes and included osseous structures are non-suspicious. Nasogastric tube looped in the distribution of the distal esophagus, distal tip not identified, above the level of the thoracic inlet. IMPRESSION: No acute cardiopulmonary process. Nasogastric tube looped in distal esophageal region, coursing above the level of the thoracic inlet. Electronically Signed   By: Elon Alas M.D.   On: 02/07/2015 05:49    EKG:   Sinus tachycardia. IMPRESSION AND PLAN:   Samer Botbyl  is a 69 y.o. male with a known history of progressive supranuclear palsy, Parkinson's disease, CAD status post stent in the past , DJD, diabetes, hypertension comes from [facility] white [facility] was under hospice services there  after he was found to have coffee-ground emesis started around 3:00 in the morning.  1. Coffee-ground emesis suspect upper GI bleed secondary to gastritis rule out peptic ulcer disease. -Admitted to medical floor -Nothing by mouth -NG tube has been placed in the ER. -IV fluids -IV protonic drip -Hemoglobin every 8 hourly -GI consultation placed -Hold aspirin. Avoid NSAIDS.  2. History of progressive supranuclear palsy and history of Parkinson's disease Since patient is nothing by mouth will hold off all by mouth meds According to family patient has very limited ability to walk he uses wheelchair most of the time I will hold off on PT consult  3. CAD stable Hold off aspirin given GI bleed  4. Hypertension Blood pressure is stable.  hold by mouth meds We'll give hydralazine IV if needed  5. DVT prophylaxis SCDs and teds No antiplatelet agents in the setting of GI bleed  Above was discussed with patient's wife, confirmed DO NOT RESUSCITATE status. She voiced understanding    All the records are reviewed and case discussed with ED provider. Management plans discussed with the patient, family and they are in agreement.  CODE STATUS: DO NOT RESUSCITATE. The patient is out of facility yellow form this was readdressed with Mrs. Hnat will agrees with that TOTAL TIME TAKING CARE OF THIS PATIENT:  58minutes.    Suezette Lafave M.D on 02/07/2015 at 11:10 AM  Between 7am to 6pm - Pager - [phone]  After 6pm go to [email or web address] - password [email or web address] [facility]  [place] [facility]  Office  [phone]  CC: Primary care physician; Odette Fraction, MD

## 2015-02-07 NOTE — Progress Notes (Signed)
PT Cancellation Note  Patient Details Name: Jeremy Terry MRN: [id number] DOB: Sep 03, 1945   Cancelled Treatment:    Reason Eval/Treat Not Completed: Other (Consult received and chart reviewed.  Per physician H&P, PT not indicated at this time as patient largely bedbound, followed by home hospice services.  Discussed with attending physician; declines need for PT at this time, agreeable with therapist completing order.  Will re-consult should needs/goals of care change.)  Deann Mclaine H. [place] Shark, PT, DPT, NCS 02/07/2015, 3:44 PM [phone]

## 2015-02-07 NOTE — Consult Note (Signed)
[facility]  [address], [place], [place] [address] Phone: [phone] Fax : [phone]  Consultation  Referring Provider:     No ref. provider found Primary Care Physician:  Odette Fraction, MD Primary Gastroenterologist:           Reason for Consultation:     Hematemesis  Date of Admission:  02/07/2015 Date of Consultation:  02/07/2015         HPI:   Jeremy Terry is a 69 y.o. male who has a history of parkinson's and is on hospice. The patient was found to have coffee ground emesis and he was brought to the hospital. The patient had some respitory distress that was reported to have resolved once the patient had an NG tube placed. After the NG tube was placed there was coffee ground material through the NG. This cleared up readily. The patient also had heme-negative stools and it was thought that the blood had not had time to transverse the GI tract. Is no report of any abdominal pain. The patient also denies ever having any GI bleeding in the past. The patient is nonverbal for the most part of most of the history is gotten through his wife who is at the bedside. There is no report of the patient being on any anticoagulation medication or any anti-inflammatories.    Past Medical History  Diagnosis Date  . Coronary artery disease   . Hypertension   . Diabetes mellitus   . Hypercholesteremia   . BPH (benign prostatic hypertrophy)   . DJD (degenerative joint disease)   . Asthma   . Memory loss   . Ventral hernia   . GERD (gastroesophageal reflux disease)   . Allergic rhinitis   . Parkinsonian syndrome ([place])   . Hypogonadism male   . Neuropathy of leg     Bilateral  . Depression   . Parkinson disease ([place])   . NAION (non-arteritic anterior ischemic optic neuropathy), right eye     Followed by Dr. Katy Fitch  . Stroke [facility])     Past Surgical History  Procedure Laterality Date  . Coronary stent placement  2003  . Shoulder surgery  2011    rotator  cuff    Prior to Admission medications   Medication Sig Start Date End Date Taking? Authorizing Provider  albuterol (PROVENTIL HFA;VENTOLIN HFA) 108 (90 BASE) MCG/ACT inhaler Inhale 2 puffs into the lungs every 6 (six) hours as needed for wheezing or shortness of breath. 05/01/13  Yes Susy Frizzle, MD  aspirin 81 MG chewable tablet Chew 81 mg by mouth daily.   Yes Historical Provider, MD  buPROPion (WELLBUTRIN) 100 MG tablet Take 100 mg by mouth 3 (three) times daily.   Yes Historical Provider, MD  donepezil (ARICEPT) 10 MG tablet Take 1 tablet (10 mg total) by mouth at bedtime. 03/30/14  Yes Rebecca S Tat, DO  glipiZIDE (GLUCOTROL) 5 MG tablet TAKE 1 TABLET BY MOUTH TWICE A DAY BEFORE MEALS (NOT COVERED BY HOSPICE) 12/13/14  Yes Susy Frizzle, MD  losartan (COZAAR) 100 MG tablet TAKE 1 TABLET BY MOUTH EVERY DAY 07/05/14  Yes Susy Frizzle, MD  Multiple Vitamin (MULTIVITAMIN) capsule Take 1 capsule by mouth daily.     Yes Historical Provider, MD  nitroGLYCERIN (NITROSTAT) 0.4 MG SL tablet Place 1 tablet (0.4 mg total) under the tongue every 5 (five) minutes as needed. 08/07/11  Yes Josue Hector, MD  ondansetron (ZOFRAN ODT) 4 MG disintegrating tablet Take  1 tablet (4 mg total) by mouth every 8 (eight) hours as needed for nausea or vomiting. 07/13/14  Yes Rebecca S Tat, DO  oxyCODONE-acetaminophen (PERCOCET/ROXICET) 5-325 MG per tablet Take 1 tablet by mouth every 6 (six) hours as needed for severe pain. 08/03/14  Yes Orpah Greek, MD  potassium chloride (K-DUR,KLOR-CON) 10 MEQ tablet Take 10 mEq by mouth 2 (two) times daily.   Yes Historical Provider, MD  RYTARY 23.75-95 MG CPCR TAKE 4 TABS IN THE AM, 4 TABS IN THE AFTERNOON, AND 3 TABS IN THE EVENING 10/20/14  Yes Rebecca S Tat, DO  senna (SENOKOT) 8.6 MG TABS tablet Take 1 tablet by mouth daily.   Yes Historical Provider, MD  torsemide (DEMADEX) 20 MG tablet Take 1 tablet (20 mg total) by mouth 2 (two) times daily. 02/11/14  Yes Josue Hector, MD  buPROPion (WELLBUTRIN XL) 300 MG 24 hr tablet Take 1 tablet (300 mg total) by mouth daily. 03/30/14   Eustace Quail Tat, DO  mirtazapine (REMERON) 15 MG tablet Take 1 tablet (15 mg total) by mouth at bedtime. Patient taking differently: Take 7.5 mg by mouth at bedtime.  08/04/14   Eustace Quail Tat, DO    Family History  Problem Relation Age of Onset  . Other Father     respiratory problems     Social History  Substance Use Topics  . Smoking status: Former Smoker    Quit date: 02/26/1998  . Smokeless tobacco: Never Used  . Alcohol Use: No    Allergies as of 02/07/2015 - Review Complete 02/07/2015  Allergen Reaction Noted  . Morphine Hives   . Hydrocodone Rash   . Sulfa antibiotics Rash 10/31/2012    Review of Systems:    All systems reviewed and negative except where noted in HPI.   Physical Exam:  Vital signs in last 24 hours: Temp:  [98.9 F (37.2 C)] 98.9 F (37.2 C) (12/12 0446) Pulse Rate:  [110-156] 110 (12/12 0930) Resp:  [17-35] 25 (12/12 0930) BP: (120-161)/(77-101) 135/85 mmHg (12/12 0930) SpO2:  [88 %-100 %] 98 % (12/12 0930)   General:   Minimally communicative in NAD Head:  Normocephalic and atraumatic. Eyes:   No icterus.   Conjunctiva pink. PERRLA. Ears:  Normal auditory acuity. Neck:  Supple; no masses or thyroidomegaly Lungs: Respirations even and unlabored. Lungs clear to auscultation bilaterally.   No wheezes, crackles, or rhonchi.  Heart:  Regular rate and rhythm;  Without murmur, clicks, rubs or gallops Abdomen:  Soft, nondistended, nontender. Normal bowel sounds. No appreciable masses or hepatomegaly.  No rebound or guarding.  Rectal:  Not performed. Msk:  Symmetrical without gross deformities.    Extremities:  Without edema, cyanosis or clubbing. Neurologic:  Resting tremor Skin:  Intact without significant lesions or rashes. Cervical Nodes:  No significant cervical adenopathy. Psych:  Alert and cooperative. Normal affect.  LAB  RESULTS:  Recent Labs  02/07/15 0503  WBC 31.5*  HGB 14.5  HCT 42.6  PLT 337   BMET  Recent Labs  02/07/15 0503  NA 136  K 3.6  CL 99*  CO2 26  GLUCOSE 219*  BUN 33*  CREATININE 1.03  CALCIUM 8.9   LFT  Recent Labs  02/07/15 0503  PROT 6.8  ALBUMIN 4.0  AST 17  ALT 18  ALKPHOS 61  BILITOT 1.7*   PT/INR No results for input(s): LABPROT, INR in the last 72 hours.  STUDIES: Dg Chest Portable 1 View  02/07/2015  CLINICAL DATA:  Coffee ground emesis. Hypoxia. History of hypertension diabetes. EXAM: PORTABLE CHEST 1 VIEW COMPARISON:  Chest radiograph April 25, 2013. FINDINGS: Cardiomediastinal silhouette is normal. The lungs are clear without pleural effusions or focal consolidations. Trachea projects midline and there is no pneumothorax. Soft tissue planes and included osseous structures are non-suspicious. Nasogastric tube looped in the distribution of the distal esophagus, distal tip not identified, above the level of the thoracic inlet. IMPRESSION: No acute cardiopulmonary process. Nasogastric tube looped in distal esophageal region, coursing above the level of the thoracic inlet. Electronically Signed   By: Elon Alas M.D.   On: 02/07/2015 05:49      Impression / Plan:   Jeremy Terry is a 69 y.o. y/o male with who was admitted with coffee-ground emesis. The patient's hemoglobin was over 14 on admission. The patient has an NG tube without any further drainage of any coffee ground material or signs of blood. The patient will be set up for an upper endoscopy for today. The patient's wife has been explained the plan and she agrees with it.   Thank you for involving me in the care of this patient.      LOS: 0 days   Ollen Bowl, MD  02/07/2015, 11:00 AM   Note: This dictation was prepared with Dragon dictation along with smaller phrase technology. Any transcriptional errors that result from this process are unintentional.

## 2015-02-07 NOTE — Progress Notes (Signed)
Inpatient Diabetes Program Recommendations  AACE/ADA: New Consensus Statement on Inpatient Glycemic Control (2015)  Target Ranges:  Prepandial:   less than 140 mg/dL      Peak postprandial:   less than 180 mg/dL (1-2 hours)      Critically ill patients:  140 - 180 mg/dL  Results for [name], [name] (MRN [id number]) as of 02/07/2015 11:03  Ref. Range 02/07/2015 05:03  Glucose Latest Ref Range: 65-99 mg/dL 219 (H)   Review of Glycemic Control  Diabetes history: No Outpatient Diabetes medications: NA Current orders for Inpatient glycemic control: None  Inpatient Diabetes Program Recommendations: Correction (SSI): Noted lab glucose of 219 mg/dl at 5:03 am today. Question if hyperglycemia is stress response related. While inpatient, may want to order CBGs and Novolog sensitive correction if needed.  Thanks, Barnie Alderman, RN, MSN, CDE Diabetes Coordinator Inpatient Diabetes Program [phone] (Team Pager from [place] to [place]) [phone] ([facility] office) [phone] [facility] office) [phone] [facility] office)

## 2015-02-07 NOTE — Transfer of Care (Signed)
Immediate Anesthesia Transfer of Care Note  Patient: Jeremy Terry  Procedure(s) Performed: Procedure(s): ESOPHAGOGASTRODUODENOSCOPY (EGD) WITH PROPOFOL (N/A)  Patient Location: PACU and Endoscopy Unit  Anesthesia Type:General  Level of Consciousness: patient cooperative and lethargic  Airway & Oxygen Therapy: Patient Spontanous Breathing and Patient connected to nasal cannula oxygen  Post-op Assessment: Report given to RN and Post -op Vital signs reviewed and stable  Post vital signs: Reviewed and stable  Last Vitals:  Filed Vitals:   02/07/15 1551 02/07/15 1646  BP: 115/70 92/69  Pulse: 101   Temp: 37.8 C 36.8 C  Resp: 16 17    Complications: No apparent anesthesia complications

## 2015-02-08 LAB — BASIC METABOLIC PANEL
ANION GAP: 4 — AB (ref 5–15)
BUN: 16 mg/dL (ref 6–20)
CO2: 24 mmol/L (ref 22–32)
Calcium: 8.3 mg/dL — ABNORMAL LOW (ref 8.9–10.3)
Chloride: 115 mmol/L — ABNORMAL HIGH (ref 101–111)
Creatinine, Ser: 0.79 mg/dL (ref 0.61–1.24)
GFR calc Af Amer: >60 mL/min (ref >60)
GLUCOSE: 138 mg/dL — AB (ref 65–99)
POTASSIUM: 3.5 mmol/L (ref 3.5–5.1)
SODIUM: 143 mmol/L (ref 135–145)

## 2015-02-08 LAB — CBC
HEMATOCRIT: 33.7 % — AB (ref 40.0–52.0)
HEMOGLOBIN: 11.6 g/dL — AB (ref 13.0–18.0)
MCH: 32.9 pg (ref 26.0–34.0)
MCHC: 34.4 g/dL (ref 32.0–36.0)
MCV: 95.6 fL (ref 80.0–100.0)
Platelets: 218 10*3/uL (ref 150–440)
RBC: 3.52 MIL/uL — ABNORMAL LOW (ref 4.40–5.90)
RDW: 13.9 % (ref 11.5–14.5)
WBC: 15.5 10*3/uL — AB (ref 3.8–10.6)

## 2015-02-08 LAB — HEMOGLOBIN: HEMOGLOBIN: 11.9 g/dL — AB (ref 13.0–18.0)

## 2015-02-08 MED ORDER — PANTOPRAZOLE SODIUM 40 MG PO TBEC: 40.0000 mg | DELAYED_RELEASE_TABLET | Freq: Two times a day (BID) | ORAL | Status: AC

## 2015-02-08 MED ORDER — PANTOPRAZOLE SODIUM 40 MG PO TBEC
40.0000 mg | DELAYED_RELEASE_TABLET | Freq: Two times a day (BID) | ORAL | Status: DC
  Administered 2015-02-08: 40 mg via ORAL
  Filled 2015-02-08: qty 1

## 2015-02-08 NOTE — Clinical Social Work Note (Signed)
Clinical Social Work Assessment  Patient Details  Name: Jeremy Terry MRN: [id number] Date of Birth: 1945-05-03  Date of referral:  02/08/15               Reason for consult:  Facility Placement                Permission sought to share information with:  Family Supports Permission granted to share information::  Yes, Verbal Permission Granted  Name::     wife, [name]  Housing/Transportation Living arrangements for the past 2 months:  [place] of Information:  Patient, Spouse Patient Interpreter Needed:  None Criminal Activity/Legal Involvement Pertinent to Current Situation/Hospitalization:  No - Comment as needed Significant Relationships:  Adult Children, Spouse Lives with:  Facility Resident Do you feel safe going back to the place where you live?  Yes Need for family participation in patient care:  Yes (Comment)  Care giving concerns:  No care giving concerns identified   Facilities manager / plan:  CSW consulted as pt was admitted from [facility]. Pt is resident as respite with Hospice, however will likely be LTC, per facility. Pt is able to return at discharge. Pt is ready for discharge today.   CSW met with pt and wife to address discharge. Pt and wife are agreeable to discharge plan. CSW sent discharge information to facility, and they are ready to accept pt. RN to call report and EMS to provide transportation.   CSW is signing off as no further needs identified.   Employment status:  Retired Forensic scientist:  Medicare, Other (Comment Required) (Hospice ) PT Recommendations:  Not assessed at this time Information / Referral to community resources:  Other (Comment Required) (SNF and Hospice)  Patient/Family's Response to care:  Pt and wife were appreciative of CSW support.   Patient/Family's Understanding of and Emotional Response to Diagnosis, Current Treatment, and Prognosis:  Pt and wife understand that pt needs a higher level  of care.   Emotional Assessment Appearance:  Appears older than stated age Attitude/Demeanor/Rapport:  Other (Appropriate) Affect (typically observed):  Flat Orientation:  Oriented to Self Alcohol / Substance use:  Never Used Psych involvement (Current and /or in the community):  No (Comment)  Discharge Needs  Concerns to be addressed:  No discharge needs identified Readmission within the last 30 days:  No Current discharge risk:  Chronically ill Barriers to Discharge:  No Barriers Identified   Darden Dates, LCSW 02/08/2015, 4:56 PM

## 2015-02-08 NOTE — Discharge Summary (Addendum)
[facility] NAME: Jeremy Terry    MR#:  [id number]  DATE OF BIRTH:  1945-03-08  DATE OF ADMISSION:  02/07/2015 ADMITTING PHYSICIAN: Fritzi Mandes, MD  DATE OF DISCHARGE: 02/08/15  PRIMARY CARE PHYSICIAN: Jenna Luo [name], MD    ADMISSION DIAGNOSIS:  Tachycardia [R00.0] Hypoxia [R09.02] Coffee ground emesis [K92.0]  DISCHARGE DIAGNOSIS:  Upper GI bleed due to Gastric ulcer s/p clipping  SECONDARY DIAGNOSIS:   Past Medical History  Diagnosis Date  . Coronary artery disease   . Hypertension   . Diabetes mellitus   . Hypercholesteremia   . BPH (benign prostatic hypertrophy)   . DJD (degenerative joint disease)   . Asthma   . Memory loss   . Ventral hernia   . GERD (gastroesophageal reflux disease)   . Allergic rhinitis   . Parkinsonian syndrome ([place])   . Hypogonadism male   . Neuropathy of leg     Bilateral  . Depression   . Parkinson disease ([place])   . NAION (non-arteritic anterior ischemic optic neuropathy), right eye     Followed by Dr. Katy Fitch  . Stroke [facility])     HOSPITAL COURSE:  Jeremy Terry is a 69 y.o. male with a known history of progressive supranuclear palsy, Parkinson's disease, CAD status post stent in the past , DJD, diabetes, hypertension comes from [facility] was under hospice services there after he was found to have coffee-ground emesis started around 3:00 in the morning.  1. Coffee-ground emesis suspect upper GI bleed secondary to gastritis rule out peptic ulcer disease. -pt was on IV protonix drip -Hemoglobin every 8 hourly which remains stable. No indication for transfusion at present -GI consult noted underwent EGD showed- LA Grade D reflux esophagitis - Gastric ulcer containing visible vessel. MRI-compatible clips were placed. -Hold aspirin. Avoid NSAIDS. -PPI bid -soft diet  2. History of progressive supranuclear palsy and history of Parkinson's disease Pt is bed boud 3. CAD  stable Hold off aspirin given GI bleed  4. Hypertension Blood pressure is stable. -resume home meds  5. DVT prophylaxis SCDs and teds No antiplatelet agents in the setting of GI bleed  Hemodynamically stable. D/c back to [facility] Left msg for son on the phone CONSULTS OBTAINED:  Treatment Team:  Lucilla Lame, MD  DRUG ALLERGIES:   Allergies  Allergen Reactions  . Morphine Hives    hyperventilate  . Hydrocodone Rash    hyperventilate  . Sulfa Antibiotics Rash    hyperventilate    DISCHARGE MEDICATIONS:   Current Discharge Medication List    START taking these medications   Details  pantoprazole (PROTONIX) 40 MG tablet Take 1 tablet (40 mg total) by mouth 2 (two) times daily. Qty: 60 tablet, Refills: 2      CONTINUE these medications which have NOT CHANGED   Details  albuterol (PROVENTIL HFA;VENTOLIN HFA) 108 (90 BASE) MCG/ACT inhaler Inhale 2 puffs into the lungs every 6 (six) hours as needed for wheezing or shortness of breath. Qty: 1 Inhaler, Refills: 0   Associated Diagnoses: Acute rhinosinusitis    aspirin 81 MG chewable tablet Chew 81 mg by mouth daily.    buPROPion (WELLBUTRIN) 100 MG tablet Take 100 mg by mouth 3 (three) times daily.    donepezil (ARICEPT) 10 MG tablet Take 1 tablet (10 mg total) by mouth at bedtime. Qty: 90 tablet, Refills: 3    glipiZIDE (GLUCOTROL) 5 MG tablet TAKE 1 TABLET BY MOUTH TWICE A  DAY BEFORE MEALS (NOT COVERED BY HOSPICE) Qty: 180 tablet, Refills: 0    losartan (COZAAR) 100 MG tablet TAKE 1 TABLET BY MOUTH EVERY DAY Qty: 30 tablet, Refills: 11    Multiple Vitamin (MULTIVITAMIN) capsule Take 1 capsule by mouth daily.      nitroGLYCERIN (NITROSTAT) 0.4 MG SL tablet Place 1 tablet (0.4 mg total) under the tongue every 5 (five) minutes as needed. Qty: 25 tablet, Refills: 11    ondansetron (ZOFRAN ODT) 4 MG disintegrating tablet Take 1 tablet (4 mg total) by mouth every 8 (eight) hours as needed for nausea or vomiting. Qty: 20  tablet, Refills: 0    oxyCODONE-acetaminophen (PERCOCET/ROXICET) 5-325 MG per tablet Take 1 tablet by mouth every 6 (six) hours as needed for severe pain. Qty: 20 tablet, Refills: 0    potassium chloride (K-DUR,KLOR-CON) 10 MEQ tablet Take 10 mEq by mouth 2 (two) times daily.    RYTARY 23.75-95 MG CPCR TAKE 4 TABS IN THE AM, 4 TABS IN THE AFTERNOON, AND 3 TABS IN THE EVENING Qty: 330 capsule, Refills: 5    senna (SENOKOT) 8.6 MG TABS tablet Take 1 tablet by mouth daily.    torsemide (DEMADEX) 20 MG tablet Take 1 tablet (20 mg total) by mouth 2 (two) times daily. Qty: 60 tablet, Refills: 5    mirtazapine (REMERON) 15 MG tablet Take 1 tablet (15 mg total) by mouth at bedtime. Qty: 30 tablet, Refills: 5      STOP taking these medications     buPROPion (WELLBUTRIN XL) 300 MG 24 hr tablet         If you experience worsening of your admission symptoms, develop shortness of breath, life threatening emergency, suicidal or homicidal thoughts you must seek medical attention immediately by calling 911 or calling your MD immediately  if symptoms less severe.  You Must read complete instructions/literature along with all the possible adverse reactions/side effects for all the Medicines you take and that have been prescribed to you. Take any new Medicines after you have completely understood and accept all the possible adverse reactions/side effects.   Please note  You were cared for by a hospitalist during your hospital stay. If you have any questions about your discharge medications or the care you received while you were in the hospital after you are discharged, you can call the unit and asked to speak with the hospitalist on call if the hospitalist that took care of you is not available. Once you are discharged, your primary care physician will handle any further medical issues. Please note that NO REFILLS for any discharge medications will be authorized once you are discharged, as it is  imperative that you return to your primary care physician (or establish a relationship with a primary care physician if you do not have one) for your aftercare needs so that they can reassess your need for medications and monitor your lab values. Today   SUBJECTIVE   Does not verbalize much  VITAL SIGNS:  Blood pressure 131/68, pulse 103, temperature 98.7 F (37.1 C), temperature source Oral, resp. rate 18, height 5\' 8"  (1.727 m), weight 181 lb 1.6 oz (82.146 kg), SpO2 95 %.  I/O:    Intake/Output Summary (Last 24 hours) at 02/08/15 1225 Last data filed at 02/08/15 1000  Gross per 24 hour  Intake    600 ml  Output      0 ml  Net    600 ml    PHYSICAL EXAMINATION:  GENERAL:  69 y.o.-year-old patient lying in the bed with no acute distress.  EYES: Pupils equal, round, reactive to light and accommodation. No scleral icterus. Extraocular muscles intact.  HEENT: Head atraumatic, normocephalic. Oropharynx and nasopharynx clear.  NECK:  Supple, no jugular venous distention. No thyroid enlargement, no tenderness.  LUNGS: Normal breath sounds bilaterally, no wheezing, rales,rhonchi or crepitation. No use of accessory muscles of respiration.  CARDIOVASCULAR: S1, S2 normal. No murmurs, rubs, or gallops.  ABDOMEN: Soft, non-tender, non-distended. Bowel sounds present. No organomegaly or mass.  EXTREMITIES: No pedal edema, cyanosis, or clubbing. Heel protectors NEUROLOGIC: unable to assess. Grossly intact PSYCHIATRIC: The patient is alert SKIN: No obvious rash, lesion, or ulcer.   DATA REVIEW:   CBC   Recent Labs Lab 02/08/15 0506  WBC 15.5*  HGB 11.6*  HCT 33.7*  PLT 218    Chemistries   Recent Labs Lab 02/07/15 0503 02/08/15 0506  NA 136 143  K 3.6 3.5  CL 99* 115*  CO2 26 24  GLUCOSE 219* 138*  BUN 33* 16  CREATININE 1.03 0.79  CALCIUM 8.9 8.3*  AST 17  --   ALT 18  --   ALKPHOS 61  --   BILITOT 1.7*  --     Microbiology Results   Recent Results (from the  past 240 hour(s))  MRSA PCR Screening     Status: None   Collection Time: 02/07/15  8:17 PM  Result Value Ref Range Status   MRSA by PCR NEGATIVE NEGATIVE Final    Comment:        The GeneXpert MRSA Assay (FDA approved for NASAL specimens only), is one component of a comprehensive MRSA colonization surveillance program. It is not intended to diagnose MRSA infection nor to guide or monitor treatment for MRSA infections.     RADIOLOGY:  Dg Chest Portable 1 View  02/07/2015  CLINICAL DATA:  Coffee ground emesis. Hypoxia. History of hypertension diabetes. EXAM: PORTABLE CHEST 1 VIEW COMPARISON:  Chest radiograph April 25, 2013. FINDINGS: Cardiomediastinal silhouette is normal. The lungs are clear without pleural effusions or focal consolidations. Trachea projects midline and there is no pneumothorax. Soft tissue planes and included osseous structures are non-suspicious. Nasogastric tube looped in the distribution of the distal esophagus, distal tip not identified, above the level of the thoracic inlet. IMPRESSION: No acute cardiopulmonary process. Nasogastric tube looped in distal esophageal region, coursing above the level of the thoracic inlet. Electronically Signed   By: Elon Alas M.D.   On: 02/07/2015 05:49     Management plans discussed with the patient, family and they are in agreement.  CODE STATUS:     Code Status Orders        Start     Ordered   02/07/15 1110  Do not attempt resuscitation (DNR)   Continuous    Question Answer Comment  In the event of cardiac or respiratory ARREST Do not call a "code blue"   In the event of cardiac or respiratory ARREST Do not perform Intubation, CPR, defibrillation or ACLS   In the event of cardiac or respiratory ARREST Use medication by any route, position, wound care, and other measures to relive pain and suffering. May use oxygen, suction and manual treatment of airway obstruction as needed for comfort.      02/07/15 1109     Advance Directive Documentation        Most Recent Value   Type of Advance Directive  Mental Health Advance Directive   Pre-existing  out of facility DNR order (yellow form or pink MOST form)     "MOST" Form in Place?        TOTAL TIME TAKING CARE OF THIS PATIENT: 40 minutes.    Jeric Slagel M.D on 02/08/2015 at 12:25 PM  Between 7am to 6pm - Pager - [phone] After 6pm go to [email or web address] - password [email or web address] [facility]  [facility]  Office  [phone]  CC: Primary care physician; Odette Fraction, MD

## 2015-02-08 NOTE — Discharge Instructions (Signed)
Soft diet

## 2015-02-08 NOTE — NC FL2 (Signed)
[place] LEVEL OF CARE SCREENING TOOL     IDENTIFICATION  Patient Name: Jeremy Terry Birthdate: 09-14-1945 Sex: male Admission Date (Current Location): 02/07/2015  [place] and [place] Number: Engineering geologist and Address:  [facility], [address], [place], [place] [address]      Provider Number: [id number]  Attending Physician Name and Address:  Fritzi Mandes, MD  Relative Name and Phone Number:       Current Level of Care: Hospital Recommended Level of Care: [place] Prior Approval Number:    Date Approved/Denied:   PASRR Number: [id number]  Discharge Plan: Home    Current Diagnoses: Patient Active Problem List   Diagnosis Date Noted  . GI bleed 02/07/2015  . Coffee ground emesis   . Reflux esophagitis   . Gastric ulcer with hemorrhage   . Orthostatic hypotension 03/08/2014  . Edema 02/11/2014  . NAION (non-arteritic anterior ischemic optic neuropathy), right eye   . Diabetic neuropathy ([place]) 08/08/2012  . Parkinson's disease ([place]) 11/13/2011  . ESSENTIAL HYPERTENSION, BENIGN 05/08/2010  . HYPERCHOLESTEROLEMIA 04/03/2010  . Essential hypertension 04/03/2010  . Coronary atherosclerosis 04/03/2010  . ASTHMA, CHILDHOOD 04/03/2010  . GERD 04/03/2010  . VENTRAL HERNIA 04/03/2010  . HIATAL HERNIA 04/03/2010  . BPH (benign prostatic hypertrophy) 04/03/2010  . DEGENERATIVE JOINT DISEASE 04/03/2010  . MEMORY LOSS 04/03/2010  . Diabetes mellitus with neurological manifestation ([place]) 04/03/2010    Orientation RESPIRATION BLADDER Height & Weight    Self  Normal Incontinent 5\' 8"  (172.7 cm) 181 lbs.  BEHAVIORAL SYMPTOMS/MOOD NEUROLOGICAL BOWEL NUTRITION STATUS      Incontinent Diet (Soft)  AMBULATORY STATUS COMMUNICATION OF NEEDS Skin   Extensive Assist Verbally Normal                       Personal Care Assistance Level of Assistance  Bathing, Feeding, Dressing, Total care Bathing Assistance:  Maximum assistance Feeding assistance: Maximum assistance Dressing Assistance: Maximum assistance Total Care Assistance: Maximum assistance   Functional Limitations Info  Speech          SPECIAL CARE FACTORS FREQUENCY                       Contractures      Additional Factors Info  Code Status, Allergies Code Status Info: DNR Allergies Info: Allergies: Morphine, Hydrocodone, Sulfa Antibiotics           Current Medications (02/08/2015):  This is the current hospital active medication list Current Facility-Administered Medications  Medication Dose Route Frequency Provider Last Rate Last Dose  . 0.9 %  sodium chloride infusion   Intravenous Continuous Fritzi Mandes, MD   Stopped at 02/08/15 1354  . acetaminophen (TYLENOL) tablet 650 mg  650 mg Oral Q6H PRN Fritzi Mandes, MD       Or  . acetaminophen (TYLENOL) suppository 650 mg  650 mg Rectal Q6H PRN Fritzi Mandes, MD      . morphine 2 MG/ML injection 1 mg  1 mg Intravenous Q4H PRN Fritzi Mandes, MD      . ondansetron (ZOFRAN) tablet 4 mg  4 mg Oral Q6H PRN Fritzi Mandes, MD       Or  . ondansetron (ZOFRAN) injection 4 mg  4 mg Intravenous Q6H PRN Fritzi Mandes, MD      . pantoprazole (PROTONIX) 80 mg in sodium chloride 0.9 % 250 mL (0.32 mg/mL) infusion  8 mg/hr Intravenous Continuous Eduard Roux  Dahlia Client, MD   Stopped at 02/08/15 1343  . pantoprazole (PROTONIX) EC tablet 40 mg  40 mg Oral BID Fritzi Mandes, MD   40 mg at 02/08/15 1353     Discharge Medications: Please see discharge summary for a list of discharge medications.  Relevant Imaging Results:  Relevant Lab Results:   Additional Information SSN:  [id number]; Will be followed by Hospice at facility  Darden Dates, LCSW

## 2015-02-08 NOTE — Plan of Care (Signed)
Problem: Safety: Goal: Ability to remain free from injury will improve Outcome: Progressing Patient sleeping throughout shift.  He did pull out IV in right hand.  New IV added to continue Protonix infusion.  IV line secured to prevent future incidents.  Bed in lowest position with wheels locked and call bell within reach.  Patient did not call for assistance. Bed alarm on throughout shift.  Patient without injury.  Problem: Pain Managment: Goal: General experience of comfort will improve Outcome: Progressing Patient assessed with PAINAD scale.  Patient scoring a 0 and resting comfortably throughout shift.    Problem: Skin Integrity: Goal: Risk for impaired skin integrity will decrease Outcome: Progressing Patient repositioned every two hours with pillow as support.  Foam on sacrum.  Heels floated to preserve skin integrity.

## 2015-02-08 NOTE — Progress Notes (Signed)
Initial Nutrition Assessment   INTERVENTION:   Meals and Snacks: Cater to patient preferences as diet order just advanced Medical Food Supplement Therapy: will recommend Mighty Shakes on meal trays TID for added nutrition (each shake provides 300kcals and 9g protein)ss   NUTRITION DIAGNOSIS:   Inadequate oral intake related to altered GI function as evidenced by  (NPO/CL this am ).  GOAL:   Patient will meet greater than or equal to 90% of their needs  MONITOR:    (Energy Intake, Anthropometrics, Digestive System)  REASON FOR ASSESSMENT:   Malnutrition Screening Tool    ASSESSMENT:   Pt admitted with upper GI bleed, s/p NG tube placement and removal in ED. Pt with h/o Parkinsons disease; pt is a feeder/total assist.   RD notes order for discharge to SNF now ordered.  Past Medical History  Diagnosis Date  . Coronary artery disease   . Hypertension   . Diabetes mellitus   . Hypercholesteremia   . BPH (benign prostatic hypertrophy)   . DJD (degenerative joint disease)   . Asthma   . Memory loss   . Ventral hernia   . GERD (gastroesophageal reflux disease)   . Allergic rhinitis   . Parkinsonian syndrome ([place])   . Hypogonadism male   . Neuropathy of leg     Bilateral  . Depression   . Parkinson disease ([place])   . NAION (non-arteritic anterior ischemic optic neuropathy), right eye     Followed by Dr. Katy Fitch  . Stroke [facility])      Diet Order:  DIET SOFT Room service appropriate?: Yes; Fluid consistency:: Thin    Current Nutrition: On visit this am CL tray on bedside tray untouched. RD aided pt with apple juice, to which pt gulped 100% and asking for more. CNA Edwin Dada came in to assist pt with rest of tray and reported later pt had eaten and tolerated 100% of CL meal.  Food/Nutrition-Related History: Unable to clarify with pt on visit.    Scheduled Medications:  . pantoprazole  40 mg Oral BID    Continuous Medications:  . sodium chloride 50 mL/hr at 02/08/15  0755  . pantoprozole (PROTONIX) infusion 8 mg/hr (02/08/15 1013)     Electrolyte/Renal Profile and Glucose Profile:   Recent Labs Lab 02/07/15 0503 02/08/15 0506  NA 136 143  K 3.6 3.5  CL 99* 115*  CO2 26 24  BUN 33* 16  CREATININE 1.03 0.79  CALCIUM 8.9 8.3*  GLUCOSE 219* 138*   Protein Profile:  Recent Labs Lab 02/07/15 0503  ALBUMIN 4.0    Gastrointestinal Profile: Last BM:  02/08/2015   Nutrition-Focused Physical Exam Findings:  Unable to complete Nutrition-Focused physical exam at this time.    Weight Change: Unable to clarify per [facility] weight loss of 15% since August 2016 (4 months)   Height:   Ht Readings from Last 1 Encounters:  02/07/15 5\' 8"  (1.727 m)    Weight:   Wt Readings from Last 1 Encounters:  02/07/15 181 lb 1.6 oz (82.146 kg)    Wt Readings from Last 10 Encounters:  02/07/15 181 lb 1.6 oz (82.146 kg)  10/07/14 213 lb (96.616 kg)  08/23/14 215 lb 9 oz (97.779 kg)  08/03/14 235 lb (106.595 kg)  07/13/14 222 lb (100.699 kg)  03/30/14 240 lb (108.863 kg)  03/18/14 238 lb (107.956 kg)  03/08/14 238 lb (107.956 kg)  02/11/14 243 lb 1.9 oz (110.279 kg)  01/05/14 244 lb (110.678 kg)    BMI:  Body mass index is 27.54 kg/(m^2).   EDUCATION NEEDS:   Education needs no appropriate at this time   [place], [place], LDN Pager [phone]

## 2015-02-08 NOTE — Progress Notes (Signed)
Pt for discharge  Back to wom via ems. Alert. Iv sites x 2 d/cd.  Ems called to transport.  Report caller to [employer] at [facility].  Pt will still be under hospice care.

## 2015-02-09 ENCOUNTER — Encounter: Payer: Self-pay | Admitting: Gastroenterology

## 2015-02-22 ENCOUNTER — Inpatient Hospital Stay: Payer: PRIVATE HEALTH INSURANCE | Admitting: Family Medicine

## 2015-03-15 ENCOUNTER — Ambulatory Visit: Payer: Medicare Other | Admitting: Gastroenterology

## 2015-04-05 ENCOUNTER — Ambulatory Visit (INDEPENDENT_AMBULATORY_CARE_PROVIDER_SITE_OTHER): Payer: Medicare Other | Admitting: Gastroenterology

## 2015-04-05 ENCOUNTER — Other Ambulatory Visit: Payer: Self-pay

## 2015-04-05 ENCOUNTER — Ambulatory Visit: Payer: Medicare Other | Admitting: Gastroenterology

## 2015-04-05 ENCOUNTER — Encounter: Payer: Self-pay | Admitting: Gastroenterology

## 2015-04-05 VITALS — BP 115/71 | HR 94 | Ht 70.0 in | Wt 163.0 lb

## 2015-04-05 DIAGNOSIS — K922 Gastrointestinal hemorrhage, unspecified: Secondary | ICD-10-CM | POA: Diagnosis not present

## 2015-04-05 NOTE — Progress Notes (Signed)
Primary Care Physician: Odette Fraction, MD  Primary Gastroenterologist:  Dr. Lucilla Lame  Chief Complaint  Patient presents with  . Hospitalization Follow-up    GI Bleed    HPI: Jeremy Terry is a 70 y.o. male here  After being discharged for an upper GI bleed. The patient had an upper endoscopy with clipping of a visible vessel and ulcer. The patient has had no further black stools or bloody stools as reported by his wife. The patient is mostly nonverbal due to advanced Parkinson's disease.  Current Outpatient Prescriptions  Medication Sig Dispense Refill  . albuterol (PROVENTIL HFA;VENTOLIN HFA) 108 (90 BASE) MCG/ACT inhaler Inhale 2 puffs into the lungs every 6 (six) hours as needed for wheezing or shortness of breath. 1 Inhaler 0  . aspirin 81 MG chewable tablet Chew 81 mg by mouth daily.    Marland Kitchen buPROPion (WELLBUTRIN) 100 MG tablet Take 100 mg by mouth 3 (three) times daily.    . clopidogrel (PLAVIX) 75 MG tablet TAKE 1 TABLET BY MOUTH DAILY    . donepezil (ARICEPT) 10 MG tablet Take 1 tablet (10 mg total) by mouth at bedtime. 90 tablet 3  . glipiZIDE (GLUCOTROL) 5 MG tablet TAKE 1 TABLET BY MOUTH TWICE A DAY BEFORE MEALS (NOT COVERED BY HOSPICE) 180 tablet 0  . hydrOXYzine (ATARAX/VISTARIL) 25 MG tablet Take 1 or 2 tablets by mouth every 8 hours as needed as needed    . losartan (COZAAR) 100 MG tablet TAKE 1 TABLET BY MOUTH EVERY DAY 30 tablet 11  . mirtazapine (REMERON) 15 MG tablet Take 1 tablet (15 mg total) by mouth at bedtime. (Patient taking differently: Take 7.5 mg by mouth at bedtime. ) 30 tablet 5  . Multiple Vitamin (MULTIVITAMIN) capsule Take 1 capsule by mouth daily.      . nitroGLYCERIN (NITROSTAT) 0.4 MG SL tablet Place 1 tablet (0.4 mg total) under the tongue every 5 (five) minutes as needed. 25 tablet 11  . ondansetron (ZOFRAN ODT) 4 MG disintegrating tablet Take 1 tablet (4 mg total) by mouth every 8 (eight) hours as needed for nausea or vomiting. 20 tablet 0  .  pantoprazole (PROTONIX) 40 MG tablet Take 1 tablet (40 mg total) by mouth 2 (two) times daily. 60 tablet 2  . potassium chloride (K-DUR,KLOR-CON) 10 MEQ tablet Take 10 mEq by mouth 2 (two) times daily.    Marland Kitchen RYTARY 23.75-95 MG CPCR TAKE 4 TABS IN THE AM, 4 TABS IN THE AFTERNOON, AND 3 TABS IN THE EVENING 330 capsule 5  . senna (SENOKOT) 8.6 MG TABS tablet Take 1 tablet by mouth daily.    Marland Kitchen oxyCODONE-acetaminophen (PERCOCET/ROXICET) 5-325 MG per tablet Take 1 tablet by mouth every 6 (six) hours as needed for severe pain. (Patient not taking: Reported on 04/05/2015) 20 tablet 0  . torsemide (DEMADEX) 20 MG tablet Take 1 tablet (20 mg total) by mouth 2 (two) times daily. (Patient not taking: Reported on 04/05/2015) 60 tablet 5  . [DISCONTINUED] rOPINIRole (REQUIP) 1 MG tablet Take 1 mg by mouth 3 (three) times daily.      No current facility-administered medications for this visit.    Allergies as of 04/05/2015 - Review Complete 02/07/2015  Allergen Reaction Noted  . Morphine Hives   . Hydrocodone Rash   . Sulfa antibiotics Rash 10/31/2012    ROS:  General: Negative for anorexia, weight loss, fever, chills, fatigue, weakness. ENT: Negative for hoarseness, difficulty swallowing , nasal congestion. CV: Negative for chest pain,  angina, palpitations, dyspnea on exertion, peripheral edema.  Respiratory: Negative for dyspnea at rest, dyspnea on exertion, cough, sputum, wheezing.  GI: See history of present illness. GU:  Negative for dysuria, hematuria, urinary incontinence, urinary frequency, nocturnal urination.  Endo: Negative for unusual weight change.    Physical Examination:   BP 115/71 mmHg  Pulse 94  Ht 5\' 10"  (1.778 m)  Wt 163 lb (73.936 kg)  BMI 23.39 kg/m2  General: Well-nourished, well-developed in no acute distress.  Eyes: No icterus. Conjunctivae pink. Lungs: Clear to auscultation bilaterally. Non-labored. Heart: Regular rate and rhythm, no murmurs rubs or gallops.  Abdomen:  Bowel sounds are normal, nontender, nondistended, no hepatosplenomegaly or masses, no abdominal bruits or hernia , no rebound or guarding.   Extremities: No lower extremity edema. No clubbing or deformities.   Labs:    Imaging Studies: No results found.  Assessment and Plan:   Jeremy Terry is a 70 y.o. y/o male  comes for follow-up after being discharged from the hospital with an upper GI bleed. The patient hasn't had he further signs of GI bleeding. The patient will follow up as needed.   Note: This dictation was prepared with Dragon dictation along with smaller phrase technology. Any transcriptional errors that result from this process are unintentional.

## 2015-04-09 ENCOUNTER — Other Ambulatory Visit: Payer: Self-pay | Admitting: Family Medicine

## 2015-04-15 ENCOUNTER — Other Ambulatory Visit
Admission: RE | Admit: 2015-04-15 | Discharge: 2015-04-15 | Disposition: A | Discharge status: Home / Self Care | Payer: Medicare Other | Source: Ambulatory Visit | Attending: Family Medicine | Admitting: Family Medicine

## 2015-04-15 DIAGNOSIS — R4182 Altered mental status, unspecified: Secondary | ICD-10-CM | POA: Diagnosis present

## 2015-04-15 DIAGNOSIS — R509 Fever, unspecified: Secondary | ICD-10-CM | POA: Diagnosis not present

## 2015-04-15 LAB — BASIC METABOLIC PANEL
ANION GAP: 11 (ref 5–15)
BUN: 64 mg/dL — ABNORMAL HIGH (ref 6–20)
CALCIUM: 9.4 mg/dL (ref 8.9–10.3)
CO2: 28 mmol/L (ref 22–32)
Chloride: 118 mmol/L — ABNORMAL HIGH (ref 101–111)
Creatinine, Ser: 3.03 mg/dL — ABNORMAL HIGH (ref 0.61–1.24)
GFR, EST AFRICAN AMERICAN: 23 mL/min — AB (ref >60)
GFR, EST NON AFRICAN AMERICAN: 19 mL/min — AB (ref >60)
Glucose, Bld: 203 mg/dL — ABNORMAL HIGH (ref 65–99)
Potassium: 3.5 mmol/L (ref 3.5–5.1)
SODIUM: 157 mmol/L — AB (ref 135–145)

## 2015-04-15 LAB — CBC WITH DIFFERENTIAL/PLATELET
BASOS ABS: 0.1 10*3/uL (ref 0–0.1)
BASOS PCT: 0 %
EOS PCT: 0 %
Eosinophils Absolute: 0 10*3/uL (ref 0–0.7)
HCT: 45.2 % (ref 40.0–52.0)
Hemoglobin: 14.8 g/dL (ref 13.0–18.0)
Lymphocytes Relative: 5 %
Lymphs Abs: 1.3 10*3/uL (ref 1.0–3.6)
MCH: 32.4 pg (ref 26.0–34.0)
MCHC: 32.7 g/dL (ref 32.0–36.0)
MCV: 99 fL (ref 80.0–100.0)
MONO ABS: 1.1 10*3/uL — AB (ref 0.2–1.0)
Monocytes Relative: 4 %
Neutro Abs: 25.6 10*3/uL — ABNORMAL HIGH (ref 1.4–6.5)
Neutrophils Relative %: 91 %
PLATELETS: 229 10*3/uL (ref 150–440)
RBC: 4.57 MIL/uL (ref 4.40–5.90)
RDW: 13.9 % (ref 11.5–14.5)
WBC: 28.1 10*3/uL — AB (ref 3.8–10.6)

## 2015-04-27 DEATH — deceased

## 2015-05-05 ENCOUNTER — Ambulatory Visit: Payer: PRIVATE HEALTH INSURANCE | Admitting: Neurology

## 2016-01-27 IMAGING — CT CT HEAD W/O CM
2 series · 16 of 30 positions shown, 18 images · non-contrast
Comparison: MRI 12/31/2013.

CLINICAL DATA: Parkinson's syndrome. Post fall with no loss of
consciousness. Injury to top of head.

EXAM:
CT HEAD WITHOUT CONTRAST
TECHNIQUE: Contiguous axial images were obtained from the base of the skull
through the vertex without intravenous contrast.

[Series 201: head w/o, idose (1) · axial · non-contrast · 0.46mm/px · z∈[+82,+202]mm · 8 of 32 slices shown, 10 images]
[im 4/32  brain]
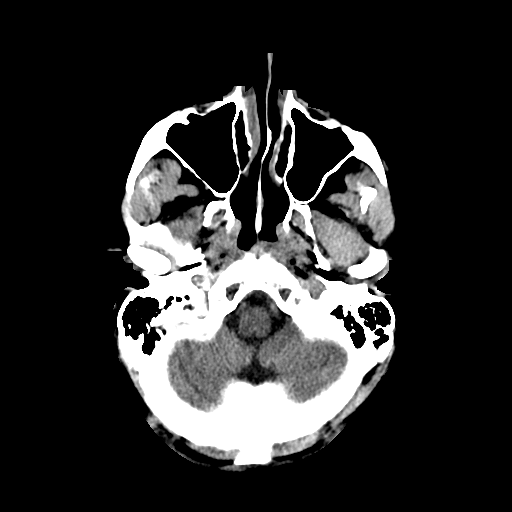
[im 4/32  bone]
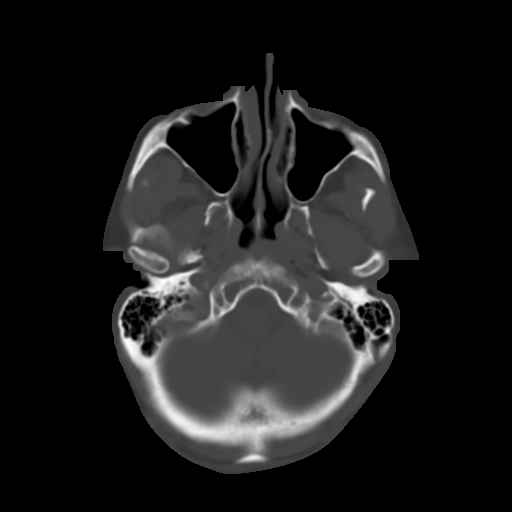
[im 7/32  brain]
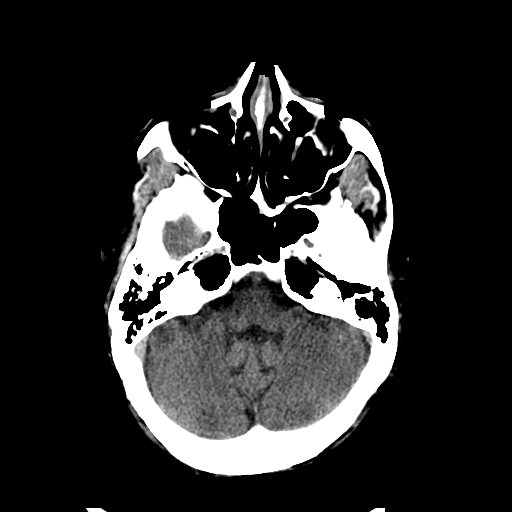
[im 11/32  brain]
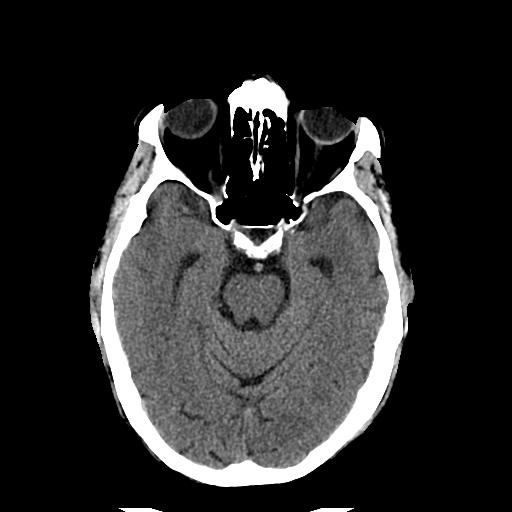
[im 14/32  brain]
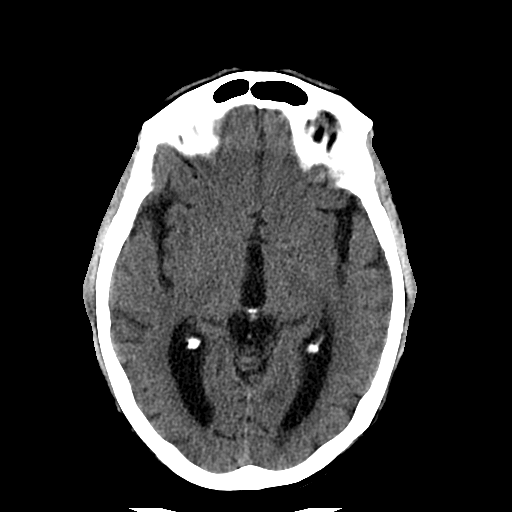
[im 18/32  brain]
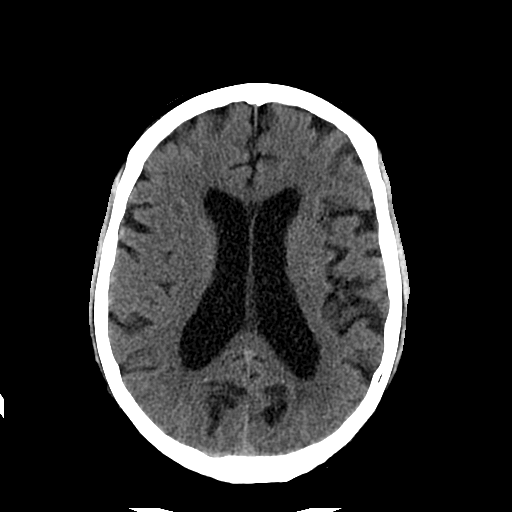
[im 18/32  bone]
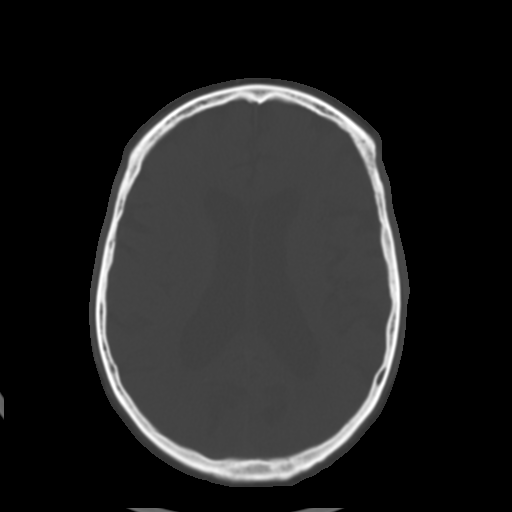
[im 21/32  brain]
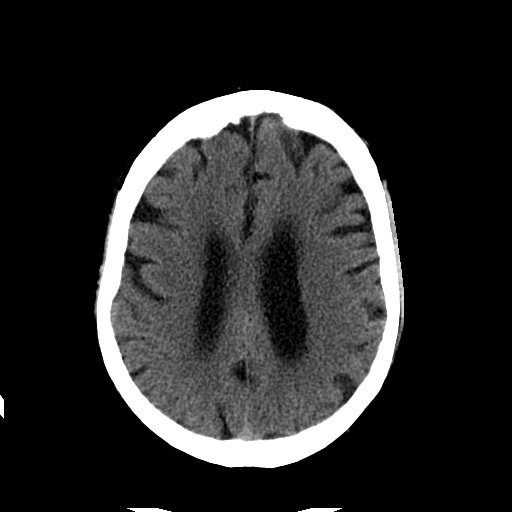
[im 25/32  brain]
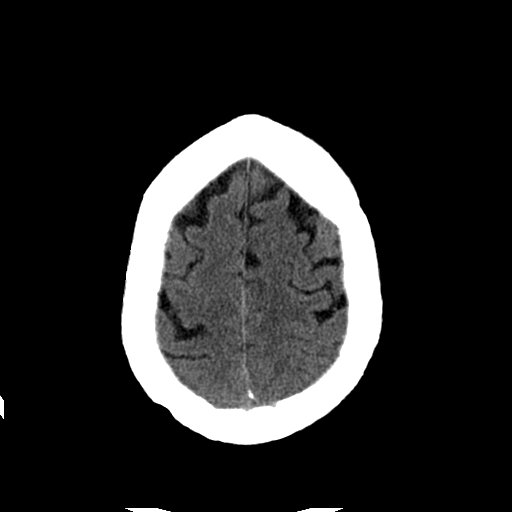
[im 28/32  brain]
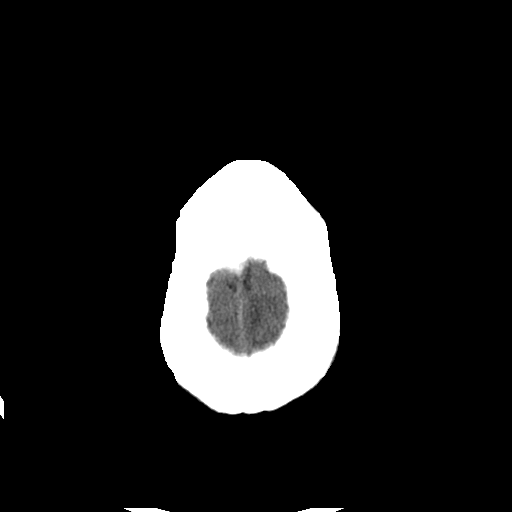

[Series 202: head w/o bone, idose (1) · axial · non-contrast · 0.46mm/px · z∈[+81,+206]mm · 8 of 64 slices shown]
[im 7/64  bone]
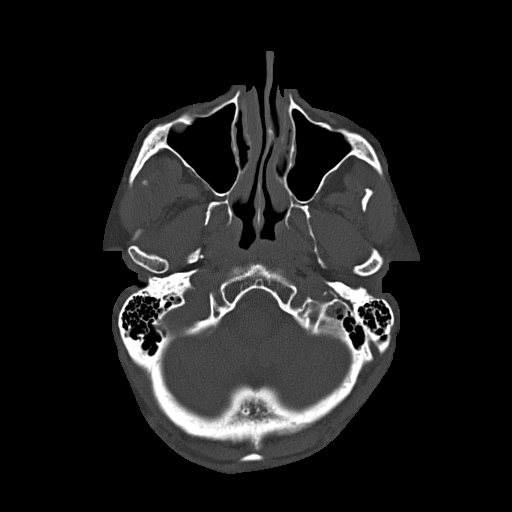
[im 14/64  bone]
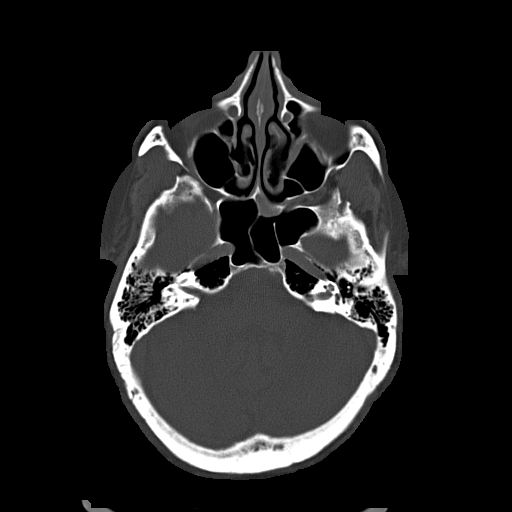
[im 20/64  bone]
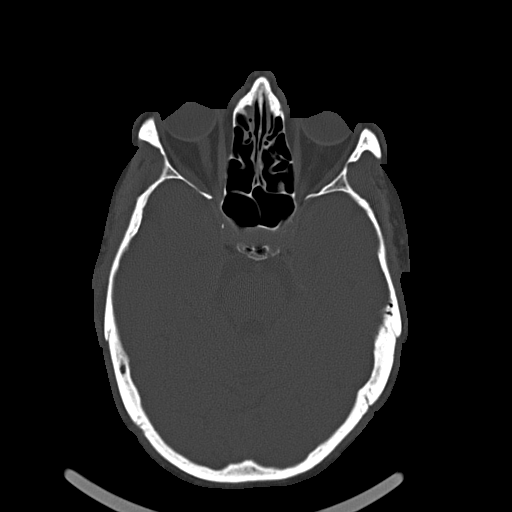
[im 27/64  bone]
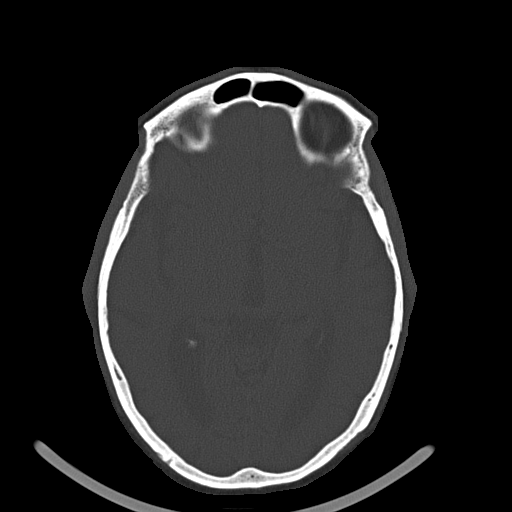
[im 37/64  bone]
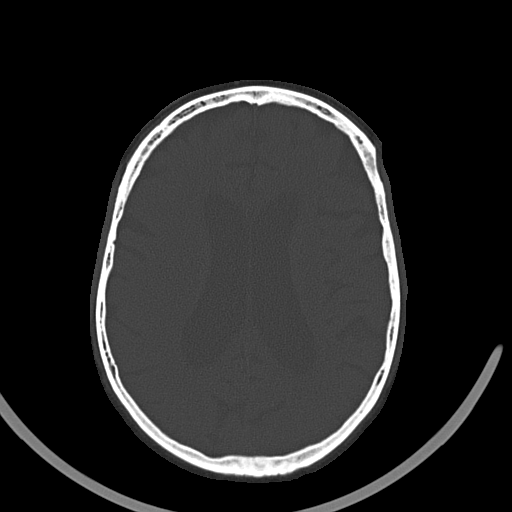
[im 44/64  bone]
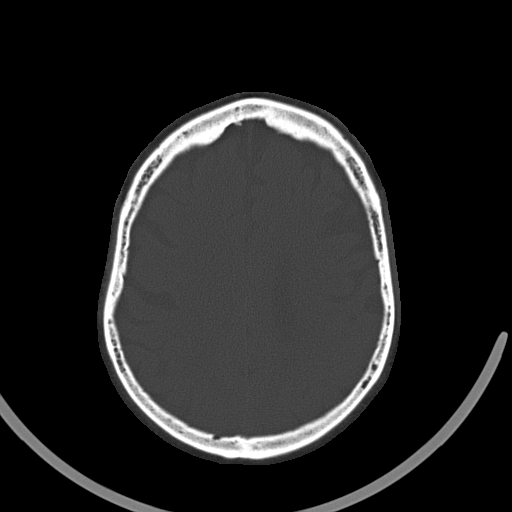
[im 50/64  bone]
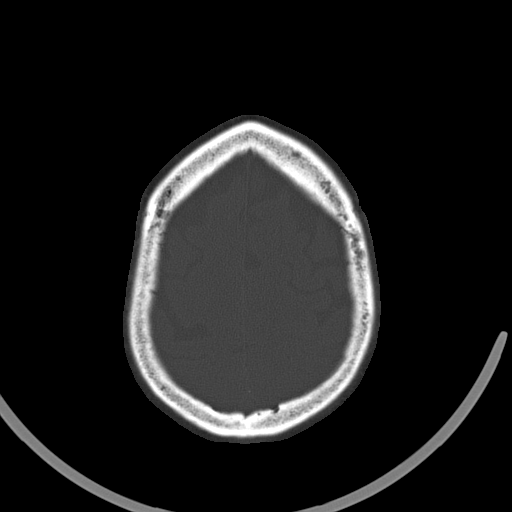
[im 57/64  bone]
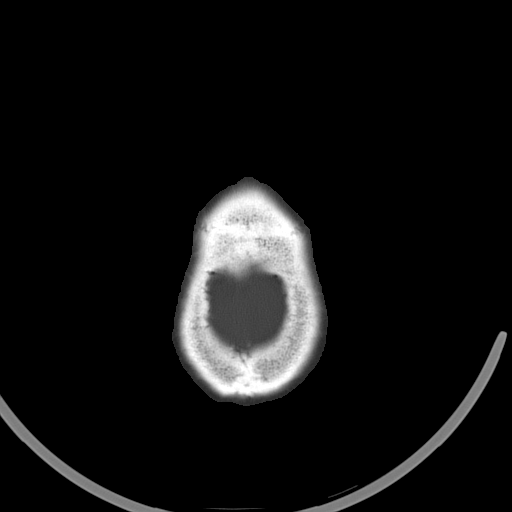

[16 of 30 positions shown; findings below may reference images not displayed]

FINDINGS: Ventricles, cisterns and other CSF spaces are within normal. There
is no mass, mass effect, shift of midline structures or acute
hemorrhage. No evidence of acute infarction. Remaining bones and
soft tissues are unremarkable.
IMPRESSION: No acute intracranial findings.

## 2017-01-01 IMAGING — CR DG CHEST 1V PORT
1 series · 2 of 2 positions shown · non-contrast
Comparison: Chest radiograph April 25, 2013.

CLINICAL DATA: Coffee ground emesis. Hypoxia. History of
hypertension diabetes.

EXAM:
PORTABLE CHEST 1 VIEW

[Series 1: portable · 0.17mm/px · 2 of 2 slices shown]
[im 1/2]
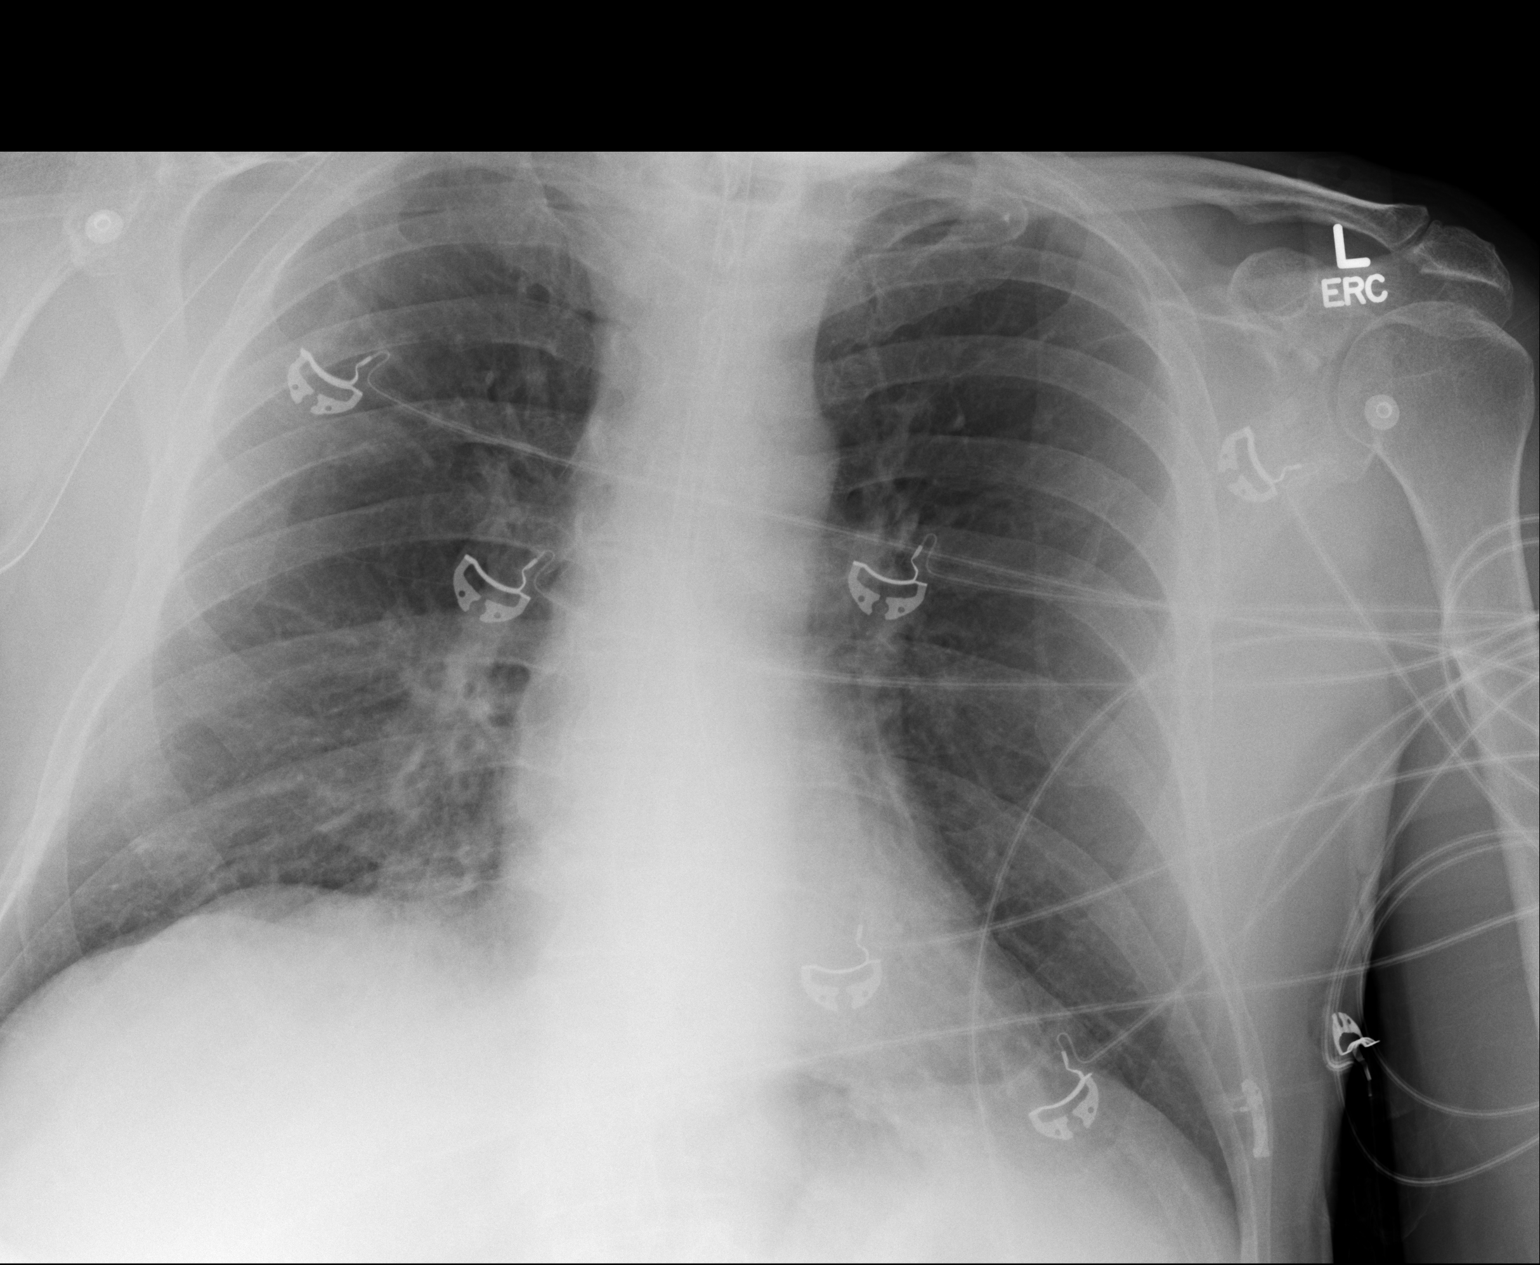
[im 2/2]
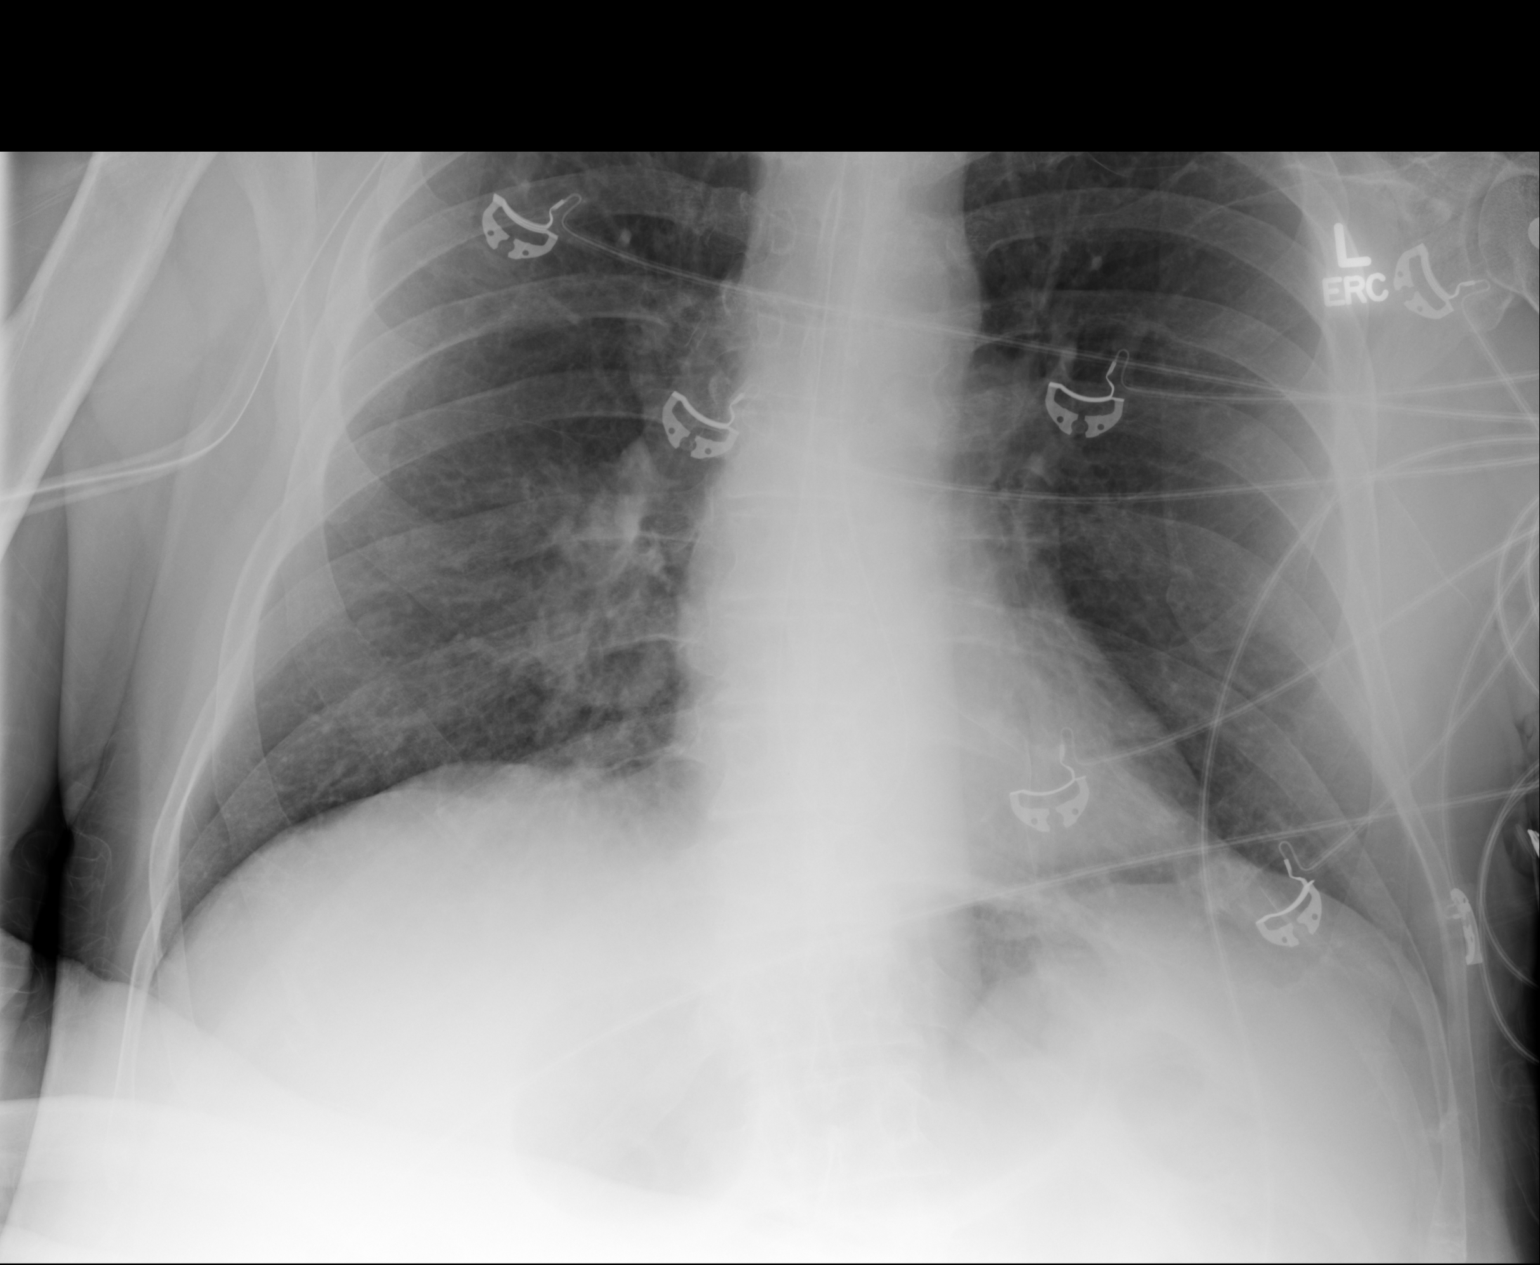

[2 of 2 positions shown; findings below may reference images not displayed]

FINDINGS: Cardiomediastinal silhouette is normal. The lungs are clear without
pleural effusions or focal consolidations. Trachea projects midline
and there is no pneumothorax. Soft tissue planes and included
osseous structures are non-suspicious. Nasogastric tube looped in
the distribution of the distal esophagus, distal tip not identified,
above the level of the thoracic inlet.
IMPRESSION: No acute cardiopulmonary process.

Nasogastric tube looped in distal esophageal region, coursing above
the level of the thoracic inlet.
